# Patient Record
Sex: Female | Born: 1950 | Race: White | Hispanic: No | Marital: Married | Smoking: Never smoker
Health system: Southern US, Community
[De-identification: ages and names within clinical notes are randomized; demographics above are authoritative.]

## PROBLEM LIST (undated history)

## (undated) DIAGNOSIS — C801 Malignant (primary) neoplasm, unspecified: Secondary | ICD-10-CM

## (undated) DIAGNOSIS — K219 Gastro-esophageal reflux disease without esophagitis: Secondary | ICD-10-CM

## (undated) DIAGNOSIS — F32A Depression, unspecified: Secondary | ICD-10-CM

## (undated) DIAGNOSIS — Z87898 Personal history of other specified conditions: Secondary | ICD-10-CM

## (undated) DIAGNOSIS — E236 Other disorders of pituitary gland: Secondary | ICD-10-CM

## (undated) DIAGNOSIS — E119 Type 2 diabetes mellitus without complications: Principal | ICD-10-CM

## (undated) DIAGNOSIS — T7840XA Allergy, unspecified, initial encounter: Secondary | ICD-10-CM

## (undated) DIAGNOSIS — C4491 Basal cell carcinoma of skin, unspecified: Secondary | ICD-10-CM

## (undated) DIAGNOSIS — D649 Anemia, unspecified: Secondary | ICD-10-CM

## (undated) DIAGNOSIS — J449 Chronic obstructive pulmonary disease, unspecified: Secondary | ICD-10-CM

## (undated) DIAGNOSIS — F419 Anxiety disorder, unspecified: Secondary | ICD-10-CM

## (undated) DIAGNOSIS — N3281 Overactive bladder: Secondary | ICD-10-CM

## (undated) DIAGNOSIS — E039 Hypothyroidism, unspecified: Secondary | ICD-10-CM

## (undated) HISTORY — PX: ABDOMINAL HYSTERECTOMY: SHX81

## (undated) HISTORY — DX: Gastro-esophageal reflux disease without esophagitis: K21.9

## (undated) HISTORY — DX: Overactive bladder: N32.81

## (undated) HISTORY — DX: Hypothyroidism, unspecified: E03.9

## (undated) HISTORY — DX: Allergy, unspecified, initial encounter: T78.40XA

## (undated) HISTORY — DX: Chronic obstructive pulmonary disease, unspecified: J44.9

## (undated) HISTORY — DX: Other disorders of pituitary gland: E23.6

## (undated) HISTORY — DX: Personal history of other specified conditions: Z87.898

## (undated) HISTORY — DX: Basal cell carcinoma of skin, unspecified: C44.91

## (undated) HISTORY — DX: Type 2 diabetes mellitus without complications: E11.9

---

## 2003-10-22 ENCOUNTER — Other Ambulatory Visit: Payer: Self-pay

## 2003-10-25 HISTORY — PX: ABDOMINAL HYSTERECTOMY: SHX81

## 2003-10-25 HISTORY — PX: MOUTH SURGERY: SHX715

## 2004-06-07 ENCOUNTER — Encounter: Admission: RE | Admit: 2004-06-07 | Discharge: 2004-06-07 | Payer: Self-pay | Admitting: Neurosurgery

## 2004-09-27 ENCOUNTER — Encounter: Admission: RE | Admit: 2004-09-27 | Discharge: 2004-09-27 | Payer: Self-pay | Admitting: Neurosurgery

## 2004-10-30 ENCOUNTER — Encounter: Payer: Self-pay | Admitting: Family Medicine

## 2005-09-28 ENCOUNTER — Encounter: Admission: RE | Admit: 2005-09-28 | Discharge: 2005-09-28 | Payer: Self-pay | Admitting: Neurosurgery

## 2006-11-06 ENCOUNTER — Encounter: Admission: RE | Admit: 2006-11-06 | Discharge: 2006-11-06 | Payer: Self-pay | Admitting: Neurosurgery

## 2008-01-25 ENCOUNTER — Encounter: Payer: Self-pay | Admitting: Family Medicine

## 2008-01-26 ENCOUNTER — Encounter: Payer: Self-pay | Admitting: Family Medicine

## 2008-01-26 LAB — CONVERTED CEMR LAB
HDL: 59 mg/dL
LDL Cholesterol: 117 mg/dL

## 2008-07-07 ENCOUNTER — Encounter: Payer: Self-pay | Admitting: Family Medicine

## 2008-07-31 ENCOUNTER — Ambulatory Visit: Payer: Self-pay | Admitting: Family Medicine

## 2008-07-31 DIAGNOSIS — D352 Benign neoplasm of pituitary gland: Secondary | ICD-10-CM | POA: Insufficient documentation

## 2008-07-31 DIAGNOSIS — I10 Essential (primary) hypertension: Secondary | ICD-10-CM | POA: Insufficient documentation

## 2008-07-31 DIAGNOSIS — K029 Dental caries, unspecified: Secondary | ICD-10-CM | POA: Insufficient documentation

## 2008-07-31 DIAGNOSIS — J449 Chronic obstructive pulmonary disease, unspecified: Secondary | ICD-10-CM | POA: Insufficient documentation

## 2008-07-31 DIAGNOSIS — K219 Gastro-esophageal reflux disease without esophagitis: Secondary | ICD-10-CM | POA: Insufficient documentation

## 2008-07-31 DIAGNOSIS — J4489 Other specified chronic obstructive pulmonary disease: Secondary | ICD-10-CM | POA: Insufficient documentation

## 2008-07-31 DIAGNOSIS — D353 Benign neoplasm of craniopharyngeal duct: Secondary | ICD-10-CM

## 2008-07-31 DIAGNOSIS — J309 Allergic rhinitis, unspecified: Secondary | ICD-10-CM | POA: Insufficient documentation

## 2008-07-31 DIAGNOSIS — J45909 Unspecified asthma, uncomplicated: Secondary | ICD-10-CM | POA: Insufficient documentation

## 2008-07-31 DIAGNOSIS — E039 Hypothyroidism, unspecified: Secondary | ICD-10-CM | POA: Insufficient documentation

## 2008-07-31 HISTORY — DX: Benign neoplasm of pituitary gland: D35.2

## 2008-08-04 LAB — CONVERTED CEMR LAB
BUN: 11 mg/dL (ref 6–23)
CO2: 29 meq/L (ref 19–32)
Calcium: 9.6 mg/dL (ref 8.4–10.5)
Chloride: 101 meq/L (ref 96–112)
Creatinine, Ser: 0.7 mg/dL (ref 0.4–1.2)
Free T4: 0.8 ng/dL (ref 0.6–1.6)
GFR calc Af Amer: 112 mL/min
GFR calc non Af Amer: 92 mL/min
Glucose, Bld: 105 mg/dL — ABNORMAL HIGH (ref 70–99)
Potassium: 3.6 meq/L (ref 3.5–5.1)
Sodium: 139 meq/L (ref 135–145)
T4, Total: 10.8 ug/dL (ref 5.0–12.5)
TSH: 3.42 microintl units/mL (ref 0.35–5.50)

## 2008-08-11 ENCOUNTER — Encounter: Payer: Self-pay | Admitting: Family Medicine

## 2008-10-21 ENCOUNTER — Ambulatory Visit: Payer: Self-pay | Admitting: Unknown Physician Specialty

## 2008-10-28 ENCOUNTER — Ambulatory Visit: Payer: Self-pay | Admitting: Unknown Physician Specialty

## 2008-12-02 ENCOUNTER — Ambulatory Visit: Payer: Self-pay | Admitting: Unknown Physician Specialty

## 2009-01-14 ENCOUNTER — Ambulatory Visit: Payer: Self-pay | Admitting: Family Medicine

## 2009-07-17 ENCOUNTER — Encounter: Payer: Self-pay | Admitting: Family Medicine

## 2009-08-25 ENCOUNTER — Ambulatory Visit: Payer: Self-pay | Admitting: Family Medicine

## 2009-08-25 DIAGNOSIS — L259 Unspecified contact dermatitis, unspecified cause: Secondary | ICD-10-CM | POA: Insufficient documentation

## 2009-08-31 ENCOUNTER — Encounter: Payer: Self-pay | Admitting: Family Medicine

## 2010-03-29 ENCOUNTER — Ambulatory Visit: Payer: Self-pay | Admitting: Family Medicine

## 2010-03-29 DIAGNOSIS — M79609 Pain in unspecified limb: Secondary | ICD-10-CM | POA: Insufficient documentation

## 2010-03-31 ENCOUNTER — Ambulatory Visit: Payer: Self-pay | Admitting: Family Medicine

## 2010-06-11 ENCOUNTER — Encounter (INDEPENDENT_AMBULATORY_CARE_PROVIDER_SITE_OTHER): Payer: Self-pay | Admitting: *Deleted

## 2010-08-05 ENCOUNTER — Ambulatory Visit: Payer: Self-pay | Admitting: Internal Medicine

## 2010-08-05 LAB — CONVERTED CEMR LAB
Bilirubin Urine: NEGATIVE
Blood in Urine, dipstick: NEGATIVE
Glucose, Urine, Semiquant: NEGATIVE
Ketones, urine, test strip: NEGATIVE
Nitrite: NEGATIVE
Protein, U semiquant: NEGATIVE
Specific Gravity, Urine: 1.01
Urobilinogen, UA: 0.2
WBC Urine, dipstick: NEGATIVE
pH: 6.5

## 2010-08-06 ENCOUNTER — Encounter: Payer: Self-pay | Admitting: Family Medicine

## 2010-10-01 ENCOUNTER — Ambulatory Visit: Payer: Self-pay | Admitting: Internal Medicine

## 2010-10-01 LAB — CONVERTED CEMR LAB: Rapid Strep: NEGATIVE

## 2010-11-08 ENCOUNTER — Ambulatory Visit
Admission: RE | Admit: 2010-11-08 | Discharge: 2010-11-08 | Payer: Self-pay | Source: Home / Self Care | Attending: Family Medicine | Admitting: Family Medicine

## 2010-11-23 NOTE — Assessment & Plan Note (Signed)
Summary: ST,COUGH/CLE   Vital Signs:  Patient profile:   60 year old female Height:      60.25 inches Weight:      185.38 pounds BMI:     36.03 Temp:     100.0 degrees F oral Pulse rate:   77 / minute Pulse rhythm:   regular BP sitting:   150 / 90  Vitals Entered ByMelody Comas (October 01, 2010 3:14 PM) CC: sore throat, cough   History of Present Illness: CC: ST, cough  3d h/o ST, hoarseness and cough that doesn't stop.  Feels awful.  Today took turn for worse.  Today temp in clinic 100.  + nausea prior to feeling bad, diarrhea.  + dull frontal HA.  Takes zyrtec D.  Feels like needs to cough up sputum.  Sleeping in chair, not sleeping well.  No fevers/chills, abd pain, vomiting, rashes, myalgias, arthralgias.  + husband sick as well - Dr. Alphonsus Sias recently gave abx.  No smokers at home.  No h/o asthma.  gets bronchitis 1-2x/year.  Current Medications (verified): 1)  Levothyroxine Sodium 50 Mcg Tabs (Levothyroxine Sodium) .... Take One By Mouth Daily 2)  Bisoprolol-Hydrochlorothiazide 5-6.25 Mg Tabs (Bisoprolol-Hydrochlorothiazide) .... Take One By Mouth Daily 3)  Premarin 0.3 Mg Tabs (Estrogens Conjugated) .... Take One By Mouth Daily 4)  Detrol La 4 Mg Xr24h-Cap (Tolterodine Tartrate) .... Take One By Mouth Daily 5)  Vitamin D (Ergocalciferol) 50000 Unit Caps (Ergocalciferol) .... Take One Tablet Every Month  Allergies: 1)  ! 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 Talmage Nap, Deboraha Sprang, Endo (saw once) [facility]  Social History: Last updated: 07/31/2008 Marital Status: Married, Pharmacist, hospital Children:  Occupation: [employer], [employer]    Review of Systems       per HPI  Physical Exam  General:  overweight appearing female in NAD, congested and  cough Head:  no maxillary sinus ttp  Eyes:  No corneal or conjunctival inflammation noted. EOMI. Perrla. Ears:  clear B TMs, clear ext canals. Nose:  no external deformity and nasal discharge mucosal pallor.   Mouth:  mmm, pharyngeal erythema no exudates.   Neck:  no cervical or supraclavicular lymphadenopathy Lungs:  Normal respiratory effort, chest expands symmetrically. Lungs are clear to auscultation, no crackles or wheezes. Heart:  Normal rate and regular rhythm. S1 and S2 normal without gallop, murmur, click, rub or other extra sounds. Pulses:  2+ rad pulses Extremities:  no edema   Impression & Recommendations:  Problem # 1:  URI (ICD-465.9) supportive care as per instructions.  red flags to return discussed. to call next week if not better with below measures for consideration of a zpack.  The following medications were removed from the medication list:    Promethazine Hcl 25 Mg Tabs (Promethazine hcl) .[name] Kitchen... Take one by mouth as needed nausea Her updated medication list for this problem includes:    Tussionex Pennkinetic Er 10-8 Mg/61ml Lqcr (Hydrocod polst-chlorphen polst) ..... One teaspoon two times a day as needed cough  Complete Medication List: 1)  Levothyroxine Sodium 50 Mcg Tabs (Levothyroxine sodium) .... Take one by mouth daily 2)  Bisoprolol-hydrochlorothiazide 5-6.25 Mg Tabs (Bisoprolol-hydrochlorothiazide) .... Take one by mouth daily 3)  Premarin 0.3 Mg Tabs (Estrogens conjugated) .... Take one by mouth daily 4)  Detrol La 4 Mg Xr24h-cap (Tolterodine tartrate) .[name] KitchenMarland KitchenMarland Kitchen  Take one by mouth daily 5)  Vitamin D (ergocalciferol) 50000 Unit Caps (Ergocalciferol) .... Take one tablet every month 6)  Tussionex Pennkinetic Er 10-8 Mg/39ml Lqcr (Hydrocod polst-chlorphen polst) .... One teaspoon two times a day as needed cough  Patient Instructions: 1)  Sounds like you have a viral upper respiratory infection. 2)  Antibiotics are not needed for this.  Viral infections usually  take 7-10 days to resolve.  The cough can last 4 weeks to go away. 3)  Guaifenesin IR 400mg  one pill in am and one at noon, must take wit hplenty of fluid to mobilize mucous out.[name] Kitchen 4)  Nasal saline for dry nose. 5)  Ibuprofen for throat inflammation. 6)  Use medication as prescribed: tussionex 7)  Please return if you are not improving as expected, or if you have high fevers (>101.5) or difficulty swallowing. 8)  Call clinic with questions.  Pleasure to see you today!  Prescriptions: Sandria Senter ER 10-8 MG/5ML LQCR (HYDROCOD POLST-CHLORPHEN POLST) one teaspoon two times a day as needed cough  #100cc x 0   Entered and Authorized by:   Eustaquio Boyden  MD   Signed by:   Eustaquio Boyden  MD on 10/01/2010   Method used:   Print then Give to Patient   RxID:   [id number]    Orders Added: 1)  Est. Patient Level III [27741]    Current Allergies (reviewed today): ! SULFA  Laboratory Results  Date/Time Received: October 01, 2010 3:16 PM  Date/Time Reported: October 01, 2010 3:16 PM  Other Tests  Rapid Strep: negative

## 2010-11-23 NOTE — Assessment & Plan Note (Signed)
Summary: ? UTI   Vital Signs:  Patient profile:   60 year old female Weight:      185.25 pounds Temp:     98.4 degrees F oral Pulse rate:   80 / minute Pulse rhythm:   regular BP sitting:   144 / 80  (left arm) Cuff size:   large  Vitals Entered By: Selena Batten Dance CMA Duncan Dull) (August 05, 2010 3:47 PM) CC: ? UTI/generally feels "awful" Comments Experiencing urinary frequency. Had 3 left over Cipro pills. Took those and feels generally worse.   History of Present Illness: CC: ?UTI  Frequency started 2 nights ago.  No dysuria.  No urgency.  + stomach upset, nausea with anything she eats.  + pressure.  No vag discharge.  No back pain, fevers/chills.  had 3 cipro at home, took for last 1 1/2 days.  GYN prescribes cipro for "emergency UTIs", gets 2-3/year.  No h/o kidney stones, hasn't seen any blood in urine.  Current Medications (verified): 1)  Levothyroxine Sodium 50 Mcg Tabs (Levothyroxine Sodium) .... Take One By Mouth Daily 2)  Bisoprolol-Hydrochlorothiazide 5-6.25 Mg Tabs (Bisoprolol-Hydrochlorothiazide) .... Take One By Mouth Daily 3)  Premarin 0.3 Mg Tabs (Estrogens Conjugated) .... Take One By Mouth Daily 4)  Detrol La 4 Mg Xr24h-Cap (Tolterodine Tartrate) .... Take One By Mouth Daily 5)  Vitamin D (Ergocalciferol) 50000 Unit Caps (Ergocalciferol) .... Take One Tablet Every Month  Allergies: 1)  ! 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 Lungs are clear to auscultation, no crackles or wheezes. Heart:  Normal rate and regular rhythm. S1 and S2 normal without gallop, murmur, click, rub or other extra sounds. Abdomen:  Bowel sounds positive,abdomen soft and without masses, organomegaly or hernias noted.  no CVA tenderness.  + suprapubic pressure on palpation. Extremities:  no edema   Impression & Recommendations:  Problem # 1:  UTI (ICD-599.0) UA clear but s/p 3 cipro pills.  sent in rpt cipro course.  Encouraged to push clear liquids, get enough rest, and take acetaminophen as needed. To be seen in 10 days if no improvement, sooner if worse.  discussed cons of taking abx prior to UA/UCx.  pt says uses cipro from GYN for emergencies.  asked to come in to clinic if during weekday for evaluation prior to starting abx.  Her updated medication list for this problem includes:    Detrol La 4 Mg Xr24h-cap (Tolterodine tartrate) .[name] Kitchen... Take one by mouth daily    Ciprofloxacin Hcl 500 Mg Tabs (Ciprofloxacin hcl) .[name] Kitchen... Take one pill twice daily for 7 dyas  Orders: UA Dipstick w/o Micro (manual) (44010) T-Culture, Urine (27253-66440)  Complete Medication List: 1)  Levothyroxine Sodium 50 Mcg Tabs (Levothyroxine sodium) .... Take one by mouth daily 2)  Bisoprolol-hydrochlorothiazide 5-6.25 Mg Tabs (Bisoprolol-hydrochlorothiazide) .... Take one by mouth daily 3)  Premarin 0.3 Mg Tabs (Estrogens conjugated) .... Take one by mouth daily 4)  Detrol La 4 Mg Xr24h-cap (Tolterodine tartrate) .... Take one by mouth daily 5)  Vitamin  D (ergocalciferol) 50000 Unit Caps (Ergocalciferol) .... Take one tablet every month 6)  Ciprofloxacin Hcl 500 Mg Tabs (Ciprofloxacin hcl) .... Take one pill twice daily for 7 dyas 7)  Promethazine Hcl 25 Mg Tabs (Promethazine hcl) .... Take one by mouth as needed nausea  Patient Instructions: 1)  Finish course of cipro.  med sent to pharmacy. 2)  Push fluids to help you feel better, consider  cranberry juice for bladder health. 3)  Please let us know if not improving as expected. 4)  phenergan for nausea likely from cipro. 5)  Urine culture sent in today.  we will call you with results at [phone] Prescriptions: PROMETHAZINE HCL 25 MG TABS (PROMETHAZINE HCL) take one by mouth as needed nausea  #20 x 0   Entered and Authorized by:   Eustaquio Boyden  MD   Signed by:   Eustaquio Boyden  MD on 08/05/2010   Method used:   Electronically to        [facility]       [address]       [place], [place]  [address]       Ph: [phone]       Fax: [phone]   RxID:   [id number] CIPROFLOXACIN HCL 500 MG TABS (CIPROFLOXACIN HCL) take one pill twice daily for 7 dyas  #14 x 0   Entered and Authorized by:   Eustaquio Boyden  MD   Signed by:   Eustaquio Boyden  MD on 08/05/2010   Method used:   Electronically to        [facility]       [address]       [place], [place]  [address]       Ph: [phone]       Fax: [phone]   RxID:   [id number]   Current Allergies (reviewed today): ! SULFA  Laboratory Results   Urine Tests  Date/Time Received: August 05, 2010 3:50 PM  Date/Time Reported: August 05, 2010 3:50 PM    Routine Urinalysis   Color: lt. yellow Appearance: Clear Glucose: negative   (Normal Range: Negative) Bilirubin: negative   (Normal Range: Negative) Ketone: negative   (Normal Range: Negative) Spec. Gravity: 1.010   (Normal Range: 1.003-1.035) Blood: negative   (Normal Range: Negative) pH: 6.5   (Normal Range: 5.0-8.0) Protein: negative   (Normal Range: Negative) Urobilinogen: 0.2   (Normal Range: 0-1) Nitrite: negative   (Normal Range: Negative) Leukocyte Esterace: negative   (Normal Range: Negative)

## 2010-11-23 NOTE — Assessment & Plan Note (Signed)
Summary: Foot pain /lsf   Vital Signs:  Patient profile:   60 year old female Height:      60.25 inches Weight:      182.8 pounds BMI:     35.53 Temp:     98.3 degrees F oral Pulse rate:   70 / minute Pulse rhythm:   regular BP sitting:   110 / 74  (left arm) Cuff size:   large  Vitals Entered By: Benny Lennert CMA Duncan Dull) (March 29, 2010 12:36 PM)  History of Present Illness: Chief complaint Foot pain  61 year old female:  R lateral foot pain fell off of step now with pain at 5th mt some swelling no ecchymosis   REVIEW OF SYSTEMS  GEN: No systemic complaints, no fevers, chills, sweats, or other acute illnesses MSK: Detailed in the HPI GI: tolerating PO intake without difficulty Neuro: No numbness, parasthesias, or tingling associated. Otherwise the pertinent positives of the ROS are noted above.    GEN: Well-developed,well-nourished,in no acute distress; alert,appropriate and cooperative throughout examination HEENT: Normocephalic and atraumatic without obvious abnormalities. No apparent alopecia or balding. Ears, externally no deformities PULM: Breathing comfortably in no respiratory distress EXT: No clubbing, cyanosis, or edema PSYCH: Normally interactive. Cooperative during the interview. Pleasant. Friendly and conversant. Not anxious or depressed appearing. Normal, full affect.   R foot: TTP at prox 5th, intact peroneals with good str. no swelling or bruishing. NT throughtout the rest of bony foot anatomy  Allergies: 1)  ! Sulfa  Past History:  Past medical, surgical, family and social histories (including risk factors) reviewed, and no changes noted (except as noted below).  Past Medical History: Reviewed history from 08/11/2008 and no changes required. 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 Hysterectomy   Refused Colonopscopy  Family History: Reviewed history from 07/31/2008 and no changes required. Family History of Alcoholism/Addiction Family History High cholesterol Family History Hypertension Family History Thyroid disease  Family History of Sudden Death, cousin < 50: no clear cause In 20's.  Social History: Reviewed history from 07/31/2008 and no changes required. Marital Status: Married, Chaniya Genter Children:  Occupation: [employer], [employer]     Impression & Recommendations:  Problem # 1:  FOOT PAIN, RIGHT (ICD-729.5) Assessment New clinical concern for 5th MT avulsion fx place in post-op shoe  at the least bad bone bruise  check xr on wed.  Future Orders: Radiology other (Radiology Other) ... 03/31/2010  Complete Medication List: 1)  Levothyroxine Sodium 50 Mcg Tabs (Levothyroxine sodium) .... Take one by mouth daily 2)  Bisoprolol-hydrochlorothiazide 5-6.25 Mg Tabs (Bisoprolol-hydrochlorothiazide) .... Take one by mouth daily 3)  Premarin 0.3 Mg Tabs (Estrogens conjugated) .... Take one by mouth daily 4)  Detrol La 4 Mg Xr24h-cap (Tolterodine tartrate) .... Take one by mouth daily 5)  Vitamin D (ergocalciferol) 50000 Unit Caps (Ergocalciferol) .... Take one tablet every month  Patient Instructions: 1)  set up xray appointment for wednesday  Current Allergies (reviewed today): ! SULFA

## 2010-11-23 NOTE — Miscellaneous (Signed)
Summary: flu vaccine at [facility]  Clinical Lists Changes  Observations: Added new observation of FLU VAX: Historical (07/12/2010 17:04)      Influenza Immunization History:    Influenza # 1:  Fluvax 3+ (07/12/2010) Received flu vaccine at [facility], [address] in [place].           Lowella Petties CMA  June 11, 2010 5:04 PM

## 2010-11-25 NOTE — Assessment & Plan Note (Signed)
Summary: CHEST CONGESTION, COUGH   Vital Signs:  Patient profile:   60 year old female Height:      60.25 inches Weight:      181.25 pounds BMI:     35.23 Temp:     98.5 degrees F oral Pulse rate:   80 / minute Pulse rhythm:   regular BP sitting:   144 / 72  (left arm) Cuff size:   large  Vitals Entered By: Delilah Shan CMA Anahla Bevis Dull) (November 08, 2010 2:02 PM) CC: Cough, congestion   History of Present Illness: Seen prev in clinic.  10/03/10 to UC and started on zpack and tessalon.  Cough continues to this point.  chest hurts from coughing.  No FNAVD.  No diarrhea.  Occ chills- possible menopausal symptoms.  No sputum. Clear rhinorrhea now.  Prev with discharge that was colored.    "The cough eased off and then increased over the weekend."  Cough more when supine.    H/o similar symptoms previously.  Was prev told that she had asthma that flared with prev URI infection/symptoms.    Allergies: 1)  ! Sulfa  Social History: Reviewed history from 07/31/2008 and no changes required. Marital Status: Married, Gianelle Mccaul Children: 1 daughter Occupation: [employer], [employer]termin[employer] nonsmoker  Review of Systems       See HPI.  Otherwise negative.    Physical Exam  General:  no apparent distress normocephalic atraumatic mucous membranes moist tm wnl x2 w/o acute changes nasal exam w/o erythema op wnl neck supple regular rate and rhythm clear to auscultation bilaterally    Impression & Recommendations:  Problem # 1:  COUGH (ICD-786.2)  Possibe post viral +/- asthma.  Will try SABA and call back as needed.  Consider advair if symptoms persist.  No indication for further antibiotics at this point.  d/w patient and she understood, agreed.  follow up as needed.  Nontoxic.   Orders: Prescription Created Electronically [id number])  Complete Medication List: 1)  Levothyroxine Sodium 50 Mcg Tabs (Levothyroxine sodium) .... Take one by mouth daily 2)   Bisoprolol-hydrochlorothiazide 5-6.25 Mg Tabs (Bisoprolol-hydrochlorothiazide) .... Take one by mouth daily 3)  Premarin 0.3 Mg Tabs (Estrogens conjugated) .... Take one by mouth daily 4)  Detrol La 4 Mg Xr24h-cap (Tolterodine tartrate) .... Take one by mouth daily 5)  Vitamin D (ergocalciferol) 50000 Unit Caps (Ergocalciferol) .... Take one tablet every month 6)  Ventolin Hfa 108 (90 Base) Mcg/act Aers (Albuterol sulfate) .... 2 puffs every 4 hours as needed for cough  Patient Instructions: 1)  I would use the inhaler every 4 hours as needed for cough. If you continue to need the inhaler, let us know.  We can consider using a med like advair at that point.  I think this will gradually resolve.  Take care.  Prescriptions: VENTOLIN HFA 108 (90 BASE) MCG/ACT AERS (ALBUTEROL SULFATE) 2 puffs every 4 hours as needed for cough  #1 x 1   Entered and Authorized by:   Crawford Givens MD   Signed by:   Crawford Givens MD on 11/08/2010   Method used:   Electronically to        [facility]       [address]       [place], [place]  [address]       Ph: [phone]       Fax: [phone]   RxID:   [id number]  Orders Added: 1)  Prescription Created Electronically [[id number]] 2)  Est. Patient Level III [16109]    Current Allergies (reviewed today): ! SULFA

## 2011-02-04 ENCOUNTER — Ambulatory Visit (INDEPENDENT_AMBULATORY_CARE_PROVIDER_SITE_OTHER): Payer: BLUE CROSS/BLUE SHIELD | Admitting: Family Medicine

## 2011-02-04 ENCOUNTER — Encounter: Payer: Self-pay | Admitting: Family Medicine

## 2011-02-04 VITALS — BP 120/62 | HR 86 | Temp 97.9°F | Ht 60.25 in | Wt 181.8 lb

## 2011-02-04 DIAGNOSIS — R300 Dysuria: Secondary | ICD-10-CM

## 2011-02-04 DIAGNOSIS — N390 Urinary tract infection, site not specified: Secondary | ICD-10-CM

## 2011-02-04 DIAGNOSIS — N318 Other neuromuscular dysfunction of bladder: Secondary | ICD-10-CM

## 2011-02-04 DIAGNOSIS — N3281 Overactive bladder: Secondary | ICD-10-CM

## 2011-02-04 LAB — POCT URINALYSIS DIPSTICK
Bilirubin, UA: NEGATIVE
Blood, UA: NEGATIVE
Glucose, UA: NEGATIVE
Ketones, UA: NEGATIVE
Nitrite, UA: NEGATIVE
Spec Grav, UA: 1.02
Urobilinogen, UA: NEGATIVE
pH, UA: 7

## 2011-02-04 MED ORDER — NITROFURANTOIN MONOHYD MACRO 100 MG PO CAPS: 100.0000 mg | ORAL_CAPSULE | Freq: Two times a day (BID) | ORAL | Status: AC

## 2011-02-04 MED ORDER — CEFTRIAXONE SODIUM 1 G IJ SOLR
1.0000 g | Freq: Once | INTRAMUSCULAR | Status: AC
  Administered 2011-02-04: 1 g via INTRAMUSCULAR

## 2011-02-04 NOTE — Progress Notes (Signed)
60 year old female:  UTI, seen last week at [facility]. Feeling better until a couple of days ago - on Cipro  Three or thour a year.  Woke up in the wee hours of the morning.  Now with pain, urgency. Dysuria. Burning.  Overactive bladder. Has also been on vesicare -- compared to Detrol.  Improved symptoms at baseline.  The PMH, PSH, Social History, Family History, Medications, and allergies have been reviewed in [facility], and have been updated if relevant.  GEN: WDWN, NAD, Non-toxic, A & O x 3 HEENT: Atraumatic, Normocephalic. Neck supple. No masses, No LAD. Ears and Nose: No external deformity. CV: RRR, No M/G/R. No JVD. No thrill. No extra heart sounds. PULM: CTA B, no wheezes, crackles, rhonchi. No retractions. No resp. distress. No accessory muscle use. ABD: S, NT, ND, +BS. No rebound tenderness. No HSM. +suprapubic tenderness EXTR: No c/c/e NEURO Normal gait.  PSYCH: Normally interactive. Conversant. Not depressed or anxious appearing.  Calm demeanor.

## 2011-02-05 DIAGNOSIS — N3281 Overactive bladder: Secondary | ICD-10-CM | POA: Insufficient documentation

## 2011-03-17 ENCOUNTER — Ambulatory Visit: Payer: Self-pay | Admitting: Urology

## 2011-06-02 ENCOUNTER — Telehealth: Payer: Self-pay | Admitting: *Deleted

## 2011-06-02 MED ORDER — ZOSTER VACCINE LIVE 19400 UNT/0.65ML ~~LOC~~ SOLR: 0.6500 mL | Freq: Once | SUBCUTANEOUS | Status: DC

## 2011-06-02 NOTE — Telephone Encounter (Signed)
Patient would like to get the zostavax. She is asking if she can pick up written rx to take to pharmacy. She has already checked with her insurance.

## 2011-06-02 NOTE — Telephone Encounter (Signed)
Patient advised and rx left up front for pick up

## 2011-06-02 NOTE — Telephone Encounter (Signed)
She can - i have written for it. Usually, this is not done until age 60 - most insurances do not start covering it until that age.   Make sure she understands that.

## 2012-02-27 ENCOUNTER — Ambulatory Visit (INDEPENDENT_AMBULATORY_CARE_PROVIDER_SITE_OTHER): Payer: BLUE CROSS/BLUE SHIELD | Admitting: Family Medicine

## 2012-02-27 ENCOUNTER — Encounter: Payer: Self-pay | Admitting: Family Medicine

## 2012-02-27 VITALS — BP 140/70 | HR 99 | Temp 98.4°F | Ht 59.0 in | Wt 186.0 lb

## 2012-02-27 DIAGNOSIS — F430 Acute stress reaction: Secondary | ICD-10-CM

## 2012-02-27 DIAGNOSIS — I10 Essential (primary) hypertension: Secondary | ICD-10-CM

## 2012-02-27 DIAGNOSIS — F438 Other reactions to severe stress: Secondary | ICD-10-CM

## 2012-02-27 MED ORDER — CLONAZEPAM 0.5 MG PO TABS: 0.5000 mg | ORAL_TABLET | Freq: Two times a day (BID) | ORAL | Status: DC

## 2012-02-27 MED ORDER — CITALOPRAM HYDROBROMIDE 20 MG PO TABS: 20.0000 mg | ORAL_TABLET | Freq: Every day | ORAL | Status: DC

## 2012-02-27 MED ORDER — AMLODIPINE BESYLATE 5 MG PO TABS: 5.0000 mg | ORAL_TABLET | Freq: Every day | ORAL | Status: DC

## 2012-02-27 NOTE — Patient Instructions (Signed)
F/u 3 weeks  Get BP machine Check BP every other day, same time of day

## 2012-02-27 NOTE — Progress Notes (Signed)
Patient Name: Robin Bass Date of Birth: 03/28/1951 Age: 61 y.o. Medical Record Number: [id number] Gender: female Date of Encounter: 02/27/2012  History of Present Illness:  Robin Bass is a 61 y.o. very pleasant female patient who presents with the following:  Pleasant patient, whose family I know fairly well. She presents in acute followup for elevated blood pressures at her gynecologist office. Over the last several months, her blood pressures have been elevated, and her blood medications have been altered, initially with elevating her Ziac dose. Subsequently, she was placed on some Accuretic, and then that dose was increased so that now it is at a maximal dose. Her elevated blood pressures have persisted.  Dr. Arvella Merles is her gynecologist - wants note.  For about twelve weeks, went in for a BP check when had her pap smear, 190/95. Doubled BP meds, EKG was normal. (doubled Ziac)  Was running 180/90, going back in intervals of 2 weeks.  166/84.  Went back Thursday and was up in the 160's/80's.  BP machine at home. Old and batteries are dead.  165/80.  A great deal of stress at home. Niece is on some drugs. Family can't talk anything through.  Niece addicted to heroine -- found out since Christmas. Now a lot other going on.  To cope: tryig to help cope, but poor communication in family. Family, daughter, husband, arguring with sister. Daughter won't have anything to do with her grandparents.  The patient appears acutely distraught and in a very different state compared to all prior interactions with her. She is crying in the examination room.  Past Medical History, Surgical History, Social History, Family History, Problem List, Medications, and Allergies have been reviewed and updated if relevant.  Review of Systems: As above. Great deal of psychosocial stressors. Some occasional headaches with elevated blood pressures. No chest pain or shortness of breath  Physical  Examination: Filed Vitals:   02/27/12 1153  BP: 140/70  Pulse: 99  Temp: 98.4 F (36.9 C)  TempSrc: Oral  Height: 4\' 11"  (1.499 m)  Weight: 186 lb (84.369 kg)  SpO2: 97%    Body mass index is 37.57 kg/(m^2).   GEN: WDWN, NAD, Non-toxic, A & O x 3 HEENT: Atraumatic, Normocephalic. Neck supple. No masses, No LAD. Ears and Nose: No external deformity. CV: RRR, No M/G/R. No JVD. No thrill. No extra heart sounds. PULM: CTA B, no wheezes, crackles, rhonchi. No retractions. No resp. distress. No accessory muscle use. EXTR: No c/c/e NEURO Normal gait.  PSYCH: anxious, crying, labile  Assessment and Plan:  1. HYPERTENSION  amLODipine (NORVASC) 5 MG tablet  2. Acute stress reaction  clonazePAM (KLONOPIN) 0.5 MG tablet, citalopram (CELEXA) 20 MG tablet, Ambulatory referral to Psychology   Unstable blood pressures. 160/80 on my reading today. Some of this may be an acute stress induced elevation, nevertheless her blood pressure is unstable. Add Norvasc 5 mg. Recheck in 3 weeks.  Acute stress and grief reaction in the home. For now, I think that she needs an acute stabilization. We will start with some scheduled Klonopin, one half tablet to one tablet by mouth twice a day scheduled and start some Celexa. We are also going to consult Dr. Evalina Field for psychological counselling.  Recheck in 3 weeks.  Cc: Dr. Arvella Merles  Orders Today: Orders Placed This Encounter  Procedures  . Ambulatory referral to Psychology    Referral Priority:  Routine    Referral Type:  Psychiatric    Referral Reason:  Specialty Services Required    Requested Specialty:  Psychology    Number of Visits Requested:  1    Medications Today: Meds ordered this encounter  Medications  . quinapril-hydrochlorothiazide (ACCURETIC) 20-25 MG per tablet    Sig: Take 1 tablet by mouth Daily.  [name] Kitchen amLODipine (NORVASC) 5 MG tablet    Sig: Take 1 tablet (5 mg total) by mouth daily.    Dispense:  30 tablet    Refill:   3  . clonazePAM (KLONOPIN) 0.5 MG tablet    Sig: Take 1 tablet (0.5 mg total) by mouth 2 (two) times daily.    Dispense:  60 tablet    Refill:  0  . citalopram (CELEXA) 20 MG tablet    Sig: Take 1 tablet (20 mg total) by mouth daily.    Dispense:  30 tablet    Refill:  3

## 2012-03-20 ENCOUNTER — Ambulatory Visit: Payer: BLUE CROSS/BLUE SHIELD | Admitting: Psychology

## 2012-03-28 ENCOUNTER — Ambulatory Visit (INDEPENDENT_AMBULATORY_CARE_PROVIDER_SITE_OTHER): Payer: BLUE CROSS/BLUE SHIELD | Admitting: Family Medicine

## 2012-03-28 ENCOUNTER — Encounter: Payer: Self-pay | Admitting: Family Medicine

## 2012-03-28 VITALS — BP 124/80 | HR 88 | Temp 98.5°F | Wt 183.0 lb

## 2012-03-28 DIAGNOSIS — I10 Essential (primary) hypertension: Secondary | ICD-10-CM

## 2012-03-28 DIAGNOSIS — F329 Major depressive disorder, single episode, unspecified: Secondary | ICD-10-CM

## 2012-03-28 DIAGNOSIS — F32A Depression, unspecified: Secondary | ICD-10-CM

## 2012-03-28 MED ORDER — AMLODIPINE BESYLATE 10 MG PO TABS: 10.0000 mg | ORAL_TABLET | Freq: Every day | ORAL | Status: DC
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
## 2012-03-31 DIAGNOSIS — F32A Depression, unspecified: Secondary | ICD-10-CM | POA: Insufficient documentation

## 2012-03-31 DIAGNOSIS — F329 Major depressive disorder, single episode, unspecified: Secondary | ICD-10-CM | POA: Insufficient documentation

## 2012-05-02 ENCOUNTER — Ambulatory Visit (INDEPENDENT_AMBULATORY_CARE_PROVIDER_SITE_OTHER): Payer: BLUE CROSS/BLUE SHIELD | Admitting: Family Medicine

## 2012-05-02 ENCOUNTER — Encounter: Payer: Self-pay | Admitting: Family Medicine

## 2012-05-02 VITALS — BP 140/72 | HR 105 | Temp 98.8°F | Ht 59.0 in | Wt 184.8 lb

## 2012-05-02 DIAGNOSIS — R5381 Other malaise: Secondary | ICD-10-CM

## 2012-05-02 DIAGNOSIS — R5383 Other fatigue: Secondary | ICD-10-CM

## 2012-05-02 DIAGNOSIS — R059 Cough, unspecified: Secondary | ICD-10-CM

## 2012-05-02 DIAGNOSIS — R0989 Other specified symptoms and signs involving the circulatory and respiratory systems: Secondary | ICD-10-CM

## 2012-05-02 DIAGNOSIS — F329 Major depressive disorder, single episode, unspecified: Secondary | ICD-10-CM

## 2012-05-02 DIAGNOSIS — R05 Cough: Secondary | ICD-10-CM

## 2012-05-02 DIAGNOSIS — R609 Edema, unspecified: Secondary | ICD-10-CM

## 2012-05-02 DIAGNOSIS — R0600 Dyspnea, unspecified: Secondary | ICD-10-CM

## 2012-05-02 DIAGNOSIS — F32A Depression, unspecified: Secondary | ICD-10-CM

## 2012-05-02 MED ORDER — FLUOXETINE HCL 20 MG PO TABS: 20.0000 mg | ORAL_TABLET | Freq: Every day | ORAL | Status: DC

## 2012-05-02 MED ORDER — LOSARTAN POTASSIUM-HCTZ 100-25 MG PO TABS: 1.0000 | ORAL_TABLET | Freq: Every day | ORAL | Status: DC

## 2012-05-02 MED ORDER — LOSARTAN POTASSIUM-HCTZ 100-12.5 MG PO TABS: 1.0000 | ORAL_TABLET | Freq: Every day | ORAL | Status: DC

## 2012-05-02 NOTE — Patient Instructions (Addendum)
Cut the citalopram down to 1/2 a tablet for the next week.  Then OK to start Prozac

## 2012-05-02 NOTE — Progress Notes (Signed)
Nature conservation officer at [facility] [address] [place] [address] Phone: [phone] Fax: [phone]  Date:  05/02/2012   Name:  Robin Bass   DOB:  May 31, 1951   MRN:  [id number]  PCP:  Hannah Beat, MD    Chief Complaint: Follow-up and Depression   History of Present Illness:  Robin Bass is a 61 y.o. very pleasant female patient who presents with the following:  Multiple issues today. We have been treating her with the last few months for some depression, which is improving, and is much better compared to her initial. She now has some excessive fatigue and is very sleepy much of the day. She generally gets a poor sleep nightly, and this is not new. She'll wake up multiple times having to go the bathroom. Historically, she is often take a nap during the day. Now she is sleeping upwards of 12 hours a day. She has not been taking any Klonopin.  Cough: no coughing. Has had some allergies and itchy eyes. Occ rare heartburn. She associates this with the onset of taking her new blood pressure medication.  Swelling, she is having some lower extremity mild edema. Rare shortness of breath.  Sleeping all the time.  From a depression standpoint, she is doing and has been doing quite better than before, but she still has some decreased interest in doing things. No SI or HI  Past Medical History, Surgical History, Social History, Family History, Problem List, Medications, and Allergies have been reviewed and updated if relevant.  Current Outpatient Prescriptions on File Prior to Visit  Medication Sig Dispense Refill  . amLODipine (NORVASC) 10 MG tablet Take 1 tablet (10 mg total) by mouth daily.  90 tablet  3  . citalopram (CELEXA) 20 MG tablet Take 1 tablet (20 mg total) by mouth daily.  30 tablet  3  . estrogens, conjugated, (PREMARIN) 0.3 MG tablet Take 0.3 mg by mouth daily. Take daily for 21 days then do not take for 7 days.       [name] Kitchen levothyroxine (SYNTHROID, LEVOTHROID)  50 MCG tablet Take 50 mcg by mouth daily.        . quinapril-hydrochlorothiazide (ACCURETIC) 20-25 MG per tablet Take 1 tablet by mouth Daily.      . clonazePAM (KLONOPIN) 0.5 MG tablet Take 1 tablet (0.5 mg total) by mouth 2 (two) times daily.  60 tablet  0    Review of Systems: As above, no fever, chills, or other probs  Physical Examination: Filed Vitals:   05/02/12 1516  BP: 140/72  Pulse: 105  Temp: 98.8 F (37.1 C)   Filed Vitals:   05/02/12 1516  Height: 4\' 11"  (1.499 m)  Weight: 184 lb 12 oz (83.802 kg)   Body mass index is 37.31 kg/(m^2). Ideal Body Weight: Weight in (lb) to have BMI = 25: 123.5    GEN: WDWN, NAD, Non-toxic, A & O x 3 HEENT: Atraumatic, Normocephalic. Neck supple. No masses, No LAD. Ears and Nose: No external deformity. CV: RRR, No M/G/R. No JVD. No thrill. No extra heart sounds. PULM: CTA B, no wheezes, crackles, rhonchi. No retractions. No resp. distress. No accessory muscle use. EXTR: No c/c/e NEURO Normal gait.  PSYCH: Normally interactive. Conversant. Not depressed or anxious appearing.  Affect is somewhat flat.  EKG / Xrays / Labs: None available at the time of encounter.  Assessment and Plan:  1. Edema  Basic metabolic panel, CBC with Differential, Hepatic function panel  2.  Dyspnea  Brain natriuretic peptide  3. Fatigue  TSH  4. Depression      One depression has been improving, but concerned that Celexa may be causing her more drowsiness. We'll taper her off of this, and start Prozac.  Patient is having some edema and dyspnea. We'll change and check her metabolic panels, liver function, as well as a BNP.  Overall fatigue, suspect some of this is being influenced by her dramatic overactive bladder symptoms. She has been taking 2 medications intermittently for this, but she does not recall the name. It is possible that one of these could be influencing it as well. I asked her to call back with the names.  Suspected the cough as an ACE  inhibitor cough. We are going to discontinue this and change to hyzaar  Hannah Beat, MD

## 2012-05-03 ENCOUNTER — Telehealth: Payer: Self-pay

## 2012-05-03 LAB — CBC WITH DIFFERENTIAL/PLATELET
Basophils Relative: 0.4 % (ref 0.0–3.0)
HCT: 38.9 % (ref 36.0–46.0)
Hemoglobin: 13 g/dL (ref 12.0–15.0)
Lymphocytes Relative: 25.4 % (ref 12.0–46.0)
Lymphs Abs: 2.9 10*3/uL (ref 0.7–4.0)
MCHC: 33.5 g/dL (ref 30.0–36.0)
MCV: 77.4 fl — ABNORMAL LOW (ref 78.0–100.0)
Monocytes Relative: 3.5 % (ref 3.0–12.0)
Neutro Abs: 8 10*3/uL — ABNORMAL HIGH (ref 1.4–7.7)
Neutrophils Relative %: 69.6 % (ref 43.0–77.0)
RBC: 5.02 Mil/uL (ref 3.87–5.11)
RDW: 15.7 % — ABNORMAL HIGH (ref 11.5–14.6)
WBC: 11.6 10*3/uL — ABNORMAL HIGH (ref 4.5–10.5)

## 2012-05-03 LAB — HEPATIC FUNCTION PANEL
ALT: 14 U/L (ref 0–35)
AST: 22 U/L (ref 0–37)
Albumin: 3.9 g/dL (ref 3.5–5.2)
Alkaline Phosphatase: 96 U/L (ref 39–117)
Bilirubin, Direct: 0.1 mg/dL (ref 0.0–0.3)
Total Bilirubin: 0.1 mg/dL — ABNORMAL LOW (ref 0.3–1.2)
Total Protein: 8.2 g/dL (ref 6.0–8.3)

## 2012-05-03 LAB — BASIC METABOLIC PANEL
Calcium: 9.6 mg/dL (ref 8.4–10.5)
Chloride: 95 mEq/L — ABNORMAL LOW (ref 96–112)
GFR: 79.08 mL/min (ref >60.00)
Glucose, Bld: 86 mg/dL (ref 70–99)
Sodium: 136 mEq/L (ref 135–145)

## 2012-05-03 LAB — BRAIN NATRIURETIC PEPTIDE: Pro B Natriuretic peptide (BNP): 18 pg/mL (ref 0.0–100.0)

## 2012-05-03 NOTE — Telephone Encounter (Signed)
Robin Bass with [facility] received Losartan HCTZ 100-12.5 mg and then received Losartan HCTZ 100-25 mg. [facility] request clarification of which med to give pt. Pt was taking Quinapril HCTZ 20-25 prior to 05/02/12 visit.Please advise.

## 2012-05-03 NOTE — Telephone Encounter (Signed)
D/c quinapril  Start losartan 100-25   Sorry for confusion

## 2012-05-03 NOTE — Telephone Encounter (Signed)
Pharmacy advised  

## 2012-05-04 ENCOUNTER — Encounter: Payer: Self-pay | Admitting: *Deleted

## 2012-06-04 ENCOUNTER — Encounter: Payer: Self-pay | Admitting: Family Medicine

## 2012-06-04 ENCOUNTER — Ambulatory Visit (INDEPENDENT_AMBULATORY_CARE_PROVIDER_SITE_OTHER): Payer: BLUE CROSS/BLUE SHIELD | Admitting: Family Medicine

## 2012-06-04 VITALS — BP 130/72 | HR 102 | Temp 98.5°F | Ht 59.0 in | Wt 186.0 lb

## 2012-06-04 DIAGNOSIS — F329 Major depressive disorder, single episode, unspecified: Secondary | ICD-10-CM

## 2012-06-04 DIAGNOSIS — F32A Depression, unspecified: Secondary | ICD-10-CM

## 2012-06-04 DIAGNOSIS — I10 Essential (primary) hypertension: Secondary | ICD-10-CM

## 2012-06-04 NOTE — Progress Notes (Signed)
   Nature conservation officer at [facility] [address] [place] [address] Phone: [phone] Fax: [phone]  Date:  06/04/2012   Name:  Robin Bass   DOB:  22-Mar-1951   MRN:  [id number] Gender: female  Age: 61 y.o.  PCP:  Hannah Beat, MD    Chief Complaint: Follow-up   History of Present Illness:  Robin Bass is a 61 y.o. very pleasant female patient who presents with the following:  BP stable Cough improving, 95% better after d/c her ACE inhibitor. She did have to go to urgent care and get some cough syrup, but almost completely better  Mood is much better, stable No longer fatigued with change to prozac  Past Medical History, Surgical History, Social History, Family History, Problem List, Medications, and Allergies have been reviewed and updated if relevant.  Current Outpatient Prescriptions on File Prior to Visit  Medication Sig Dispense Refill  . amLODipine (NORVASC) 10 MG tablet Take 1 tablet (10 mg total) by mouth daily.  90 tablet  3  . estrogens, conjugated, (PREMARIN) 0.3 MG tablet Take 0.3 mg by mouth daily. Take daily for 21 days then do not take for 7 days.       [name] Kitchen FLUoxetine (PROZAC) 20 MG tablet Take 1 tablet (20 mg total) by mouth daily.  30 tablet  3  . levothyroxine (SYNTHROID, LEVOTHROID) 50 MCG tablet Take 50 mcg by mouth daily.        [name] Kitchen losartan-hydrochlorothiazide (HYZAAR) 100-25 MG per tablet Take 1 tablet by mouth daily.  30 tablet  5  . PREMARIN vaginal cream       . clonazePAM (KLONOPIN) 0.5 MG tablet Take 1 tablet (0.5 mg total) by mouth 2 (two) times daily.  60 tablet  0    Review of Systems:  GEN: No acute illnesses, no fevers, chills. GI: No n/v/d, eating normally Pulm: No SOB Interactive and getting along well at home.  Otherwise, ROS is as per the HPI.   Physical Examination: Filed Vitals:   06/04/12 1539  BP: 130/72  Pulse: 102  Temp: 98.5 F (36.9 C)   Filed Vitals:   06/04/12 1539  Height: 4\' 11"  (1.499 m)    Weight: 186 lb (84.369 kg)   Body mass index is 37.57 kg/(m^2). Ideal Body Weight: Weight in (lb) to have BMI = 25: 123.5    GEN: WDWN, NAD, Non-toxic, A & O x 3 HEENT: Atraumatic, Normocephalic. Neck supple. No masses, No LAD. Ears and Nose: No external deformity. EXTR: No c/c/e NEURO Normal gait.  PSYCH: Normally interactive. Conversant. Not depressed or anxious appearing.  Calm demeanor.    Assessment and Plan: 1. HYPERTENSION   2. Depression     Stable and improved, f/u 6 months  Hannah Beat, MD

## 2012-08-28 ENCOUNTER — Other Ambulatory Visit: Payer: Self-pay | Admitting: Family Medicine

## 2012-09-27 ENCOUNTER — Ambulatory Visit (INDEPENDENT_AMBULATORY_CARE_PROVIDER_SITE_OTHER): Payer: BLUE CROSS/BLUE SHIELD | Admitting: Family Medicine

## 2012-09-27 ENCOUNTER — Encounter: Payer: Self-pay | Admitting: Family Medicine

## 2012-09-27 VITALS — BP 120/74 | HR 80 | Temp 98.7°F | Ht 59.0 in | Wt 182.0 lb

## 2012-09-27 DIAGNOSIS — M549 Dorsalgia, unspecified: Secondary | ICD-10-CM

## 2012-09-27 DIAGNOSIS — Y32XXXA Crashing of motor vehicle, undetermined intent, initial encounter: Secondary | ICD-10-CM

## 2012-09-27 DIAGNOSIS — M25519 Pain in unspecified shoulder: Secondary | ICD-10-CM
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
## 2012-10-23 ENCOUNTER — Other Ambulatory Visit: Payer: Self-pay | Admitting: Family Medicine

## 2012-11-12 ENCOUNTER — Ambulatory Visit (INDEPENDENT_AMBULATORY_CARE_PROVIDER_SITE_OTHER): Payer: BC Managed Care – PPO | Admitting: Family Medicine

## 2012-11-12 ENCOUNTER — Encounter: Payer: Self-pay | Admitting: Family Medicine

## 2012-11-12 VITALS — BP 130/70 | HR 85 | Temp 98.4°F | Ht 59.0 in | Wt 180.0 lb

## 2012-11-12 DIAGNOSIS — F329 Major depressive disorder, single episode, unspecified: Secondary | ICD-10-CM

## 2012-11-12 DIAGNOSIS — E039 Hypothyroidism, unspecified: Secondary | ICD-10-CM

## 2012-11-12 DIAGNOSIS — I10 Essential (primary) hypertension: Secondary | ICD-10-CM

## 2012-11-12 DIAGNOSIS — F32A Depression, unspecified: Secondary | ICD-10-CM

## 2012-11-12 MED ORDER — LOSARTAN POTASSIUM-HCTZ 100-25 MG PO TABS: 1.0000 | ORAL_TABLET | Freq: Every day | ORAL | Status: DC

## 2012-11-12 MED ORDER — AMLODIPINE BESYLATE 10 MG PO TABS: 10.0000 mg | ORAL_TABLET | Freq: Every day | ORAL | Status: DC

## 2012-11-12 MED ORDER — LEVOTHYROXINE SODIUM 50 MCG PO TABS: 50.0000 ug | ORAL_TABLET | Freq: Every day | ORAL | Status: DC

## 2012-11-12 MED ORDER — FLUOXETINE HCL 20 MG PO TABS: 20.0000 mg | ORAL_TABLET | Freq: Every day | ORAL | Status: DC

## 2012-11-12 MED ORDER — ESTROGENS CONJUGATED 0.3 MG PO TABS: 0.3000 mg | ORAL_TABLET | Freq: Every day | ORAL | Status: DC

## 2012-11-12 NOTE — Patient Instructions (Addendum)
F/u 6 months
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
## 2013-04-16 ENCOUNTER — Ambulatory Visit (INDEPENDENT_AMBULATORY_CARE_PROVIDER_SITE_OTHER): Payer: BC Managed Care – PPO | Admitting: Family Medicine

## 2013-04-16 ENCOUNTER — Encounter: Payer: Self-pay | Admitting: Family Medicine

## 2013-04-16 VITALS — BP 130/72 | HR 88 | Temp 98.3°F | Ht 59.0 in | Wt 178.5 lb

## 2013-04-16 DIAGNOSIS — J209 Acute bronchitis, unspecified: Secondary | ICD-10-CM | POA: Insufficient documentation

## 2013-04-16 MED ORDER — DOXYCYCLINE HYCLATE 100 MG PO TABS: 100.0000 mg | ORAL_TABLET | Freq: Two times a day (BID) | ORAL | Status: DC

## 2013-04-16 MED ORDER — HYDROCODONE-HOMATROPINE 5-1.5 MG/5ML PO SYRP: 5.0000 mL | ORAL_SOLUTION | Freq: Every evening | ORAL | Status: DC | PRN

## 2013-04-16 MED ORDER — ALBUTEROL SULFATE HFA 108 (90 BASE) MCG/ACT IN AERS: 2.0000 | INHALATION_SPRAY | Freq: Four times a day (QID) | RESPIRATORY_TRACT | Status: DC | PRN

## 2013-04-16 NOTE — Assessment & Plan Note (Addendum)
Cannot sleep at night despite the cheratussin.   Will change to hydrocodone cough syrup, albuterol for coughing  Fits given asthma history.  No clear indication for steroids. If not improving can antibiotics.

## 2013-04-16 NOTE — Patient Instructions (Addendum)
Try tussionex at bedtime.  Use mucinex DM during the day. Or can use albuterol for coughing fits. If not improving in next several days.Lenox Ahr prescription for doxycycline.

## 2013-04-16 NOTE — Progress Notes (Signed)
  Subjective:    Patient ID: Robin Bass, female    DOB: 1951-01-21, 62 y.o.   MRN: [id number]  Cough This is a new problem. The current episode started in the past 7 days (seen on urgent care last week about 5 days ago). The problem has been gradually worsening. The cough is productive of sputum (blood streaks in mucus). Associated symptoms include nasal congestion and a sore throat. Pertinent negatives include no chest pain, chills, ear pain, fever, myalgias, postnasal drip, rash, rhinorrhea, shortness of breath, sweats, weight loss or wheezing. Risk factors: nonsmoker. Treatments tried: seen at urgent care and given Z-pack, cheratussiin and benzonatate. Her past medical history is significant for asthma and COPD.      Review of Systems  Constitutional: Negative for fever, chills and weight loss.  HENT: Positive for sore throat. Negative for ear pain, rhinorrhea and postnasal drip.   Respiratory: Positive for cough. Negative for shortness of breath and wheezing.   Cardiovascular: Negative for chest pain.  Musculoskeletal: Negative for myalgias.  Skin: Negative for rash.       Objective:   Physical Exam  Constitutional: Vital signs are normal. She appears well-developed and well-nourished. She is cooperative.  Non-toxic appearance. She does not appear ill. No distress.  HENT:  Head: Normocephalic.  Right Ear: Hearing, tympanic membrane, external ear and ear canal normal. Tympanic membrane is not erythematous, not retracted and not bulging.  Left Ear: Hearing, tympanic membrane, external ear and ear canal normal. Tympanic membrane is not erythematous, not retracted and not bulging.  Nose: Mucosal edema and rhinorrhea present. Right sinus exhibits no maxillary sinus tenderness and no frontal sinus tenderness. Left sinus exhibits no maxillary sinus tenderness and no frontal sinus tenderness.  Mouth/Throat: Uvula is midline, oropharynx is clear and moist and mucous membranes are normal.   Eyes: Conjunctivae, EOM and lids are normal. Pupils are equal, round, and reactive to light. No foreign bodies found.  Neck: Trachea normal and normal range of motion. Neck supple. Carotid bruit is not present. No mass and no thyromegaly present.  Cardiovascular: Normal rate, regular rhythm, S1 normal, S2 normal, normal heart sounds, intact distal pulses and normal pulses.  Exam reveals no gallop and no friction rub.   No murmur heard. Pulmonary/Chest: Effort normal. Not tachypneic. No respiratory distress. She has no decreased breath sounds. She has wheezes. She has no rhonchi. She has no rales.  Scattered rare wheeze  Neurological: She is alert.  Skin: Skin is warm, dry and intact. No rash noted.  Psychiatric: Her speech is normal and behavior is normal. Judgment normal. Her mood appears not anxious. Cognition and memory are normal. She does not exhibit a depressed mood.          Assessment & Plan:

## 2013-05-13 ENCOUNTER — Ambulatory Visit (INDEPENDENT_AMBULATORY_CARE_PROVIDER_SITE_OTHER): Payer: BC Managed Care – PPO | Admitting: Family Medicine

## 2013-05-13 ENCOUNTER — Encounter: Payer: Self-pay | Admitting: Family Medicine

## 2013-05-13 VITALS — BP 120/74 | HR 88 | Temp 98.6°F | Ht 59.0 in | Wt 179.8 lb

## 2013-05-13 DIAGNOSIS — E039 Hypothyroidism, unspecified: Secondary | ICD-10-CM

## 2013-05-13 DIAGNOSIS — F329 Major depressive disorder, single episode, unspecified: Secondary | ICD-10-CM

## 2013-05-13 DIAGNOSIS — F32A Depression, unspecified: Secondary | ICD-10-CM

## 2013-05-13 DIAGNOSIS — Z79899 Other long term (current) drug therapy: Secondary | ICD-10-CM

## 2013-05-13 DIAGNOSIS — Z13220 Encounter for screening for lipoid disorders: Secondary | ICD-10-CM

## 2013-05-13 DIAGNOSIS — J45909 Unspecified asthma, uncomplicated: Secondary | ICD-10-CM

## 2013-05-13 DIAGNOSIS — I10 Essential (primary) hypertension: Secondary | ICD-10-CM

## 2013-05-13 NOTE — Progress Notes (Signed)
Nature conservation officer at [facility] [address] [place] [address] Phone: [phone] Fax: [phone]  Date:  05/13/2013   Name:  Robin Bass   DOB:  August 10, 1951   MRN:  [id number] Gender: female Age: 62 y.o.  Primary Physician:  Hannah Beat, MD  Evaluating MD: Hannah Beat, MD   Chief Complaint: Follow-up   History of Present Illness:  Robin Bass is a 62 y.o. pleasant patient who presents with the following:  HTN: HTN: Tolerating all medications without side effects Stable and at goal No CP, no sob. No HA.  BP Readings from Last 3 Encounters:  05/13/13 120/74  04/16/13 130/72  11/12/12 130/70   Thyroid: No symptoms. Labs reviewed. Denies cold / heat intolerance, dry skin, hair loss. No goiter.  Lab Results  Component Value Date   TSH 3.88 05/02/2012    Dep, stable and doing well on prozac.   Asthma controlled with rare inhaler use  Overall, feeling well.   Patient Active Problem List   Diagnosis Date Noted  . Depression 03/31/2012  . Overactive bladder 02/05/2011  . 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 HYPOTHYROIDISM 07/31/2008  . HYPERTENSION 07/31/2008  . ALLERGIC RHINITIS 07/31/2008  . ASTHMA 07/31/2008  . COPD 07/31/2008  . GERD 07/31/2008    Past Medical History  Diagnosis Date  . Allergy   . Asthma   . COPD (chronic obstructive pulmonary disease)   . GERD (gastroesophageal reflux disease)   . Hypothyroidism   . Overactive bladder   . History of pituitary tumor   . Prolactin deficiency     Past Surgical History  Procedure Laterality Date  . Abdominal hysterectomy      History   Social History  . Marital Status: Married    Spouse Name: N/A    Number of Children: 1  . Years of Education: N/A   Occupational History  . Diplomatic Services operational officer    Social History Main Topics  . Smoking status: Never Smoker   . Smokeless tobacco: Never Used  . Alcohol Use: No  . Drug Use: No  . Sexually Active: Not on file   Other Topics  Concern  . Not on file   Social History Narrative  . No narrative on file    No family history on file.  Allergies  Allergen Reactions  . Sulfonamide Derivatives   . Ace Inhibitors Cough    Medication list has been reviewed and updated.  Outpatient Prescriptions Prior to Visit  Medication Sig Dispense Refill  . albuterol (PROAIR HFA) 108 (90 BASE) MCG/ACT inhaler Inhale 2 puffs into the lungs every 6 (six) hours as needed for wheezing.  3.7 g  0  . amLODipine (NORVASC) 10 MG tablet Take 1 tablet (10 mg total) by mouth daily.  90 tablet  3  . benzonatate (TESSALON) 200 MG capsule as needed.       [name] Kitchen estrogens, conjugated, (PREMARIN) 0.3 MG tablet Take 1 tablet (0.3 mg total) by mouth daily. Take daily for 21 days then do not take for 7 days.  63 tablet  3  . FLUoxetine (PROZAC) 20 MG tablet Take 1 tablet (20 mg total) by mouth daily.  90 tablet  3  . HYDROcodone-homatropine (HYCODAN) 5-1.5 MG/5ML syrup Take 5 mLs by mouth at bedtime as needed for cough.  120 mL  0  . levothyroxine (SYNTHROID, LEVOTHROID) 50 MCG tablet Take 1 tablet (50 mcg total) by mouth daily.  90 tablet  3  . losartan-hydrochlorothiazide (HYZAAR) 100-25  MG per tablet Take 1 tablet by mouth daily.  90 tablet  3  . PREMARIN vaginal cream every other day as needed.       . doxycycline (VIBRA-TABS) 100 MG tablet Take 1 tablet (100 mg total) by mouth 2 (two) times daily.  20 tablet  0   No facility-administered medications prior to visit.    Review of Systems:   GEN: No acute illnesses, no fevers, chills. GI: No n/v/d, eating normally Pulm: No SOB Interactive and getting along well at home.  Otherwise, ROS is as per the HPI.   Physical Examination: BP 120/74  Pulse 88  Temp(Src) 98.6 F (37 C) (Oral)  Ht 4\' 11"  (1.499 m)  Wt 179 lb 12 oz (81.534 kg)  BMI 36.29 kg/m2  Ideal Body Weight: Weight in (lb) to have BMI = 25: 123.5   GEN: WDWN, NAD, Non-toxic, A & O x 3 HEENT: Atraumatic, Normocephalic.  Neck supple. No masses, No LAD. Ears and Nose: No external deformity. CV: RRR, No M/G/R. No JVD. No thrill. No extra heart sounds. PULM: CTA B, no wheezes, crackles, rhonchi. No retractions. No resp. distress. No accessory muscle use. EXTR: No c/c/e NEURO Normal gait.  PSYCH: Normally interactive. Conversant. Not depressed or anxious appearing.  Calm demeanor.    Assessment and Plan:  Unspecified hypothyroidism - Plan: TSH  Encounter for long-term (current) use of other medications - Plan: Basic metabolic panel, CBC with Differential, Hepatic function panel  Screening for lipoid disorders - Plan: Lipid panel  HYPERTENSION  ASTHMA  Depression  >25 minutes spent in face to face time with patient, >50% spent in counselling or coordination of care: f/u stable probs, but we spent a long time in discussion regarding risks/benefits of estrogen. Ultimately, she decided to go off premarin and see if she can tolerate it, which I agree with.  Orders Today:  Orders Placed This Encounter  Procedures  . TSH  . Basic metabolic panel  . CBC with Differential  . Hepatic function panel  . Lipid panel    Updated Medication List: (Includes new medications, updates to list, dose adjustments) No orders of the defined types were placed in this encounter.    Medications Discontinued: Medications Discontinued During This Encounter  Medication Reason  . doxycycline (VIBRA-TABS) 100 MG tablet Completed Course  . estrogens, conjugated, (PREMARIN) 0.3 MG tablet       Signed, Synethia Endicott T. Sonakshi Rolland, MD 05/13/2013 3:26 PM

## 2013-05-14 ENCOUNTER — Encounter: Payer: Self-pay | Admitting: *Deleted

## 2013-05-14 LAB — CBC WITH DIFFERENTIAL/PLATELET
Eosinophils Relative: 1.5 % (ref 0.0–5.0)
Monocytes Relative: 5 % (ref 3.0–12.0)
Neutrophils Relative %: 64 % (ref 43.0–77.0)
Platelets: 351 10*3/uL (ref 150.0–400.0)
WBC: 9.4 10*3/uL (ref 4.5–10.5)

## 2013-05-14 LAB — BASIC METABOLIC PANEL
BUN: 13 mg/dL (ref 6–23)
Creatinine, Ser: 0.7 mg/dL (ref 0.4–1.2)
GFR: 90.61 mL/min (ref >60.00)
Potassium: 3.4 mEq/L — ABNORMAL LOW (ref 3.5–5.1)

## 2013-05-14 LAB — HEPATIC FUNCTION PANEL
ALT: 10 U/L (ref 0–35)
AST: 16 U/L (ref 0–37)
Alkaline Phosphatase: 96 U/L (ref 39–117)
Bilirubin, Direct: 0 mg/dL (ref 0.0–0.3)
Total Bilirubin: 0.1 mg/dL — ABNORMAL LOW (ref 0.3–1.2)

## 2013-05-14 LAB — LIPID PANEL
Cholesterol: 189 mg/dL (ref 0–200)
HDL: 55.2 mg/dL (ref >39.00)
Triglycerides: 228 mg/dL — ABNORMAL HIGH (ref 0.0–149.0)

## 2013-05-14 LAB — TSH: TSH: 1.58 u[IU]/mL (ref 0.35–5.50)

## 2013-05-14 LAB — LDL CHOLESTEROL, DIRECT: Direct LDL: 109.6 mg/dL

## 2013-09-03 ENCOUNTER — Other Ambulatory Visit: Payer: Self-pay | Admitting: Family Medicine

## 2013-10-18 ENCOUNTER — Encounter: Payer: Self-pay | Admitting: Family Medicine

## 2013-11-28 ENCOUNTER — Other Ambulatory Visit: Payer: Self-pay | Admitting: Family Medicine

## 2013-12-02 ENCOUNTER — Other Ambulatory Visit: Payer: Self-pay | Admitting: Family Medicine

## 2014-01-06 ENCOUNTER — Other Ambulatory Visit: Payer: Self-pay | Admitting: Family Medicine

## 2014-02-10 ENCOUNTER — Ambulatory Visit (INDEPENDENT_AMBULATORY_CARE_PROVIDER_SITE_OTHER): Payer: BC Managed Care – PPO | Admitting: Family Medicine

## 2014-02-10 ENCOUNTER — Encounter: Payer: Self-pay | Admitting: Family Medicine

## 2014-02-10 VITALS — BP 134/72 | HR 86 | Temp 98.2°F | Ht 59.75 in | Wt 180.8 lb

## 2014-02-10 DIAGNOSIS — J069 Acute upper respiratory infection, unspecified: Secondary | ICD-10-CM

## 2014-02-10 MED ORDER — BENZONATATE 200 MG PO CAPS: 200.0000 mg | ORAL_CAPSULE | Freq: Three times a day (TID) | ORAL | Status: DC | PRN

## 2014-02-10 NOTE — Progress Notes (Signed)
Patient Name: Robin Bass Date of Birth: 06/15/1951 Medical Record Number: [id number]  History of Present Illness:  This 63 y.o. female patient presents with runny nose, sneezing, cough, sore throat, malaise and minimal / low-grade fever .  Yearly cough and it is getting feeling bad since last Thursday.   Nose stopped up.  ? recent exposure to others with similar symptoms.   The patent denies sore throat as the primary complaint. Denies sthortness of breath/wheezing, high fever, chest pain, rhinits for more than 14 days, significant myalgia, otalgia, facial pain, abdominal pain, changes in bowel or bladder.  PMH, PHS, Allergies, Problem List, Medications, Family History, and Social History have all been reviewed.  Patient Active Problem List   Diagnosis Date Noted  . Depression 03/31/2012  . Overactive bladder 02/05/2011  . 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 HYPOTHYROIDISM 07/31/2008  . HYPERTENSION 07/31/2008  . ALLERGIC RHINITIS 07/31/2008  . ASTHMA 07/31/2008  . COPD 07/31/2008  . GERD 07/31/2008    Past Medical History  Diagnosis Date  . Allergy   . Asthma   . COPD (chronic obstructive pulmonary disease)   . GERD (gastroesophageal reflux disease)   . Hypothyroidism   . Overactive bladder   . History of pituitary tumor   . Prolactin deficiency     Past Surgical History  Procedure Laterality Date  . Abdominal hysterectomy      History   Social History  . Marital Status: Married    Spouse Name: N/A    Number of Children: 1  . Years of Education: N/A   Occupational History  . Network engineer    Social History Main Topics  . Smoking status: Never Smoker   . Smokeless tobacco: Never Used  . Alcohol Use: No  . Drug Use: No  . Sexual Activity: Not on file   Other Topics Concern  . Not on file   Social History Narrative  . No narrative on file    No family history on file.  Allergies  Allergen Reactions  . Sulfonamide Derivatives   . Ace  Inhibitors Cough    Medication list reviewed and updated in full in [place].  Review of Systems: as above, eating and drinking - tolerating PO. Urinating normally. No excessive vomitting or diarrhea. O/w as above.  Physical Exam:  Filed Vitals:   02/10/14 1544  BP: 134/72  Pulse: 86  Temp: 98.2 F (36.8 C)  TempSrc: Oral  Height: 4' 11.75" (1.518 m)  Weight: 180 lb 12 oz (81.988 kg)  SpO2: 94%    GEN: WDWN, Non-toxic, Atraumatic, normocephalic. A and O x 3. HEENT: Oropharynx clear without exudate, MMM, no significant LAD, mild rhinnorhea Ears: TM clear, COL visualized with good landmarks CV: RRR, no m/g/r. Pulm: CTA B, no wheezes, rhonchi, or crackles, normal respiratory effort. EXT: no c/c/e Psych: well oriented, neither depressed nor anxious in appearance  Objective Data: Results for orders placed in visit on 05/13/13  TSH      Result Value Ref Range   TSH 1.58  0.35 - 5.50 uIU/mL  BASIC METABOLIC PANEL      Result Value Ref Range   Sodium 138  135 - 145 mEq/L   Potassium 3.4 (*) 3.5 - 5.1 mEq/L   Chloride 98  96 - 112 mEq/L   CO2 29  19 - 32 mEq/L   Glucose, Bld 88  70 - 99 mg/dL   BUN 13  6 - 23 mg/dL   Creatinine, Ser 0.7  0.4 - 1.2 mg/dL   Calcium 9.5  8.4 - 10.5 mg/dL   GFR 90.61  >60.00 mL/min  CBC WITH DIFFERENTIAL      Result Value Ref Range   WBC 9.4  4.5 - 10.5 K/uL   RBC 5.12 (*) 3.87 - 5.11 Mil/uL   Hemoglobin 13.1  12.0 - 15.0 g/dL   HCT 39.2  36.0 - 46.0 %   MCV 76.5 (*) 78.0 - 100.0 fl   MCHC 33.4  30.0 - 36.0 g/dL   RDW 16.6 (*) 11.5 - 14.6 %   Platelets 351.0  150.0 - 400.0 K/uL   Neutrophils Relative % 64.0  43.0 - 77.0 %   Lymphocytes Relative 28.2  12.0 - 46.0 %   Monocytes Relative 5.0  3.0 - 12.0 %   Eosinophils Relative 1.5  0.0 - 5.0 %   Basophils Relative 1.3  0.0 - 3.0 %   Neutro Abs 6.0  1.4 - 7.7 K/uL   Lymphs Abs 2.7  0.7 - 4.0 K/uL   Monocytes Absolute 0.5  0.1 - 1.0 K/uL   Eosinophils Absolute 0.1  0.0 - 0.7  K/uL   Basophils Absolute 0.1  0.0 - 0.1 K/uL  HEPATIC FUNCTION PANEL      Result Value Ref Range   Total Bilirubin 0.1 (*) 0.3 - 1.2 mg/dL   Bilirubin, Direct 0.0  0.0 - 0.3 mg/dL   Alkaline Phosphatase 96  39 - 117 U/L   AST 16  0 - 37 U/L   ALT 10  0 - 35 U/L   Total Protein 8.0  6.0 - 8.3 g/dL   Albumin 3.9  3.5 - 5.2 g/dL  LIPID PANEL      Result Value Ref Range   Cholesterol 189  0 - 200 mg/dL   Triglycerides 228.0 (*) 0.0 - 149.0 mg/dL   HDL 55.20  >39.00 mg/dL   VLDL 45.6 (*) 0.0 - 40.0 mg/dL   Total CHOL/HDL Ratio 3    LDL CHOLESTEROL, DIRECT      Result Value Ref Range   Direct LDL 109.6      A/P: 1. URI. Supportive care reviewed with patient. See patient instruction section.  New Prescriptions   BENZONATATE (TESSALON) 200 MG CAPSULE    Take 1 capsule (200 mg total) by mouth 3 (three) times daily as needed for cough.    Patient Instructions: Upper Respiratory Infection and Viral Syndromes -Viral Infections  TREATMENT THAT HELPS WITH SYMPTOMS: 1. Drink plenty of fluids, but limit caffeine  2. Decongestant: for congested noses, sinuses, and ear tubes. Pressure release and help drainage: Sudafed or D component of combination medication. (pseudephedrine or Phenylephrine) (NOT IF YOU HAVE HIGH BLOOD PRESSURE) Avoid at night: sudafed makes it harder to sleep  3. Nasal Sprays: Relieve pressure, promote drainage, open nasal and ear passages. Afrin can be used for only 3-4 days in row.  4. Cough Suppressants: Delsym is an example of a cough suppressant. It lasts for 12 hours. Strongest over the counter.   6. Expectorants: Liquify secretions and improve drainage Mucinex is a 12 hour expectorant. (Extended release guaifenesin)  For cheaper expectorant or Mucinex alternative, get generic guaifenesin (Not extended release) from any drug store. Take 1 1/2 tablets in the morning and 1 1/2 tablets at noon.   THESE WILL MAKE YOU FEEL BETTER FOR A WHILE, SO YOU CAN DEAL WITH  YOUR SYMPTOMS. YOUR BODY HAS TO HEAL ITSELF.  ANTIBIOTICS DO NOT HELP IF YOU HAVE A VIRUS  OR BAD COLD.  Antibiotics kill bacteria not viruses.  Bad viruses can make you feel just as bad or worse than bacterial infections (Like the flu - it is a virus)  Ask a pharmacist for help if you cannot find these medications, and they will help you.   Signed,  [name]. Jawuan Robb, MD, [place] at [facility] [place] [address] Phone: [phone] Fax: [phone]   Patient's Medications  New Prescriptions   BENZONATATE (TESSALON) 200 MG CAPSULE    Take 1 capsule (200 mg total) by mouth 3 (three) times daily as needed for cough.  Previous Medications   ALBUTEROL (PROAIR HFA) 108 (90 BASE) MCG/ACT INHALER    Inhale 2 puffs into the lungs every 6 (six) hours as needed for wheezing.   AMLODIPINE (NORVASC) 10 MG TABLET    Take one tablet by mouth one time daily   FLUOXETINE (PROZAC) 20 MG TABLET    Take one tablet by mouth one time daily   LEVOTHYROXINE (SYNTHROID, LEVOTHROID) 50 MCG TABLET    Take one tablet by mouth one time daily   LOSARTAN-HYDROCHLOROTHIAZIDE (HYZAAR) 100-25 MG PER TABLET    Take one tablet by mouth one time daily   PREMARIN VAGINAL CREAM    every other day as needed.   Modified Medications   No medications on file  Discontinued Medications   BENZONATATE (TESSALON) 200 MG CAPSULE    as needed.    HYDROCODONE-HOMATROPINE (HYCODAN) 5-1.5 MG/5ML SYRUP    Take 5 mLs by mouth at bedtime as needed for cough.

## 2014-02-10 NOTE — Patient Instructions (Signed)
  Zyrtec-D once a day  Mucinex - DM, 2 tablets twice a day.

## 2014-02-10 NOTE — Progress Notes (Signed)
Pre visit review using our clinic review tool, if applicable. No additional management support is needed unless otherwise documented below in the visit note. 

## 2014-03-03 ENCOUNTER — Other Ambulatory Visit: Payer: Self-pay | Admitting: Family Medicine

## 2014-03-25 ENCOUNTER — Other Ambulatory Visit: Payer: Self-pay | Admitting: Family Medicine

## 2014-03-25 DIAGNOSIS — Z13220 Encounter for screening for lipoid disorders: Secondary | ICD-10-CM

## 2014-03-25 DIAGNOSIS — E039 Hypothyroidism, unspecified: Secondary | ICD-10-CM

## 2014-03-27 ENCOUNTER — Other Ambulatory Visit (INDEPENDENT_AMBULATORY_CARE_PROVIDER_SITE_OTHER): Payer: BC Managed Care – PPO

## 2014-03-27 DIAGNOSIS — E039 Hypothyroidism, unspecified: Secondary | ICD-10-CM

## 2014-03-27 DIAGNOSIS — Z13220 Encounter for screening for lipoid disorders: Secondary | ICD-10-CM

## 2014-03-27 LAB — LIPID PANEL
Cholesterol: 200 mg/dL (ref 0–200)
HDL: 45.1 mg/dL (ref >39.00)
LDL Cholesterol: 123 mg/dL — ABNORMAL HIGH (ref 0–99)
NonHDL: 154.9
Total CHOL/HDL Ratio: 4
Triglycerides: 159 mg/dL — ABNORMAL HIGH (ref 0.0–149.0)
VLDL: 31.8 mg/dL (ref 0.0–40.0)

## 2014-03-27 LAB — CBC WITH DIFFERENTIAL/PLATELET
BASOS PCT: 0.5 % (ref 0.0–3.0)
Basophils Absolute: 0 10*3/uL (ref 0.0–0.1)
EOS PCT: 2.4 % (ref 0.0–5.0)
Eosinophils Absolute: 0.2 10*3/uL (ref 0.0–0.7)
HEMATOCRIT: 39.6 % (ref 36.0–46.0)
HEMOGLOBIN: 13.1 g/dL (ref 12.0–15.0)
LYMPHS ABS: 2.5 10*3/uL (ref 0.7–4.0)
Lymphocytes Relative: 31.6 % (ref 12.0–46.0)
MCHC: 33.2 g/dL (ref 30.0–36.0)
MCV: 75.9 fl — AB (ref 78.0–100.0)
Monocytes Absolute: 0.4 10*3/uL (ref 0.1–1.0)
Monocytes Relative: 4.8 % (ref 3.0–12.0)
NEUTROS PCT: 60.7 % (ref 43.0–77.0)
Neutro Abs: 4.8 10*3/uL (ref 1.4–7.7)
Platelets: 349 10*3/uL (ref 150.0–400.0)
RBC: 5.22 Mil/uL — AB (ref 3.87–5.11)
RDW: 16.1 % — ABNORMAL HIGH (ref 11.5–15.5)
WBC: 7.9 10*3/uL (ref 4.0–10.5)

## 2014-03-27 LAB — BASIC METABOLIC PANEL
BUN: 14 mg/dL (ref 6–23)
CO2: 30 mEq/L (ref 19–32)
Calcium: 9.6 mg/dL (ref 8.4–10.5)
Chloride: 101 mEq/L (ref 96–112)
Creatinine, Ser: 0.7 mg/dL (ref 0.4–1.2)
GFR: 88.88 mL/min (ref >60.00)
Glucose, Bld: 128 mg/dL — ABNORMAL HIGH (ref 70–99)
POTASSIUM: 3.5 meq/L (ref 3.5–5.1)
Sodium: 139 mEq/L (ref 135–145)

## 2014-03-27 LAB — HEPATIC FUNCTION PANEL
ALT: 21 U/L (ref 0–35)
AST: 19 U/L (ref 0–37)
Albumin: 4 g/dL (ref 3.5–5.2)
Alkaline Phosphatase: 101 U/L (ref 39–117)
BILIRUBIN TOTAL: 0.5 mg/dL (ref 0.2–1.2)
Bilirubin, Direct: 0.1 mg/dL (ref 0.0–0.3)
Total Protein: 7.7 g/dL (ref 6.0–8.3)

## 2014-03-27 LAB — TSH: TSH: 3.29 u[IU]/mL (ref 0.35–4.50)

## 2014-04-01 ENCOUNTER — Encounter: Payer: Self-pay | Admitting: Family Medicine

## 2014-04-01 ENCOUNTER — Ambulatory Visit (INDEPENDENT_AMBULATORY_CARE_PROVIDER_SITE_OTHER): Payer: BC Managed Care – PPO | Admitting: Family Medicine

## 2014-04-01 VITALS — BP 120/70 | HR 81 | Temp 98.1°F | Ht 60.0 in | Wt 182.5 lb

## 2014-04-01 DIAGNOSIS — Z1211 Encounter for screening for malignant neoplasm of colon: Secondary | ICD-10-CM

## 2014-04-01 DIAGNOSIS — Z23 Encounter for immunization: Secondary | ICD-10-CM

## 2014-04-01 DIAGNOSIS — R739 Hyperglycemia, unspecified: Secondary | ICD-10-CM

## 2014-04-01 DIAGNOSIS — Z0000 Encounter for general adult medical examination without abnormal findings: Secondary | ICD-10-CM

## 2014-04-01 DIAGNOSIS — R7309 Other abnormal glucose: Secondary | ICD-10-CM

## 2014-04-01 MED ORDER — AMLODIPINE BESYLATE 10 MG PO TABS: ORAL_TABLET | ORAL | Status: DC

## 2014-04-01 MED ORDER — FLUOXETINE HCL 20 MG PO CAPS: ORAL_CAPSULE | ORAL | Status: DC

## 2014-04-01 MED ORDER — LEVOTHYROXINE SODIUM 50 MCG PO TABS: ORAL_TABLET | ORAL | Status: DC

## 2014-04-01 MED ORDER — LOSARTAN POTASSIUM-HCTZ 100-25 MG PO TABS
ORAL_TABLET | ORAL | Status: DC
Start: 2014-04-01 — End: 2015-05-16

## 2014-04-01 NOTE — Progress Notes (Signed)
Pre visit review using our clinic review tool, if applicable. No additional management support is needed unless otherwise documented below in the visit note. 

## 2014-04-01 NOTE — Progress Notes (Signed)
[place] [address] Phone: [phone] Fax: [phone]  Patient ID: Robin Bass MRN: [id number], DOB: 14-Apr-1951, 63 y.o. Date of Encounter: 04/01/2014  Primary Physician:  Owens Loffler, MD   Chief Complaint: Annual Exam  Subjective:   History of Present Illness:  Robin Bass is a 63 y.o. pleasant patient who presents with the following:  Health Maintenance Summary Reviewed and updated, unless pt declines services.  Tobacco History Reviewed. Non-smoker Alcohol: No concerns, no excessive use Exercise Habits: Some activity, rec at least 30 mins 5 times a week STD concerns: none Drug Use: None Birth control method: hyst Menses regular: yes Lumps or breast concerns: no Breast Cancer Family History: no  Blood sugar was high. This is at 128. Previously she has not had any kind of diabetes and she knows of.  Health Maintenance  Topic Date Due  . Tetanus/tdap  12/30/1971  . Colonoscopy  04/02/2015 (Originally 12/30/2002)  . Influenza Vaccine  05/24/2014  . Mammogram  10/11/2015  . Zostavax  Addressed     Immunization History  Administered Date(s) Administered  . Influenza Whole 07/31/2008, 07/12/2010  . Tdap 04/01/2014    Patient Active Problem List   Diagnosis Date Noted  . Depression 03/31/2012  . Overactive bladder 02/05/2011  . 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 HYPOTHYROIDISM 07/31/2008  . HYPERTENSION 07/31/2008  . ALLERGIC RHINITIS 07/31/2008  . ASTHMA 07/31/2008  . COPD 07/31/2008  . GERD 07/31/2008   Past Medical History  Diagnosis Date  . Allergy   . Asthma   . COPD (chronic obstructive pulmonary disease)   . GERD (gastroesophageal reflux disease)   . Hypothyroidism   . Overactive bladder   . History of pituitary tumor   . Prolactin deficiency    Past Surgical History  Procedure Laterality Date  . Abdominal hysterectomy     History   Social History  . Marital Status: Married    Spouse Name: N/A    Number of  Children: 1  . Years of Education: N/A   Occupational History  . Network engineer    Social History Main Topics  . Smoking status: Never Smoker   . Smokeless tobacco: Never Used  . Alcohol Use: No  . Drug Use: No  . Sexual Activity: Not on file   Other Topics Concern  . Not on file   Social History Narrative  . No narrative on file   No family history on file. Allergies  Allergen Reactions  . Sulfonamide Derivatives   . Ace Inhibitors Cough   Medication list has been reviewed and updated.  Review of Systems:   General: Denies fever, chills, sweats. No significant weight loss. Eyes: Denies blurring,significant itching ENT: Denies earache, sore throat, and hoarseness.  Cardiovascular: Denies chest pains, palpitations, dyspnea on exertion,  Respiratory: Denies cough, dyspnea at rest,wheeezing Breast: no concerns about lumps GI: Denies nausea, vomiting, diarrhea, constipation, change in bowel habits, abdominal pain, melena, hematochezia GU: Denies dysuria, hematuria, urinary hesitancy, nocturia, denies STD risk, no concerns about discharge Musculoskeletal: Denies back pain, joint pain Derm: Denies rash, itching Neuro: Denies  paresthesias, frequent falls, frequent headaches Psych: Denies depression, anxiety Endocrine: Denies cold intolerance, heat intolerance, polydipsia Heme: Denies enlarged lymph nodes Allergy: No hayfever  Objective:   Physical Examination: BP 120/70  Pulse 81  Temp(Src) 98.1 F (36.7 C) (Oral)  Ht 5' (1.524 m)  Wt 182 lb 8 oz (82.781 kg)  BMI 35.64 kg/m2  No exam data present GEN: well developed,  well nourished, no acute distress Eyes: conjunctiva and lids normal, PERRLA, EOMI ENT: TM clear, nares clear, oral exam WNL Neck: supple, no lymphadenopathy, no thyromegaly, no JVD Pulm: clear to auscultation and percussion, respiratory effort normal CV: regular rate and rhythm, S1-S2, no murmur, rub or gallop, no bruits Chest: no scars, masses, no  lumps BREAST: breast exam declined GI: soft, non-tender; no hepatosplenomegaly, masses; active bowel sounds all quadrants GU: GU exam declined Lymph: no cervical, axillary or inguinal adenopathy MSK: gait normal, muscle tone and strength WNL, no joint swelling, effusions, discoloration, crepitus  SKIN: clear, good turgor, color WNL, no rashes, lesions, or ulcerations Neuro: normal mental status, normal strength, sensation, and motion Psych: alert; oriented to person, place and time, normally interactive and not anxious or depressed in appearance.   All labs reviewed with patient. Lipids:    Component Value Date/Time   CHOL 200 03/27/2014 0905   TRIG 159.0* 03/27/2014 0905   HDL 45.10 03/27/2014 0905   LDLDIRECT 109.6 05/13/2013 1636   VLDL 31.8 03/27/2014 0905   CHOLHDL 4 03/27/2014 0905   CBC: CBC Latest Ref Rng 03/27/2014 05/13/2013 05/02/2012  WBC 4.0 - 10.5 K/uL 7.9 9.4 11.6(H)  Hemoglobin 12.0 - 15.0 g/dL 13.1 13.1 13.0  Hematocrit 36.0 - 46.0 % 39.6 39.2 38.9  Platelets 150.0 - 400.0 K/uL 349.0 351.0 161.0    Basic Metabolic Panel:    Component Value Date/Time   NA 139 03/27/2014 0905   K 3.5 03/27/2014 0905   CL 101 03/27/2014 0905   CO2 30 03/27/2014 0905   BUN 14 03/27/2014 0905   CREATININE 0.7 03/27/2014 0905   GLUCOSE 128* 03/27/2014 0905   CALCIUM 9.6 03/27/2014 0905   Hepatic Function Latest Ref Rng 03/27/2014 05/13/2013 05/02/2012  Total Protein 6.0 - 8.3 g/dL 7.7 8.0 8.2  Albumin 3.5 - 5.2 g/dL 4.0 3.9 3.9  AST 0 - 37 U/L 19 16 22   ALT 0 - 35 U/L 21 10 14   Alk Phosphatase 39 - 117 U/L 101 96 96  Total Bilirubin 0.2 - 1.2 mg/dL 0.5 0.1(L) 0.1(L)  Bilirubin, Direct 0.0 - 0.3 mg/dL 0.1 0.0 0.1    Lab Results  Component Value Date   TSH 3.29 03/27/2014    No results found.  Assessment & Plan:   Routine general medical examination at a health care facility  Hyperglycemia - Plan: Basic metabolic panel, Hemoglobin A1c  Special screening for malignant neoplasms, colon - Plan:  Fecal occult blood, imunochemical, CANCELED: Fecal occult blood, imunochemical  Need for prophylactic vaccination with combined diphtheria-tetanus-pertussis (DTP) vaccine - Plan: Tdap vaccine greater than or equal to 7yo IM  Health Maintenance Exam: The patient's preventative maintenance and recommended screening tests for an annual wellness exam were reviewed in full today. Brought up to date unless services declined.  Counselled on the importance of diet, exercise, and its role in overall health and mortality. The patient's FH and SH was reviewed, including their home life, tobacco status, and drug and alcohol status.  Recheck labs 6 weeks, bs and a1c  Follow-up: Return in about 6 weeks (around 05/13/2014) for LABWORK. Or follow-up in 1 year for complete physical examination  New Prescriptions   No medications on file   Modified Medications   Modified Medication Previous Medication   AMLODIPINE (NORVASC) 10 MG TABLET amLODipine (NORVASC) 10 MG tablet      Take one tablet by mouth one time daily    Take one tablet by mouth one time daily  FLUOXETINE (PROZAC) 20 MG CAPSULE FLUoxetine (PROZAC) 20 MG capsule      TAKE ONE CAPSULE BY MOUTH ONE TIME DAILY    TAKE ONE CAPSULE BY MOUTH ONE TIME DAILY    LEVOTHYROXINE (SYNTHROID, LEVOTHROID) 50 MCG TABLET levothyroxine (SYNTHROID, LEVOTHROID) 50 MCG tablet      Take one tablet by mouth one time daily    Take one tablet by mouth one time daily   LOSARTAN-HYDROCHLOROTHIAZIDE (HYZAAR) 100-25 MG PER TABLET losartan-hydrochlorothiazide (HYZAAR) 100-25 MG per tablet      Take one tablet by mouth one time daily    Take one tablet by mouth one time daily   Orders Placed This Encounter  Procedures  . Fecal occult blood, imunochemical  . Tdap vaccine greater than or equal to 7yo IM  . Basic metabolic panel  . Hemoglobin A1c    Signed,  Loukas Antonson T. Doretha Goding, MD, [facility]   Discontinued Medications   No medications on file    Current Medications at Discharge:   Medication List       This list is accurate as of: 04/01/14  5:39 PM.  Always use your most recent med list.               albuterol 108 (90 BASE) MCG/ACT inhaler  Commonly known as:  PROAIR HFA  Inhale 2 puffs into the lungs every 6 (six) hours as needed for wheezing.     amLODipine 10 MG tablet  Commonly known as:  NORVASC  Take one tablet by mouth one time daily     benzonatate 200 MG capsule  Commonly known as:  TESSALON  Take 1 capsule (200 mg total) by mouth 3 (three) times daily as needed for cough.     FLUoxetine 20 MG capsule  Commonly known as:  PROZAC  TAKE ONE CAPSULE BY MOUTH ONE TIME DAILY     levothyroxine 50 MCG tablet  Commonly known as:  SYNTHROID, LEVOTHROID  Take one tablet by mouth one time daily     losartan-hydrochlorothiazide 100-25 MG per tablet  Commonly known as:  HYZAAR  Take one tablet by mouth one time daily     PREMARIN vaginal cream  Generic drug:  conjugated estrogens  every other day as needed.

## 2014-05-13 ENCOUNTER — Other Ambulatory Visit (INDEPENDENT_AMBULATORY_CARE_PROVIDER_SITE_OTHER): Payer: BC Managed Care – PPO

## 2014-05-13 DIAGNOSIS — R7309 Other abnormal glucose: Secondary | ICD-10-CM

## 2014-05-13 DIAGNOSIS — R739 Hyperglycemia, unspecified: Secondary | ICD-10-CM

## 2014-05-13 DIAGNOSIS — I10 Essential (primary) hypertension: Secondary | ICD-10-CM

## 2014-05-13 LAB — HEMOGLOBIN A1C: Hgb A1c MFr Bld: 7.4 % — ABNORMAL HIGH (ref 4.6–6.5)

## 2014-05-13 LAB — BASIC METABOLIC PANEL
BUN: 14 mg/dL (ref 6–23)
CO2: 30 mEq/L (ref 19–32)
CREATININE: 0.6 mg/dL (ref 0.4–1.2)
Calcium: 9.5 mg/dL (ref 8.4–10.5)
Chloride: 101 mEq/L (ref 96–112)
GFR: 100.15 mL/min (ref >60.00)
GLUCOSE: 126 mg/dL — AB (ref 70–99)
Potassium: 3.6 mEq/L (ref 3.5–5.1)
Sodium: 139 mEq/L (ref 135–145)

## 2014-05-19 ENCOUNTER — Encounter: Payer: Self-pay | Admitting: *Deleted

## 2014-05-19 MED ORDER — METFORMIN HCL 500 MG PO TABS: 500.0000 mg | ORAL_TABLET | Freq: Every day | ORAL | Status: DC

## 2014-05-19 NOTE — Addendum Note (Signed)
Addended by: Carter Kitten on: 05/19/2014 04:44 PM   Modules accepted: Orders

## 2014-05-19 NOTE — Telephone Encounter (Signed)
This encounter was created in error - please disregard.

## 2014-05-21 ENCOUNTER — Other Ambulatory Visit: Payer: Self-pay | Admitting: Family Medicine

## 2014-05-21 DIAGNOSIS — E1169 Type 2 diabetes mellitus with other specified complication: Secondary | ICD-10-CM

## 2014-05-21 DIAGNOSIS — E669 Obesity, unspecified: Principal | ICD-10-CM

## 2014-06-03 ENCOUNTER — Ambulatory Visit: Payer: Self-pay | Admitting: Family Medicine

## 2014-06-03 ENCOUNTER — Telehealth: Payer: Self-pay

## 2014-06-03 NOTE — Telephone Encounter (Signed)
Pt went to [facility] and was given a glucose meter; pt will ck with her ins co to see if glucose test strips and lancets are covered by pts ins. Pt will cb and advise what type test strips and lancets pt will need to be ordered. Pt also said the lady at the [facility] lifestyle advised her to check her sugar twice a day but on the paper work pt was given from lifestyle instructs pt to ck BS once daily. When pt calls back with type of testing supplies ins will approve pt request Dr Coplands input on how often pt should ck BS.[employer].

## 2014-06-03 NOTE — Telephone Encounter (Signed)
At the beginning twice a day helpful. 1 fasting and 1 two hours after a meal.  Write down, bring to f/u ov  Disp #100, 11 ref of both lancets and strips

## 2014-06-03 NOTE — Telephone Encounter (Addendum)
Pt called back and request one touch ultra blue test strips and one touch delica  [employer]. Pt also wants to know how often [name] wants pt to ck BS. Pt request cb when sent to pharmacy. As addended note will not let me put request under meds and orders.

## 2014-06-03 NOTE — Addendum Note (Signed)
Addended by: Helene Shoe on: 06/03/2014 04:51 PM   Modules accepted: Orders

## 2014-06-04 ENCOUNTER — Telehealth: Payer: Self-pay | Admitting: Family Medicine

## 2014-06-04 MED ORDER — GLUCOSE BLOOD VI STRP: ORAL_STRIP | Status: DC

## 2014-06-04 MED ORDER — ONETOUCH DELICA LANCETS 33G MISC: Status: AC

## 2014-06-04 NOTE — Telephone Encounter (Signed)
Returned Mrs. Creelman call.  See previous phone note from 06/03/2014.

## 2014-06-04 NOTE — Telephone Encounter (Signed)
Left message for Elaine to return my call. 

## 2014-06-04 NOTE — Addendum Note (Signed)
Addended by: Carter Kitten on: 06/04/2014 10:43 AM   Modules accepted: Orders

## 2014-06-04 NOTE — Telephone Encounter (Signed)
Ms. Vinluan notified as instructed by telephone.  She will bring is glucose reading when she follows up in October with Dr. Lorelei Pont.

## 2014-06-04 NOTE — Telephone Encounter (Signed)
Patient returned your call.

## 2014-06-10 ENCOUNTER — Telehealth: Payer: Self-pay

## 2014-06-10 NOTE — Telephone Encounter (Signed)
yes

## 2014-06-10 NOTE — Telephone Encounter (Signed)
Pt said recently diagnosed with diabetes; pt usually gets flu shot at [facility];last flu shot was at [facility] in 2014. pt wants to verify she should get flu shot; advised pt she should get flu shot with her diagnosis of COPD and diabetes. Pt said since newly diagnosed with diabetes would like to ask [name] as well. Please advise.

## 2014-06-11 NOTE — Telephone Encounter (Signed)
Spoke to pt and advised per [name]; pt verbally expressed understanding

## 2014-06-24 ENCOUNTER — Ambulatory Visit: Payer: Self-pay | Admitting: Family Medicine

## 2014-07-24 ENCOUNTER — Ambulatory Visit: Payer: Self-pay | Admitting: Family Medicine

## 2014-08-07 LAB — HM DIABETES EYE EXAM

## 2014-08-20 ENCOUNTER — Encounter: Payer: Self-pay | Admitting: Family Medicine

## 2014-08-21 ENCOUNTER — Encounter: Payer: Self-pay | Admitting: *Deleted

## 2014-08-21 ENCOUNTER — Ambulatory Visit (INDEPENDENT_AMBULATORY_CARE_PROVIDER_SITE_OTHER): Payer: BC Managed Care – PPO | Admitting: Family Medicine

## 2014-08-21 ENCOUNTER — Encounter: Payer: Self-pay | Admitting: Family Medicine

## 2014-08-21 VITALS — BP 149/69 | HR 78 | Temp 98.2°F | Ht 60.0 in | Wt 179.5 lb

## 2014-08-21 DIAGNOSIS — E119 Type 2 diabetes mellitus without complications: Secondary | ICD-10-CM | POA: Insufficient documentation

## 2014-08-21 DIAGNOSIS — Z23 Encounter for immunization: Secondary | ICD-10-CM

## 2014-08-21 HISTORY — DX: Type 2 diabetes mellitus without complications: E11.9

## 2014-08-21 LAB — HEMOGLOBIN A1C: Hgb A1c MFr Bld: 6.7 % — ABNORMAL HIGH (ref 4.6–6.5)

## 2014-08-21 NOTE — Progress Notes (Signed)
Dr. Frederico Hamman T. Jerric Oyen, MD, [facility] [place], [address] Phone: [phone] Fax: [phone]  08/21/2014  Patient: Robin Bass, MRN: [id number], DOB: 12-04-1950, 63 y.o.  Primary Physician:  Owens Loffler, MD  Chief Complaint: Diabetes  Subjective:   Robin Bass is a 63 y.o. very pleasant female patient who presents with the following:  Wt Readings from Last 3 Encounters:  08/21/14 179 lb 8 oz (81.421 kg)  04/01/14 182 lb 8 oz (82.781 kg)  02/10/14 180 lb 12 oz (81.988 kg)    Immunization History  Administered Date(s) Administered  . Influenza Whole 07/31/2008, 07/12/2010  . Pneumococcal Conjugate-13 08/21/2014  . Tdap 04/01/2014    The patient is here for follow-up on new diagnosis of diabetes mellitus.  She has been doing well.  She has gone to diabetic teaching classes.  She has understood everything that they have talked quite well.  She has altered her diet, and has lost some weight.  Her blood sugar has gotten more and more under control.  Diabetes Mellitus: Tolerating Medications: yes Compliance with diet: good Exercise: minimal / intermittent Avg blood sugars at home: BID right now Foot problems: none Hypoglycemia: none No nausea, vomitting, blurred vision, polyuria.  Lab Results  Component Value Date   HGBA1C 6.7* 08/21/2014   HGBA1C 7.4* 05/13/2014   Lab Results  Component Value Date   LDLCALC 123* 03/27/2014   CREATININE 0.6 05/13/2014    Wt Readings from Last 3 Encounters:  08/21/14 179 lb 8 oz (81.421 kg)  04/01/14 182 lb 8 oz (82.781 kg)  02/10/14 180 lb 12 oz (81.988 kg)    Body mass index is 35.06 kg/(m^2).   Past Medical History, Surgical History, Social History, Family History, Problem List, Medications, and Allergies have been reviewed and updated if relevant.   GEN: No acute illnesses, no fevers, chills. GI: No n/v/d, eating normally Pulm: No SOB Interactive and  getting along well at home.  Otherwise, ROS is as per the HPI.  Objective:   BP 149/69  Pulse 78  Temp(Src) 98.2 F (36.8 C) (Oral)  Ht 5' (1.524 m)  Wt 179 lb 8 oz (81.421 kg)  BMI 35.06 kg/m2  GEN: WDWN, NAD, Non-toxic, A & O x 3 HEENT: Atraumatic, Normocephalic. Neck supple. No masses, No LAD. Ears and Nose: No external deformity. CV: RRR, No M/G/R. No JVD. No thrill. No extra heart sounds. PULM: CTA B, no wheezes, crackles, rhonchi. No retractions. No resp. distress. No accessory muscle use. EXTR: No c/c/e NEURO Normal gait.  PSYCH: Normally interactive. Conversant. Not depressed or anxious appearing.  Calm demeanor.   Laboratory and Imaging Data: Results for orders placed in visit on 08/21/14  HEMOGLOBIN A1C      Result Value Ref Range   Hemoglobin A1C 6.7 (*) 4.6 - 6.5 %     Assessment and Plan:   Diabetes mellitus type 2, controlled, without complications - Plan: Hemoglobin A1c  Need for prophylactic vaccination against Streptococcus pneumoniae (pneumococcus) - Plan: Pneumococcal conjugate vaccine 13-valent  >25 minutes spent in face to face time with patient, >50% spent in counselling or coordination of care: new-onset diagnosis of diabetes mellitus, type II.  Now stable with A1c of 6.7.  Adjusting well to her diagnosis.  Compliant with medications, and improving with her diet.  Answered all questions.  Follow-up: Return in about 6 months (around 02/20/2015).  New Prescriptions   No medications on file  Orders Placed This Encounter  Procedures  . Pneumococcal conjugate vaccine 13-valent  . Hemoglobin A1c    Signed,  Yifan Auker T. Merrily Tegeler, MD   Patient's Medications  New Prescriptions   No medications on file  Previous Medications   ALBUTEROL (PROAIR HFA) 108 (90 BASE) MCG/ACT INHALER    Inhale 2 puffs into the lungs every 6 (six) hours as needed for wheezing.   AMLODIPINE (NORVASC) 10 MG TABLET    Take one tablet by mouth one time daily   BENZONATATE  (TESSALON) 200 MG CAPSULE    Take 1 capsule (200 mg total) by mouth 3 (three) times daily as needed for cough.   FLUOXETINE (PROZAC) 20 MG CAPSULE    TAKE ONE CAPSULE BY MOUTH ONE TIME DAILY   GLUCOSE BLOOD (ONE TOUCH ULTRA TEST) TEST STRIP    To check blood sugar twice daily and as directed.   LEVOTHYROXINE (SYNTHROID, LEVOTHROID) 50 MCG TABLET    Take one tablet by mouth one time daily   LOSARTAN-HYDROCHLOROTHIAZIDE (HYZAAR) 100-25 MG PER TABLET    Take one tablet by mouth one time daily   METFORMIN (GLUCOPHAGE) 500 MG TABLET    Take 1 tablet (500 mg total) by mouth daily with breakfast.   ONETOUCH DELICA LANCETS 34J MISC    Use to check blood sugar two times a day   PREMARIN VAGINAL CREAM    every other day as needed.   Modified Medications   No medications on file  Discontinued Medications   No medications on file

## 2014-08-21 NOTE — Progress Notes (Signed)
Pre visit review using our clinic review tool, if applicable. No additional management support is needed unless otherwise documented below in the visit note. 

## 2014-10-14 ENCOUNTER — Encounter: Payer: Self-pay | Admitting: Family Medicine

## 2014-11-03 ENCOUNTER — Other Ambulatory Visit: Payer: Self-pay | Admitting: Family Medicine

## 2015-02-23 ENCOUNTER — Encounter: Payer: Self-pay | Admitting: Family Medicine

## 2015-02-23 ENCOUNTER — Ambulatory Visit (INDEPENDENT_AMBULATORY_CARE_PROVIDER_SITE_OTHER): Payer: BLUE CROSS/BLUE SHIELD | Admitting: Family Medicine

## 2015-02-23 VITALS — BP 128/60 | HR 90 | Temp 98.1°F | Ht 60.0 in | Wt 172.0 lb

## 2015-02-23 DIAGNOSIS — E119 Type 2 diabetes mellitus without complications: Secondary | ICD-10-CM

## 2015-02-23 DIAGNOSIS — E038 Other specified hypothyroidism: Secondary | ICD-10-CM

## 2015-02-23 MED ORDER — CLONAZEPAM 0.5 MG PO TABS: 0.5000 mg | ORAL_TABLET | Freq: Two times a day (BID) | ORAL | Status: DC | PRN

## 2015-02-23 NOTE — Progress Notes (Signed)
Dr. Frederico Hamman T. Robbie Rideaux, MD, [facility] [place], [address] Phone: [phone] Fax: [phone]  02/23/2015  Patient: Robin Bass, MRN: [id number], DOB: [birth year], 64 y.o.  Primary Physician:  Owens Loffler, MD  Chief Complaint: Diabetes  Subjective:   Robin Bass is a 64 y.o. very pleasant female patient who presents with the following:  DM: sugar and stress have been up Daughter is living next door  Diabetes Mellitus: Tolerating Medications: yes Compliance with diet: fair Exercise: minimal / intermittent Avg blood sugars at home: 80-110 fasting Foot problems: none Hypoglycemia: none No nausea, vomitting, blurred vision, polyuria.  Lab Results  Component Value Date   HGBA1C 6.5 02/23/2015   HGBA1C 6.7* 08/21/2014   HGBA1C 7.4* 05/13/2014   Lab Results  Component Value Date   LDLCALC 123* 03/27/2014   CREATININE 0.6 05/13/2014    Wt Readings from Last 3 Encounters:  02/23/15 172 lb (78.019 kg)  08/21/14 179 lb 8 oz (81.421 kg)  04/01/14 182 lb 8 oz (82.781 kg)    Body mass index is 33.59 kg/(m^2).   Thyroid: No symptoms. Labs reviewed. Denies cold / heat intolerance, dry skin, hair loss. No goiter.  Lab Results  Component Value Date   TSH 2.82 02/23/2015     Past Medical History, Surgical History, Social History, Family History, Problem List, Medications, and Allergies have been reviewed and updated if relevant.   GEN: No acute illnesses, no fevers, chills. GI: No n/v/d, eating normally Pulm: No SOB Interactive and getting along well at home.  Otherwise, ROS is as per the HPI.  Objective:   BP 128/60 mmHg  Pulse 90  Temp(Src) 98.1 F (36.7 C) (Oral)  Ht 5' (1.524 m)  Wt 172 lb (78.019 kg)  BMI 33.59 kg/m2  GEN: WDWN, NAD, Non-toxic, A & O x 3 HEENT: Atraumatic, Normocephalic. Neck supple. No masses, No LAD. Ears and Nose: No external deformity. CV: RRR, No M/G/R. No JVD. No  thrill. No extra heart sounds. PULM: CTA B, no wheezes, crackles, rhonchi. No retractions. No resp. distress. No accessory muscle use. EXTR: No c/c/e NEURO Normal gait.  PSYCH: Normally interactive. Conversant. Not depressed or anxious appearing.  Calm demeanor.   Laboratory and Imaging Data: Results for orders placed or performed in visit on 02/23/15  TSH  Result Value Ref Range   TSH 2.82 0.35 - 4.50 uIU/mL  Hemoglobin A1c  Result Value Ref Range   Hgb A1c MFr Bld 6.5 4.6 - 6.5 %     Assessment and Plan:   Diabetes mellitus type 2, controlled, without complications - Plan: Hemoglobin A1c  Other specified hypothyroidism - Plan: TSH  No changes for DM and thyroid  For occ anxiety, she has taken some klonopin. She has had the same bottle for 3 years.  Follow-up: Return in about 6 months (around 08/26/2015) for Complete Physical, 30 min.  New Prescriptions   CLONAZEPAM (KLONOPIN) 0.5 MG TABLET    Take 1 tablet (0.5 mg total) by mouth 2 (two) times daily as needed for anxiety.   Orders Placed This Encounter  Procedures  . TSH  . Hemoglobin A1c    Signed,  Jakeia Carreras T. Corneilus Heggie, MD   Patient's Medications  New Prescriptions   CLONAZEPAM (KLONOPIN) 0.5 MG TABLET    Take 1 tablet (0.5 mg total) by mouth 2 (two) times daily as needed for anxiety.  Previous Medications   ALBUTEROL (PROAIR HFA) 108 (90 BASE)  MCG/ACT INHALER    Inhale 2 puffs into the lungs every 6 (six) hours as needed for wheezing.   AMLODIPINE (NORVASC) 10 MG TABLET    Take one tablet by mouth one time daily   BENZONATATE (TESSALON) 200 MG CAPSULE    Take 1 capsule (200 mg total) by mouth 3 (three) times daily as needed for cough.   FLUOXETINE (PROZAC) 20 MG CAPSULE    TAKE ONE CAPSULE BY MOUTH ONE TIME DAILY   GLUCOSE BLOOD (ONE TOUCH ULTRA TEST) TEST STRIP    To check blood sugar twice daily and as directed.   LEVOTHYROXINE (SYNTHROID, LEVOTHROID) 50 MCG TABLET    Take one tablet by mouth one time daily    LOSARTAN-HYDROCHLOROTHIAZIDE (HYZAAR) 100-25 MG PER TABLET    Take one tablet by mouth one time daily   METFORMIN (GLUCOPHAGE) 500 MG TABLET    TAKE ONE TABLET BY MOUTH ONE TIME DAILY with breakfast    ONETOUCH DELICA LANCETS 45Y MISC    Use to check blood sugar two times a day   PREMARIN VAGINAL CREAM    every other day as needed.   Modified Medications   No medications on file  Discontinued Medications   No medications on file

## 2015-02-23 NOTE — Progress Notes (Signed)
Pre visit review using our clinic review tool, if applicable. No additional management support is needed unless otherwise documented below in the visit note. 

## 2015-02-24 ENCOUNTER — Telehealth: Payer: Self-pay | Admitting: Family Medicine

## 2015-02-24 LAB — HEMOGLOBIN A1C: Hgb A1c MFr Bld: 6.5 % (ref 4.6–6.5)

## 2015-02-24 LAB — TSH: TSH: 2.82 u[IU]/mL (ref 0.35–4.50)

## 2015-02-25 ENCOUNTER — Encounter: Payer: Self-pay | Admitting: *Deleted

## 2015-03-29 ENCOUNTER — Other Ambulatory Visit: Payer: Self-pay | Admitting: Family Medicine

## 2015-04-03 ENCOUNTER — Other Ambulatory Visit: Payer: Self-pay | Admitting: Family Medicine

## 2015-05-05 ENCOUNTER — Other Ambulatory Visit: Payer: Self-pay | Admitting: Family Medicine

## 2015-05-16 ENCOUNTER — Other Ambulatory Visit: Payer: Self-pay | Admitting: Family Medicine

## 2015-06-20 ENCOUNTER — Other Ambulatory Visit: Payer: Self-pay | Admitting: Family Medicine

## 2015-06-25 ENCOUNTER — Other Ambulatory Visit: Payer: Self-pay | Admitting: Family Medicine

## 2015-08-31 ENCOUNTER — Other Ambulatory Visit: Payer: Self-pay | Admitting: Family Medicine

## 2015-08-31 ENCOUNTER — Other Ambulatory Visit (INDEPENDENT_AMBULATORY_CARE_PROVIDER_SITE_OTHER): Payer: BLUE CROSS/BLUE SHIELD

## 2015-08-31 DIAGNOSIS — E039 Hypothyroidism, unspecified: Secondary | ICD-10-CM | POA: Diagnosis not present

## 2015-08-31 DIAGNOSIS — E785 Hyperlipidemia, unspecified: Secondary | ICD-10-CM

## 2015-08-31 DIAGNOSIS — Z79899 Other long term (current) drug therapy: Secondary | ICD-10-CM | POA: Diagnosis not present

## 2015-08-31 DIAGNOSIS — E119 Type 2 diabetes mellitus without complications: Secondary | ICD-10-CM | POA: Diagnosis not present

## 2015-08-31 LAB — T4, FREE: Free T4: 1.04 ng/dL (ref 0.60–1.60)

## 2015-08-31 LAB — BASIC METABOLIC PANEL
BUN: 15 mg/dL (ref 6–23)
CHLORIDE: 100 meq/L (ref 96–112)
CO2: 30 meq/L (ref 19–32)
CREATININE: 0.69 mg/dL (ref 0.40–1.20)
Calcium: 10 mg/dL (ref 8.4–10.5)
GFR: 91.43 mL/min (ref >60.00)
Glucose, Bld: 114 mg/dL — ABNORMAL HIGH (ref 70–99)
POTASSIUM: 3.6 meq/L (ref 3.5–5.1)
Sodium: 140 mEq/L (ref 135–145)

## 2015-08-31 LAB — CBC WITH DIFFERENTIAL/PLATELET
BASOS PCT: 0.7 % (ref 0.0–3.0)
Basophils Absolute: 0.1 10*3/uL (ref 0.0–0.1)
EOS PCT: 1.8 % (ref 0.0–5.0)
Eosinophils Absolute: 0.2 10*3/uL (ref 0.0–0.7)
HEMATOCRIT: 39.5 % (ref 36.0–46.0)
HEMOGLOBIN: 13.1 g/dL (ref 12.0–15.0)
Lymphocytes Relative: 27.4 % (ref 12.0–46.0)
Lymphs Abs: 2.3 10*3/uL (ref 0.7–4.0)
MCHC: 33.1 g/dL (ref 30.0–36.0)
MCV: 75.5 fl — ABNORMAL LOW (ref 78.0–100.0)
MONO ABS: 0.4 10*3/uL (ref 0.1–1.0)
Monocytes Relative: 5.1 % (ref 3.0–12.0)
NEUTROS ABS: 5.5 10*3/uL (ref 1.4–7.7)
NEUTROS PCT: 65 % (ref 43.0–77.0)
Platelets: 344 10*3/uL (ref 150.0–400.0)
RBC: 5.23 Mil/uL — ABNORMAL HIGH (ref 3.87–5.11)
RDW: 15.9 % — AB (ref 11.5–15.5)
WBC: 8.4 10*3/uL (ref 4.0–10.5)

## 2015-08-31 LAB — HEPATIC FUNCTION PANEL
ALK PHOS: 104 U/L (ref 39–117)
ALT: 13 U/L (ref 0–35)
AST: 14 U/L (ref 0–37)
Albumin: 4 g/dL (ref 3.5–5.2)
BILIRUBIN DIRECT: 0.1 mg/dL (ref 0.0–0.3)
BILIRUBIN TOTAL: 0.5 mg/dL (ref 0.2–1.2)
TOTAL PROTEIN: 7.4 g/dL (ref 6.0–8.3)

## 2015-08-31 LAB — T3, FREE: T3, Free: 3 pg/mL (ref 2.3–4.2)

## 2015-08-31 LAB — MICROALBUMIN / CREATININE URINE RATIO
CREATININE, U: 206.5 mg/dL
MICROALB UR: 1.1 mg/dL (ref 0.0–1.9)
MICROALB/CREAT RATIO: 0.5 mg/g (ref 0.0–30.0)

## 2015-08-31 LAB — LIPID PANEL
CHOLESTEROL: 210 mg/dL — AB (ref 0–200)
HDL: 50.5 mg/dL (ref >39.00)
LDL Cholesterol: 137 mg/dL — ABNORMAL HIGH (ref 0–99)
NonHDL: 159.59
TRIGLYCERIDES: 111 mg/dL (ref 0.0–149.0)
Total CHOL/HDL Ratio: 4
VLDL: 22.2 mg/dL (ref 0.0–40.0)

## 2015-08-31 LAB — TSH: TSH: 3.09 u[IU]/mL (ref 0.35–4.50)

## 2015-08-31 LAB — HEMOGLOBIN A1C: Hgb A1c MFr Bld: 6.5 % (ref 4.6–6.5)

## 2015-09-07 ENCOUNTER — Encounter: Payer: Self-pay | Admitting: Family Medicine

## 2015-09-07 ENCOUNTER — Ambulatory Visit (INDEPENDENT_AMBULATORY_CARE_PROVIDER_SITE_OTHER): Payer: BLUE CROSS/BLUE SHIELD | Admitting: Family Medicine

## 2015-09-07 VITALS — BP 140/62 | HR 92 | Temp 98.3°F | Ht 60.0 in | Wt 180.2 lb

## 2015-09-07 DIAGNOSIS — Z0000 Encounter for general adult medical examination without abnormal findings: Secondary | ICD-10-CM

## 2015-09-07 DIAGNOSIS — Z23 Encounter for immunization: Secondary | ICD-10-CM | POA: Diagnosis not present

## 2015-09-07 DIAGNOSIS — E785 Hyperlipidemia, unspecified: Secondary | ICD-10-CM

## 2015-09-07 MED ORDER — ATORVASTATIN CALCIUM 20 MG PO TABS: 20.0000 mg | ORAL_TABLET | Freq: Every day | ORAL | Status: DC

## 2015-09-07 NOTE — Progress Notes (Signed)
Dr. Frederico Hamman T. Eri Mcevers, MD, [facility] [place], [address] Phone: [phone] Fax: [phone]  09/07/2015  Patient: Robin Bass, MRN: [id number], DOB: [birth year], 64 y.o.  Primary Physician:  Owens Loffler, MD   Chief Complaint  Patient presents with  . Annual Exam   Subjective:   Robin Bass is a 64 y.o. pleasant patient who presents with the following:  Health Maintenance Summary Reviewed and updated, unless pt declines services.  Tobacco History Reviewed. Non-smoker Alcohol: No concerns, no excessive use Exercise Habits: Some activity, rec at least 30 mins 5 times a week STD concerns: none Drug Use: None Birth control method: hyst Menses regular: n/a Lumps or breast concerns: no Breast Cancer Family History: no  Pneumovax-23 Colonoscopy - declines  Father passed away Mom in   Mood ok  Lipids: unstable, DM, no meds Panel reviewed with patient.  Lipids:    Component Value Date/Time   CHOL 210* 08/31/2015 0905   TRIG 111.0 08/31/2015 0905   HDL 50.50 08/31/2015 0905   LDLDIRECT 109.6 05/13/2013 1636   VLDL 22.2 08/31/2015 0905   CHOLHDL 4 08/31/2015 0905    Lab Results  Component Value Date   ALT 13 08/31/2015   AST 14 08/31/2015   ALKPHOS 104 08/31/2015   BILITOT 0.5 08/31/2015     Health Maintenance  Topic Date Due  . Hepatitis C Screening  Feb 01, 1951  . PNEUMOCOCCAL POLYSACCHARIDE VACCINE (1) 12/30/1954  . HIV Screening  12/30/1967  . COLONOSCOPY  12/30/2002  . OPHTHALMOLOGY EXAM  08/08/2015  . FOOT EXAM  08/23/2015  . HEMOGLOBIN A1C  02/28/2016  . INFLUENZA VACCINE  05/24/2016  . MAMMOGRAM  10/13/2016  . TETANUS/TDAP  04/01/2024  . ZOSTAVAX  Addressed    Immunization History  Administered Date(s) Administered  . Influenza Whole 07/31/2008, 07/12/2010  . Influenza, Seasonal, Injecte, Preservative Fre 07/08/2015  . Pneumococcal Conjugate-13 08/21/2014  . Tdap  04/01/2014   Patient Active Problem List   Diagnosis Date Noted  . Diabetes mellitus type 2, controlled, without complications ([place]) 16/60/6301    Priority: High  . Hypothyroidism 07/31/2008    Priority: Medium  . HYPERTENSION 07/31/2008    Priority: Medium  . Hyperlipidemia LDL goal <70 09/07/2015  . Depression 03/31/2012  . Overactive bladder 02/05/2011  . 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 ALLERGIC RHINITIS 07/31/2008  . ASTHMA 07/31/2008  . COPD 07/31/2008  . GERD 07/31/2008   Past Medical History  Diagnosis Date  . Allergy   . Asthma   . COPD (chronic obstructive pulmonary disease) ([place])   . GERD (gastroesophageal reflux disease)   . Hypothyroidism   . Overactive bladder   . History of pituitary tumor   . Prolactin deficiency ([place])   . Diabetes mellitus type 2, controlled, without complications ([place]) 60/07/9322   Past Surgical History  Procedure Laterality Date  . Abdominal hysterectomy     Social History   Social History  . Marital Status: Married    Spouse Name: N/A  . Number of Children: 1  . Years of Education: N/A   Occupational History  . Network engineer    Social History Main Topics  . Smoking status: Never Smoker   . Smokeless tobacco: Never Used  . Alcohol Use: No  . Drug Use: No  . Sexual Activity: Not on file   Other Topics Concern  . Not on file   Social History Narrative   No family history on file.  Allergies  Allergen Reactions  . Sulfonamide Derivatives   . Ace Inhibitors Cough    Medication list has been reviewed and updated.   General: Denies fever, chills, sweats. No significant weight loss. Eyes: Denies blurring,significant itching ENT: Denies earache, sore throat, and hoarseness.  Cardiovascular: Denies chest pains, palpitations, dyspnea on exertion,  Respiratory: Denies cough, dyspnea at rest,wheeezing Breast: no concerns about lumps GI: Denies nausea, vomiting, diarrhea, constipation, change in bowel habits, abdominal  pain, melena, hematochezia GU: Denies dysuria, hematuria, urinary hesitancy, nocturia, denies STD risk, no concerns about discharge Musculoskeletal: Denies back pain, joint pain Derm: recurrent boils, seeing dr. Evorn Gong Neuro: Denies  paresthesias, frequent falls, frequent headaches Psych: Denies depression, anxiety Endocrine: Denies cold intolerance, heat intolerance, polydipsia Heme: Denies enlarged lymph nodes Allergy: No hayfever  Objective:   BP 140/62 mmHg  Pulse 92  Temp(Src) 98.3 F (36.8 C) (Oral)  Ht 5' (1.524 m)  Wt 180 lb 4 oz (81.761 kg)  BMI 35.20 kg/m2 No exam data present  GEN: well developed, well nourished, no acute distress Eyes: conjunctiva and lids normal, PERRLA, EOMI ENT: TM clear, nares clear, oral exam WNL Neck: supple, no lymphadenopathy, no thyromegaly, no JVD Pulm: clear to auscultation and percussion, respiratory effort normal CV: regular rate and rhythm, S1-S2, no murmur, rub or gallop, no bruits Chest: no scars, masses, no lumps BREAST: breast exam normal. Chaperoned examination. Entirety of breast examined including axilla, and no enlarged lymph nodes. No significant masses are felt. No nipple discharge. No skin changes. Overall, reassuring breast exam.  This portion of the physical examination was chaperoned by Hedy Camara, CMA.  GI: soft, non-tender; no hepatosplenomegaly, masses; active bowel sounds all quadrants GU: GU exam declined Lymph: no cervical, axillary or inguinal adenopathy MSK: gait normal, muscle tone and strength WNL, no joint swelling, effusions, discoloration, crepitus  SKIN: clear, good turgor, color WNL, no rashes, lesions, or ulcerations Neuro: normal mental status, normal strength, sensation, and motion Psych: alert; oriented to person, place and time, normally interactive and not anxious or depressed in appearance.   All labs reviewed with patient. Lipids:    Component Value Date/Time   CHOL 210* 08/31/2015 0905    TRIG 111.0 08/31/2015 0905   HDL 50.50 08/31/2015 0905   LDLDIRECT 109.6 05/13/2013 1636   VLDL 22.2 08/31/2015 0905   CHOLHDL 4 08/31/2015 0905   CBC: CBC Latest Ref Rng 08/31/2015 03/27/2014 05/13/2013  WBC 4.0 - 10.5 K/uL 8.4 7.9 9.4  Hemoglobin 12.0 - 15.0 g/dL 13.1 13.1 13.1  Hematocrit 36.0 - 46.0 % 39.5 39.6 39.2  Platelets 150.0 - 400.0 K/uL 344.0 349.0 948.0    Basic Metabolic Panel:    Component Value Date/Time   NA 140 08/31/2015 0905   K 3.6 08/31/2015 0905   CL 100 08/31/2015 0905   CO2 30 08/31/2015 0905   BUN 15 08/31/2015 0905   CREATININE 0.69 08/31/2015 0905   GLUCOSE 114* 08/31/2015 0905   CALCIUM 10.0 08/31/2015 0905   Hepatic Function Latest Ref Rng 08/31/2015 03/27/2014 05/13/2013  Total Protein 6.0 - 8.3 g/dL 7.4 7.7 8.0  Albumin 3.5 - 5.2 g/dL 4.0 4.0 3.9  AST 0 - 37 U/L 14 19 16   ALT 0 - 35 U/L 13 21 10   Alk Phosphatase 39 - 117 U/L 104 101 96  Total Bilirubin 0.2 - 1.2 mg/dL 0.5 0.5 0.1(L)  Bilirubin, Direct 0.0 - 0.3 mg/dL 0.1 0.1 0.0    Lab Results  Component Value Date   TSH 3.09  08/31/2015   No results found.  Assessment and Plan:   Routine general medical examination at a health care facility  Hyperlipidemia LDL goal <70 Start lipitor 20 mg, f/u 6 months to check liver and lipids  Health Maintenance Exam: The patient's preventative maintenance and recommended screening tests for an annual wellness exam were reviewed in full today. Brought up to date unless services declined.  Counselled on the importance of diet, exercise, and its role in overall health and mortality. The patient's FH and SH was reviewed, including their home life, tobacco status, and drug and alcohol status.  Follow-up: Return in about 6 months (around 03/06/2016). Or follow-up in 1 year for complete physical examination  New Prescriptions   ATORVASTATIN (LIPITOR) 20 MG TABLET    Take 1 tablet (20 mg total) by mouth daily.   Modified Medications   No medications on  file   No orders of the defined types were placed in this encounter.    Signed,  [name]. Leshea Jaggers, MD   Patient's Medications  New Prescriptions   ATORVASTATIN (LIPITOR) 20 MG TABLET    Take 1 tablet (20 mg total) by mouth daily.  Previous Medications   ALBUTEROL (PROAIR HFA) 108 (90 BASE) MCG/ACT INHALER    Inhale 2 puffs into the lungs every 6 (six) hours as needed for wheezing.   AMLODIPINE (NORVASC) 10 MG TABLET    TAKE ONE TABLET BY MOUTH ONE TIME DAILY   BENZONATATE (TESSALON) 200 MG CAPSULE    Take 1 capsule (200 mg total) by mouth 3 (three) times daily as needed for cough.   CLONAZEPAM (KLONOPIN) 0.5 MG TABLET    Take 1 tablet (0.5 mg total) by mouth 2 (two) times daily as needed for anxiety.   FLUOXETINE (PROZAC) 20 MG CAPSULE    TAKE ONE CAPSULE BY MOUTH ONE TIME DAILY   LEVOTHYROXINE (SYNTHROID, LEVOTHROID) 50 MCG TABLET    TAKE ONE TABLET BY MOUTH ONE TIME DAILY   LOSARTAN-HYDROCHLOROTHIAZIDE (HYZAAR) 100-25 MG PER TABLET    TAKE ONE TABLET BY MOUTH ONE TIME DAILY   METFORMIN (GLUCOPHAGE) 500 MG TABLET    TAKE ONE TABLET BY MOUTH ONE TIME DAILY   ONE TOUCH ULTRA TEST TEST STRIP    USE TO CHECK BLOOD SUGAR TWICE A DAY AND AS DIRECTED   ONETOUCH DELICA LANCETS 35W MISC    Use to check blood sugar two times a day   PREMARIN VAGINAL CREAM    every other day as needed.   Modified Medications   No medications on file  Discontinued Medications   No medications on file

## 2015-09-07 NOTE — Addendum Note (Signed)
Addended by: Carter Kitten on: 09/07/2015 09:47 AM   Modules accepted: Orders

## 2015-09-07 NOTE — Progress Notes (Signed)
Pre visit review using our clinic review tool, if applicable. No additional management support is needed unless otherwise documented below in the visit note. 

## 2015-09-11 ENCOUNTER — Telehealth: Payer: Self-pay

## 2015-09-11 NOTE — Telephone Encounter (Signed)
PLEASE NOTE: All timestamps contained within this report are represented as [place] Standard Time. CONFIDENTIALTY NOTICE: This fax transmission is intended only for the addressee. It contains information that is legally privileged, confidential or otherwise protected from use or disclosure. If you are not the intended recipient, you are strictly prohibited from reviewing, disclosing, copying using or disseminating any of this information or taking any action in reliance on or regarding this information. If you have received this fax in error, please notify us immediately by telephone so that we can arrange for its return to [place]. Phone: [phone], Toll-Free: [phone], Fax: [phone] Page: 1 of 2 Call Id: [id number] [place] Patient Name: Robin Bass Gender: Female DOB: Aug 26, 1951 Age: 64 Y 8 M 10 D Return Phone Number: [phone] (Primary), [phone] (Secondary) Address: City/State/Zip: [place] Day - Client Client Site [place] - Day Physician [place], [place] Contact Type Call Call Type Triage / Clinical Relationship To Patient Self Appointment Disposition EMR Appointment Not Necessary Info pasted into Epic No Return Phone Number [phone] (Primary) Chief Complaint Medication reaction Initial Comment Caller states they had a physical on Monday. She also received a pneumonia immunization. Now she is having nausea along with diarrhea. She had been prescribed a new medication as well. PreDisposition Go to Urgent Care/Walk-In Clinic Nurse Assessment Nurse: Denyse Amass, RN, Benjamine Mola Date/Time Eilene Ghazi Time): 09/11/2015 4:17:25 PM Confirm and document reason for call. If symptomatic, describe symptoms. ---Patient states she has diarrhea for 2 days. States her stomach has been very crumbly. States she has had watery  diarrhea 6 times today. States she got a pneumonia shot 4 days ago and was given Lipitor, a new medication. Has the patient traveled out of the country within the last 30 days? ---Not Applicable Does the patient have any new or worsening symptoms? ---Yes Will a triage be completed? ---Yes Related visit to physician within the last 2 weeks? ---No Does the PT have any chronic conditions? (i.e. diabetes, asthma, etc.) ---Yes List chronic conditions. ---Diagetic High Cholesterol Hypothyroidism HTN Is this a behavioral health call? ---No Guidelines Guideline Title Affirmed Question Affirmed Notes Nurse Date/Time (Eastern Time) Diarrhea [1] MODERATE diarrhea (e.g., 4-6 times / day more than normal) AND [2] present > 48 hours (2 days) [place], RN, Benjamine Mola 09/11/2015 4:23:09 PM Disp. Time Eilene Ghazi Time) Disposition Final User PLEASE NOTE: All timestamps contained within this report are represented as [place] Standard Time. CONFIDENTIALTY NOTICE: This fax transmission is intended only for the addressee. It contains information that is legally privileged, confidential or otherwise protected from use or disclosure. If you are not the intended recipient, you are strictly prohibited from reviewing, disclosing, copying using or disseminating any of this information or taking any action in reliance on or regarding this information. If you have received this fax in error, please notify us immediately by telephone so that we can arrange for its return to [place]. Phone: [phone], Toll-Free: [phone], Fax: [phone] Page: 2 of 2 Call Id: [id number] 09/11/2015 4:27:19 PM See Physician within 24 Hours Yes [place], RN, Lorin Glass Understands: Yes Disagree/Comply: Comply Care Advice Given Per Guideline SEE PHYSICIAN WITHIN 24 HOURS: * IF OFFICE WILL BE OPEN: You need to be seen within the next 24 hours. Call your doctor when the office opens, and make an appointment. * IF OFFICE  WILL BE CLOSED AND NO PCP TRIAGE: You need to be seen within the  next 24 hours. An urgent care center is often a good source of care if your doctor's office is closed. CLEAR FLUIDS: * Drink more fluids. * Sip water or a half-strength sports drink (Gatorade or Powerade; mix half and half with water) * Other options: oral rehydration solution (Pedialyte or Rehydralyte) . CALL BACK IF: * Signs of dehydration occur (e.g., no urine over 12 hours, very dry mouth, lightheaded, etc.) * Bloody stools * Constant or severe abdominal pain * You become worse. After Care Instructions Given Call Event Type User Date / Time Description Referrals GO TO FACILITY UNDECIDED

## 2015-09-13 NOTE — Telephone Encounter (Signed)
Please call and check on her  Extremely doubtful diarrhea caused by either a pneumonia vaccine or lipitor  There is quite a lot of gi illness in the community right now, so more likely gastroenteritis. Hope she is feeling better.

## 2015-09-14 NOTE — Telephone Encounter (Signed)
Ms. Onsurez notified as instructed by telephone.  She states she did go to [facility] Saturday morning and all they could come up with was that it was stress related.  She has had no fever or vomiting, just watery diarrhea.  [facility] advised her to use Imodium AD which she states has helped.  She did stop her cholesterol medication just to be on the safe side.  She is travel[place] tomorrow for [place] and states she will restart cholesterol medication once she is back in town.

## 2015-09-14 NOTE — Telephone Encounter (Signed)
Left message for Mrs. Robin Bass to return my call.

## 2015-10-08 ENCOUNTER — Other Ambulatory Visit: Payer: Self-pay | Admitting: Family Medicine

## 2015-10-27 ENCOUNTER — Encounter: Payer: Self-pay | Admitting: Family Medicine

## 2015-11-08 ENCOUNTER — Other Ambulatory Visit: Payer: Self-pay | Admitting: Family Medicine

## 2016-03-07 ENCOUNTER — Encounter: Payer: Self-pay | Admitting: Family Medicine

## 2016-03-07 ENCOUNTER — Ambulatory Visit (INDEPENDENT_AMBULATORY_CARE_PROVIDER_SITE_OTHER): Payer: BLUE CROSS/BLUE SHIELD | Admitting: Family Medicine

## 2016-03-07 VITALS — BP 120/62 | HR 81 | Temp 98.4°F | Ht 60.0 in | Wt 178.5 lb

## 2016-03-07 DIAGNOSIS — I10 Essential (primary) hypertension: Secondary | ICD-10-CM | POA: Diagnosis not present

## 2016-03-07 DIAGNOSIS — E785 Hyperlipidemia, unspecified: Secondary | ICD-10-CM

## 2016-03-07 DIAGNOSIS — E119 Type 2 diabetes mellitus without complications: Secondary | ICD-10-CM | POA: Diagnosis not present

## 2016-03-07 LAB — HEPATIC FUNCTION PANEL
ALK PHOS: 109 U/L (ref 39–117)
ALT: 16 U/L (ref 0–35)
AST: 17 U/L (ref 0–37)
Albumin: 4.3 g/dL (ref 3.5–5.2)
BILIRUBIN DIRECT: 0.1 mg/dL (ref 0.0–0.3)
BILIRUBIN TOTAL: 0.3 mg/dL (ref 0.2–1.2)
Total Protein: 7.7 g/dL (ref 6.0–8.3)

## 2016-03-07 LAB — HEMOGLOBIN A1C: Hgb A1c MFr Bld: 6.8 % — ABNORMAL HIGH (ref 4.6–6.5)

## 2016-03-07 NOTE — Progress Notes (Signed)
Dr. Frederico Hamman T. Joshawn Crissman, MD, [facility] [place], [address] Phone: [phone] Fax: [phone]  03/07/2016  Patient: Robin Bass, MRN: [id number], DOB: May 06, 1951, 65 y.o.  Primary Physician:  Owens Loffler, MD   Chief Complaint  Patient presents with  . Follow-up    6 month   Subjective:   [name] is a 65 y.o. very pleasant female patient who presents with the following:  F/u   Diabetes Mellitus: Tolerating Medications: yes Compliance with diet: fair Exercise: minimal / intermittent Avg blood sugars at home: 90-160 Foot problems: none Hypoglycemia: none No nausea, vomitting, blurred vision, polyuria.  Lab Results  Component Value Date   HGBA1C 6.5 08/31/2015   HGBA1C 6.5 02/23/2015   HGBA1C 6.7* 08/21/2014   Lab Results  Component Value Date   MICROALBUR 1.1 08/31/2015   LDLCALC 137* 08/31/2015   CREATININE 0.69 08/31/2015    Wt Readings from Last 3 Encounters:  03/07/16 178 lb 8 oz (80.967 kg)  09/07/15 180 lb 4 oz (81.761 kg)  02/23/15 172 lb (78.019 kg)    Body mass index is 34.86 kg/(m^2).   HTN: Tolerating all medications without side effects Stable and at goal No CP, no sob. No HA.  BP Readings from Last 3 Encounters:  03/07/16 120/62  09/07/15 140/62  02/23/15 99991111    Basic Metabolic Panel:    Component Value Date/Time   NA 140 08/31/2015 0905   K 3.6 08/31/2015 0905   CL 100 08/31/2015 0905   CO2 30 08/31/2015 0905   BUN 15 08/31/2015 0905   CREATININE 0.69 08/31/2015 0905   GLUCOSE 114* 08/31/2015 0905   CALCIUM 10.0 08/31/2015 0905   Lipids: Doing well, stable. Tolerating meds fine with no SE. Panel reviewed with patient.  Lipids:    Component Value Date/Time   CHOL 210* 08/31/2015 0905   TRIG 111.0 08/31/2015 0905   HDL 50.50 08/31/2015 0905   LDLDIRECT 109.6 05/13/2013 1636   VLDL 22.2 08/31/2015 0905   CHOLHDL 4 08/31/2015 0905    Lab Results    Component Value Date   ALT 13 08/31/2015   AST 14 08/31/2015   ALKPHOS 104 08/31/2015   BILITOT 0.5 08/31/2015       Past Medical History, Surgical History, Social History, Family History, Problem List, Medications, and Allergies have been reviewed and updated if relevant.  Patient Active Problem List   Diagnosis Date Noted  . Diabetes mellitus type 2, controlled, without complications ([place]) 99991111    Priority: High  . Hypothyroidism 07/31/2008    Priority: Medium  . HYPERTENSION 07/31/2008    Priority: Medium  . Hyperlipidemia LDL goal <70 09/07/2015  . Depression 03/31/2012  . Overactive bladder 02/05/2011  . 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 ALLERGIC RHINITIS 07/31/2008  . ASTHMA 07/31/2008  . COPD 07/31/2008  . GERD 07/31/2008    Past Medical History  Diagnosis Date  . Allergy   . Asthma   . COPD (chronic obstructive pulmonary disease) ([place])   . GERD (gastroesophageal reflux disease)   . Hypothyroidism   . Overactive bladder   . History of pituitary tumor   . Prolactin deficiency ([place])   . Diabetes mellitus type 2, controlled, without complications ([place]) 99991111    Past Surgical History  Procedure Laterality Date  . Abdominal hysterectomy      Social History   Social History  . Marital Status: Married    Spouse Name: N/A  .  Number of Children: 1  . Years of Education: N/A   Occupational History  . Network engineer    Social History Main Topics  . Smoking status: Never Smoker   . Smokeless tobacco: Never Used  . Alcohol Use: No  . Drug Use: No  . Sexual Activity: Not on file   Other Topics Concern  . Not on file   Social History Narrative    No family history on file.  Allergies  Allergen Reactions  . Sulfonamide Derivatives   . Ace Inhibitors Cough    Medication list reviewed and updated in full in [place].   GEN: No acute illnesses, no fevers, chills. GI: No n/v/d, eating normally Pulm: No SOB Interactive and  getting along well at home.  Otherwise, ROS is as per the HPI.  Objective:   BP 120/62 mmHg  Pulse 81  Temp(Src) 98.4 F (36.9 C) (Oral)  Ht 5' (1.524 m)  Wt 178 lb 8 oz (80.967 kg)  BMI 34.86 kg/m2  GEN: WDWN, NAD, Non-toxic, A & O x 3 HEENT: Atraumatic, Normocephalic. Neck supple. No masses, No LAD. Ears and Nose: No external deformity. CV: RRR, No M/G/R. No JVD. No thrill. No extra heart sounds. PULM: CTA B, no wheezes, crackles, rhonchi. No retractions. No resp. distress. No accessory muscle use. EXTR: No c/c/e NEURO Normal gait.  PSYCH: Normally interactive. Conversant. Not depressed or anxious appearing.  Calm demeanor.   Laboratory and Imaging Data:  Assessment and Plan:   Controlled type 2 diabetes mellitus without complication, without long-term current use of insulin (HCC) - Plan: Hemoglobin A1c  Hyperlipidemia LDL goal <70 - Plan: Hepatic function panel  Essential hypertension  >25 minutes spent in face to face time with patient, >50% spent in counselling or coordination of care  All of above are doing well and stable. Check labs.  Additional time spent in discussion regarding her husband's health / her concern.  Follow-up: Return in about 6 months (around 09/07/2016) for CPX (30 min).  Orders Placed This Encounter  Procedures  . Hemoglobin A1c  . Hepatic function panel    Signed,  Frederico Hamman T. Broadus Costilla, MD   Patient's Medications  New Prescriptions   No medications on file  Previous Medications   ALBUTEROL (PROAIR HFA) 108 (90 BASE) MCG/ACT INHALER    Inhale 2 puffs into the lungs every 6 (six) hours as needed for wheezing.   AMLODIPINE (NORVASC) 10 MG TABLET    TAKE ONE TABLET BY MOUTH ONE TIME DAILY   ATORVASTATIN (LIPITOR) 20 MG TABLET    Take 1 tablet (20 mg total) by mouth daily.   BENZONATATE (TESSALON) 200 MG CAPSULE    Take 1 capsule (200 mg total) by mouth 3 (three) times daily as needed for cough.   CLONAZEPAM (KLONOPIN) 0.5 MG TABLET     Take 1 tablet (0.5 mg total) by mouth 2 (two) times daily as needed for anxiety.   FLUOXETINE (PROZAC) 20 MG CAPSULE    TAKE ONE CAPSULE BY MOUTH ONE TIME DAILY   LEVOTHYROXINE (SYNTHROID, LEVOTHROID) 50 MCG TABLET    TAKE ONE TABLET BY MOUTH ONE TIME DAILY   LOSARTAN-HYDROCHLOROTHIAZIDE (HYZAAR) 100-25 MG TABLET    TAKE ONE TABLET BY MOUTH ONE TIME DAILY   METFORMIN (GLUCOPHAGE) 500 MG TABLET    TAKE ONE TABLET BY MOUTH ONE TIME DAILY   ONE TOUCH ULTRA TEST TEST STRIP    USE TO CHECK BLOOD SUGAR TWICE A DAY AND AS DIRECTED   ONETOUCH  DELICA LANCETS 99991111 MISC    Use to check blood sugar two times a day  Modified Medications   No medications on file  Discontinued Medications   PREMARIN VAGINAL CREAM    every other day as needed.

## 2016-03-07 NOTE — Progress Notes (Signed)
Pre visit review using our clinic review tool, if applicable. No additional management support is needed unless otherwise documented below in the visit note. 

## 2016-03-08 ENCOUNTER — Encounter: Payer: Self-pay | Admitting: *Deleted

## 2016-03-26 ENCOUNTER — Other Ambulatory Visit: Payer: Self-pay | Admitting: Family Medicine

## 2016-03-31 ENCOUNTER — Other Ambulatory Visit: Payer: Self-pay | Admitting: Family Medicine

## 2016-05-07 ENCOUNTER — Other Ambulatory Visit: Payer: Self-pay | Admitting: Family Medicine

## 2016-06-24 ENCOUNTER — Other Ambulatory Visit: Payer: Self-pay | Admitting: Family Medicine

## 2016-08-30 ENCOUNTER — Other Ambulatory Visit: Payer: Self-pay | Admitting: Family Medicine

## 2016-08-30 DIAGNOSIS — Z1159 Encounter for screening for other viral diseases: Secondary | ICD-10-CM

## 2016-08-30 DIAGNOSIS — E119 Type 2 diabetes mellitus without complications: Secondary | ICD-10-CM

## 2016-08-30 DIAGNOSIS — Z79899 Other long term (current) drug therapy: Secondary | ICD-10-CM

## 2016-08-30 DIAGNOSIS — E7849 Other hyperlipidemia: Secondary | ICD-10-CM

## 2016-08-30 DIAGNOSIS — E039 Hypothyroidism, unspecified: Secondary | ICD-10-CM

## 2016-08-30 DIAGNOSIS — Z114 Encounter for screening for human immunodeficiency virus [HIV]: Secondary | ICD-10-CM

## 2016-09-05 ENCOUNTER — Other Ambulatory Visit (INDEPENDENT_AMBULATORY_CARE_PROVIDER_SITE_OTHER): Payer: BLUE CROSS/BLUE SHIELD

## 2016-09-05 DIAGNOSIS — E7849 Other hyperlipidemia: Secondary | ICD-10-CM

## 2016-09-05 DIAGNOSIS — Z1159 Encounter for screening for other viral diseases: Secondary | ICD-10-CM

## 2016-09-05 DIAGNOSIS — Z114 Encounter for screening for human immunodeficiency virus [HIV]: Secondary | ICD-10-CM

## 2016-09-05 DIAGNOSIS — E119 Type 2 diabetes mellitus without complications: Secondary | ICD-10-CM | POA: Diagnosis not present

## 2016-09-05 DIAGNOSIS — Z79899 Other long term (current) drug therapy: Secondary | ICD-10-CM

## 2016-09-05 DIAGNOSIS — E039 Hypothyroidism, unspecified: Secondary | ICD-10-CM | POA: Diagnosis not present

## 2016-09-05 DIAGNOSIS — E784 Other hyperlipidemia: Secondary | ICD-10-CM

## 2016-09-05 LAB — CBC WITH DIFFERENTIAL/PLATELET
Basophils Absolute: 0 10*3/uL (ref 0.0–0.1)
Basophils Relative: 0.4 % (ref 0.0–3.0)
EOS PCT: 2.8 % (ref 0.0–5.0)
Eosinophils Absolute: 0.2 10*3/uL (ref 0.0–0.7)
HCT: 38.4 % (ref 36.0–46.0)
HEMOGLOBIN: 12.8 g/dL (ref 12.0–15.0)
Lymphocytes Relative: 30.6 % (ref 12.0–46.0)
Lymphs Abs: 2.5 10*3/uL (ref 0.7–4.0)
MCHC: 33.2 g/dL (ref 30.0–36.0)
MCV: 75 fl — ABNORMAL LOW (ref 78.0–100.0)
MONO ABS: 0.3 10*3/uL (ref 0.1–1.0)
MONOS PCT: 4.2 % (ref 3.0–12.0)
Neutro Abs: 5 10*3/uL (ref 1.4–7.7)
Neutrophils Relative %: 62 % (ref 43.0–77.0)
Platelets: 327 10*3/uL (ref 150.0–400.0)
RBC: 5.12 Mil/uL — AB (ref 3.87–5.11)
RDW: 15.9 % — ABNORMAL HIGH (ref 11.5–15.5)
WBC: 8.1 10*3/uL (ref 4.0–10.5)

## 2016-09-05 LAB — HEPATIC FUNCTION PANEL
ALBUMIN: 4.1 g/dL (ref 3.5–5.2)
ALT: 13 U/L (ref 0–35)
AST: 13 U/L (ref 0–37)
Alkaline Phosphatase: 114 U/L (ref 39–117)
Bilirubin, Direct: 0.1 mg/dL (ref 0.0–0.3)
TOTAL PROTEIN: 7.3 g/dL (ref 6.0–8.3)
Total Bilirubin: 0.4 mg/dL (ref 0.2–1.2)

## 2016-09-05 LAB — LIPID PANEL
CHOL/HDL RATIO: 3
Cholesterol: 135 mg/dL (ref 0–200)
HDL: 49.4 mg/dL (ref >39.00)
LDL CALC: 67 mg/dL (ref 0–99)
NONHDL: 85.24
Triglycerides: 89 mg/dL (ref 0.0–149.0)
VLDL: 17.8 mg/dL (ref 0.0–40.0)

## 2016-09-05 LAB — BASIC METABOLIC PANEL
BUN: 17 mg/dL (ref 6–23)
CO2: 30 mEq/L (ref 19–32)
Calcium: 9.6 mg/dL (ref 8.4–10.5)
Chloride: 103 mEq/L (ref 96–112)
Creatinine, Ser: 0.65 mg/dL (ref 0.40–1.20)
GFR: 97.63 mL/min (ref >60.00)
Glucose, Bld: 130 mg/dL — ABNORMAL HIGH (ref 70–99)
POTASSIUM: 3.4 meq/L — AB (ref 3.5–5.1)
SODIUM: 142 meq/L (ref 135–145)

## 2016-09-05 LAB — HEMOGLOBIN A1C: HEMOGLOBIN A1C: 6.7 % — AB (ref 4.6–6.5)

## 2016-09-05 LAB — T3, FREE: T3 FREE: 3.4 pg/mL (ref 2.3–4.2)

## 2016-09-05 LAB — TSH: TSH: 3.85 u[IU]/mL (ref 0.35–4.50)

## 2016-09-05 LAB — MICROALBUMIN / CREATININE URINE RATIO
Creatinine,U: 127.3 mg/dL
MICROALB UR: 0.8 mg/dL (ref 0.0–1.9)
Microalb Creat Ratio: 0.6 mg/g (ref 0.0–30.0)

## 2016-09-05 LAB — T4, FREE: FREE T4: 0.98 ng/dL (ref 0.60–1.60)

## 2016-09-06 LAB — HIV ANTIBODY (ROUTINE TESTING W REFLEX): HIV 1&2 Ab, 4th Generation: NONREACTIVE

## 2016-09-06 LAB — HEPATITIS C ANTIBODY: HCV AB: NEGATIVE

## 2016-09-07 ENCOUNTER — Other Ambulatory Visit: Payer: Self-pay | Admitting: Family Medicine

## 2016-09-12 ENCOUNTER — Ambulatory Visit (INDEPENDENT_AMBULATORY_CARE_PROVIDER_SITE_OTHER): Payer: BLUE CROSS/BLUE SHIELD | Admitting: Family Medicine

## 2016-09-12 ENCOUNTER — Encounter: Payer: Self-pay | Admitting: Family Medicine

## 2016-09-12 VITALS — BP 120/60 | HR 99 | Temp 98.5°F | Ht 59.75 in | Wt 176.5 lb

## 2016-09-12 DIAGNOSIS — Z0000 Encounter for general adult medical examination without abnormal findings: Secondary | ICD-10-CM | POA: Diagnosis not present

## 2016-09-12 MED ORDER — METFORMIN HCL ER 500 MG PO TB24: 500.0000 mg | ORAL_TABLET | Freq: Every day | ORAL | 3 refills | Status: DC

## 2016-09-12 NOTE — Progress Notes (Signed)
Pre visit review using our clinic review tool, if applicable. No additional management support is needed unless otherwise documented below in the visit note. 

## 2016-09-12 NOTE — Progress Notes (Signed)
Dr. Frederico Hamman T. Lyndol Vanderheiden, MD, [facility] and Sports Medicine [place], [address] Phone: [phone] Fax: [phone]  09/12/2016  Patient: Robin Bass, MRN: [id number], DOB: 15-Oct-1951, 65 y.o.  Primary Physician:  Owens Loffler, MD   Chief Complaint  Patient presents with  . Annual Exam   Subjective:   [name] is a 65 y.o. pleasant patient who presents with the following:  Health Maintenance Summary Reviewed and updated, unless pt declines services.  Tobacco History Reviewed. Non-smoker Alcohol: No concerns, no excessive use Exercise Habits: Some activity, rec at least 30 mins 5 times a week STD concerns: none Drug Use: None Birth control method: N/A Menses regular: N/A Lumps or breast concerns: no Breast Cancer Family History: no  Health Maintenance  Topic Date Due  . COLONOSCOPY  12/30/2002  . OPHTHALMOLOGY EXAM  08/08/2015  . FOOT EXAM  08/23/2015  . HEMOGLOBIN A1C  03/05/2017  . MAMMOGRAM  10/19/2017  . PNEUMOCOCCAL POLYSACCHARIDE VACCINE (2) 09/06/2020  . TETANUS/TDAP  04/01/2024  . INFLUENZA VACCINE  Addressed  . ZOSTAVAX  Addressed  . Hepatitis C Screening  Completed  . HIV Screening  Completed    Immunization History  Administered Date(s) Administered  . Influenza Whole 07/31/2008, 07/12/2010  . Influenza, Seasonal, Injecte, Preservative Fre 07/08/2015, 07/26/2016  . Pneumococcal Conjugate-13 08/21/2014  . Pneumococcal Polysaccharide-23 09/07/2015  . Tdap 04/01/2014   Patient Active Problem List   Diagnosis Date Noted  . Diabetes mellitus type 2, controlled, without complications ([place]) 02/63/7858    Priority: High  . Hypothyroidism 07/31/2008    Priority: Medium  . Essential hypertension 07/31/2008    Priority: Medium  . Hyperlipidemia LDL goal <70 09/07/2015  . Depression 03/31/2012  . Overactive bladder 02/05/2011  . 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 ALLERGIC RHINITIS 07/31/2008  .  ASTHMA 07/31/2008  . COPD 07/31/2008  . GERD 07/31/2008   Past Medical History:  Diagnosis Date  . Allergy   . Asthma   . COPD (chronic obstructive pulmonary disease) ([place])   . Diabetes mellitus type 2, controlled, without complications ([place]) 85/11/7739  . GERD (gastroesophageal reflux disease)   . History of pituitary tumor   . Hypothyroidism   . Overactive bladder   . Prolactin deficiency [facility])    Past Surgical History:  Procedure Laterality Date  . ABDOMINAL HYSTERECTOMY     Social History   Social History  . Marital status: Married    Spouse name: N/A  . Number of children: 1  . Years of education: N/A   Occupational History  . Network engineer Unemployed   Social History Main Topics  . Smoking status: Never Smoker  . Smokeless tobacco: Never Used  . Alcohol use No  . Drug use: No  . Sexual activity: Not on file   Other Topics Concern  . Not on file   Social History Narrative  . No narrative on file   No family history on file. Allergies  Allergen Reactions  . Sulfonamide Derivatives   . Ace Inhibitors Cough    Medication list has been reviewed and updated.   General: Denies fever, chills, sweats. No significant weight loss. Eyes: Denies blurring,significant itching ENT: Denies earache, sore throat, and hoarseness.  Cardiovascular: Denies chest pains, palpitations, dyspnea on exertion,  Respiratory: Denies cough, dyspnea at rest,wheeezing Breast: no concerns about lumps GI: Denies nausea, vomiting, diarrhea, constipation, change in bowel habits, abdominal pain, melena, hematochezia GU: Denies dysuria, hematuria, urinary hesitancy, nocturia, denies  STD risk, no concerns about discharge Musculoskeletal: Denies back pain, joint pain Derm: Denies rash, itching Neuro: Denies  paresthesias, frequent falls, frequent headaches Psych:  A GREAT DEAL OF STRESS AT HOME Endocrine: Denies cold intolerance, heat intolerance, polydipsia Heme: Denies enlarged lymph  nodes Allergy: No hayfever  Objective:   BP 120/60   Pulse 99   Temp 98.5 F (36.9 C) (Oral)   Ht 4' 11.75" (1.518 m)   Wt 176 lb 8 oz (80.1 kg)   BMI 34.76 kg/m  No exam data present  GEN: well developed, well nourished, no acute distress Eyes: conjunctiva and lids normal, PERRLA, EOMI ENT: TM clear, nares clear, oral exam WNL Neck: supple, no lymphadenopathy, no thyromegaly, no JVD Pulm: clear to auscultation and percussion, respiratory effort normal CV: regular rate and rhythm, S1-S2, no murmur, rub or gallop, no bruits Chest: no scars, masses, no lumps BREAST: breast exam normal. Chaperoned examination. Entirety of breast examined including axilla, and no enlarged lymph nodes. No significant masses are felt. No nipple discharge. No skin changes. Overall, reassuring breast exam.  This portion of the physical examination was chaperoned by Hedy Camara, CMA.  GI: soft, non-tender; no hepatosplenomegaly, masses; active bowel sounds all quadrants GU: GU exam declined Lymph: no cervical, axillary or inguinal adenopathy MSK: gait normal, muscle tone and strength WNL, no joint swelling, effusions, discoloration, crepitus  SKIN: clear, good turgor, color WNL, no rashes, lesions, or ulcerations Neuro: normal mental status, normal strength, sensation, and motion Psych: alert; oriented to person, place and time, normally interactive and not anxious or depressed in appearance.   All labs reviewed with patient. Lipids:    Component Value Date/Time   CHOL 135 09/05/2016 0812   TRIG 89.0 09/05/2016 0812   HDL 49.40 09/05/2016 0812   LDLDIRECT 109.6 05/13/2013 1636   VLDL 17.8 09/05/2016 0812   CHOLHDL 3 09/05/2016 0812   CBC: CBC Latest Ref Rng & Units 09/05/2016 08/31/2015 03/27/2014  WBC 4.0 - 10.5 K/uL 8.1 8.4 7.9  Hemoglobin 12.0 - 15.0 g/dL 12.8 13.1 13.1  Hematocrit 36.0 - 46.0 % 38.4 39.5 39.6  Platelets 150.0 - 400.0 K/uL 327.0 344.0 912.2    Basic Metabolic Panel:      Component Value Date/Time   NA 142 09/05/2016 0812   K 3.4 (L) 09/05/2016 0812   CL 103 09/05/2016 0812   CO2 30 09/05/2016 0812   BUN 17 09/05/2016 0812   CREATININE 0.65 09/05/2016 0812   GLUCOSE 130 (H) 09/05/2016 0812   CALCIUM 9.6 09/05/2016 0812   Hepatic Function Latest Ref Rng & Units 09/05/2016 03/07/2016 08/31/2015  Total Protein 6.0 - 8.3 g/dL 7.3 7.7 7.4  Albumin 3.5 - 5.2 g/dL 4.1 4.3 4.0  AST 0 - 37 U/L _0 ALT 0 - 35 U/L _1 Alk Phosphatase 39 - 117 U/L 114 109 104  Total Bilirubin 0.2 - 1.2 mg/dL 0.4 0.3 0.5  Bilirubin, Direct 0.0 - 0.3 mg/dL 0.1 0.1 0.1    Lab Results  Component Value Date   TSH 3.85 09/05/2016   No results found.  Assessment and Plan:   Health Maintenance Exam: The patient's preventative maintenance and recommended screening tests for an annual wellness exam were reviewed in full today. Brought up to date unless services declined.  Counselled on the importance of diet, exercise, and its role in overall health and mortality. The patient's FH and SH was reviewed, including their home life, tobacco status, and drug and alcohol  status.  Follow-up in 1 year for physical exam or additional follow-up below.  Routine general medical examination at a health care facility   A great deal of stress about that including stress at home and stress with her husband.  We did our best to counsel her about that.  Follow-up: No Follow-up on file.  Meds ordered this encounter  Medications  . metFORMIN (GLUCOPHAGE-XR) 500 MG 24 hr tablet    Sig: Take 1 tablet (500 mg total) by mouth daily with breakfast.    Dispense:  90 tablet    Refill:  3   Medications Discontinued During This Encounter  Medication Reason  . metFORMIN (GLUCOPHAGE) 500 MG tablet    Signed,  Yoandri Congrove T. Giliana Vantil, MD     Medication List       Accurate as of 09/12/16 11:59 PM. Always use your most recent med list.          albuterol 108 (90 Base) MCG/ACT  inhaler Commonly known as:  PROAIR HFA Inhale 2 puffs into the lungs every 6 (six) hours as needed for wheezing.   amLODipine 10 MG tablet Commonly known as:  NORVASC TAKE ONE TABLET BY MOUTH ONE TIME DAILY   atorvastatin 20 MG tablet Commonly known as:  LIPITOR TAKE 1 TABLET BY MOUTH ONCE DAILY   benzonatate 200 MG capsule Commonly known as:  TESSALON Take 1 capsule (200 mg total) by mouth 3 (three) times daily as needed for cough.   clonazePAM 0.5 MG tablet Commonly known as:  KLONOPIN Take 1 tablet (0.5 mg total) by mouth 2 (two) times daily as needed for anxiety.   FLUoxetine 20 MG capsule Commonly known as:  PROZAC TAKE ONE CAPSULE BY MOUTH ONE TIME DAILY   levothyroxine 50 MCG tablet Commonly known as:  SYNTHROID, LEVOTHROID TAKE ONE TABLET BY MOUTH ONE TIME DAILY   losartan-hydrochlorothiazide 100-25 MG tablet Commonly known as:  HYZAAR TAKE ONE TABLET BY MOUTH ONE TIME DAILY   metFORMIN 500 MG 24 hr tablet Commonly known as:  GLUCOPHAGE-XR Take 1 tablet (500 mg total) by mouth daily with breakfast.   ONE TOUCH ULTRA TEST test strip Generic drug:  glucose blood USE TO CHECK BLOOD SUGAR TWICE A DAY AND AS DIRECTED   ONETOUCH DELICA LANCETS 46X Misc Use to check blood sugar two times a day

## 2016-10-31 ENCOUNTER — Telehealth: Payer: Self-pay | Admitting: Family Medicine

## 2016-10-31 NOTE — Telephone Encounter (Signed)
I think always billed under screening colonic neoplasm code

## 2016-10-31 NOTE — Telephone Encounter (Signed)
Pt is calling about cologuard test.  She is concerned that the test will be billed under something other than a preventative code.  She states her insurance will not cover it unless it is preventive.  Pt request cb regarding this.   [phone] Thanks

## 2016-10-31 NOTE — Telephone Encounter (Signed)
Ms. Robin Bass notified that the Cologuard would be filed under Dx codes: Z12.11 and Z12.12 which is encounter for screening for malignant neoplasm of colon.  She is going to call her insurance company back to make sure they will cover the test with those diagnosis code.  She will call us back to let us know what they say.

## 2016-11-16 ENCOUNTER — Other Ambulatory Visit: Payer: Self-pay | Admitting: *Deleted

## 2016-11-16 MED ORDER — FLUOXETINE HCL 20 MG PO CAPS: 20.0000 mg | ORAL_CAPSULE | Freq: Every day | ORAL | 1 refills | Status: DC

## 2016-12-01 ENCOUNTER — Telehealth: Payer: Self-pay | Admitting: Family Medicine

## 2016-12-01 DIAGNOSIS — Z1231 Encounter for screening mammogram for malignant neoplasm of breast: Secondary | ICD-10-CM

## 2016-12-01 NOTE — Telephone Encounter (Signed)
She does not need a referral. Can you give contact info, so she can make her appointment directly.  MAMMOGRAPHY IN [place]:  [place] [phone] [place], [place] [address]  [facility] ([facility]ly [facility]) [address] [address] [place], [place] [address] Phone: [phone] Toll Free: [phone]  MAMMOGRAPHY IN [place]:  [facility] or [place]) [phone] Located on the campus of [facility])  [facility] Location) [address]  [place], [place] [address]

## 2016-12-01 NOTE — Telephone Encounter (Signed)
Pt would like to change the location of where she goes for her mammogram (screening) and she was told she needs a referral.  She used to go to the breast center and would like to go to [place]   cb number is [phone]

## 2016-12-01 NOTE — Telephone Encounter (Signed)
Order placed for Routine Screening Mammogram.  Advised patient  to call [facility] tomorrow to schedule her appointment.

## 2016-12-26 ENCOUNTER — Other Ambulatory Visit: Payer: Self-pay | Admitting: Family Medicine

## 2016-12-26 ENCOUNTER — Other Ambulatory Visit: Payer: Self-pay | Admitting: *Deleted

## 2016-12-26 MED ORDER — AMLODIPINE BESYLATE 10 MG PO TABS: 10.0000 mg | ORAL_TABLET | Freq: Every day | ORAL | 1 refills | Status: DC

## 2016-12-28 ENCOUNTER — Ambulatory Visit
Admission: RE | Admit: 2016-12-28 | Discharge: 2016-12-28 | Disposition: A | Discharge status: Home / Self Care | Payer: BLUE CROSS/BLUE SHIELD | Source: Ambulatory Visit | Attending: Family Medicine | Admitting: Family Medicine

## 2016-12-28 DIAGNOSIS — Z1231 Encounter for screening mammogram for malignant neoplasm of breast: Secondary | ICD-10-CM | POA: Diagnosis present

## 2016-12-28 HISTORY — DX: Malignant (primary) neoplasm, unspecified: C80.1

## 2017-02-06 ENCOUNTER — Other Ambulatory Visit: Payer: Self-pay | Admitting: Family Medicine

## 2017-03-07 ENCOUNTER — Other Ambulatory Visit: Payer: Self-pay | Admitting: Family Medicine

## 2017-05-16 ENCOUNTER — Other Ambulatory Visit: Payer: Self-pay | Admitting: Family Medicine

## 2017-06-27 ENCOUNTER — Other Ambulatory Visit: Payer: Self-pay | Admitting: Family Medicine

## 2017-08-07 ENCOUNTER — Other Ambulatory Visit: Payer: Self-pay | Admitting: Family Medicine

## 2017-08-14 ENCOUNTER — Other Ambulatory Visit: Payer: Self-pay | Admitting: *Deleted

## 2017-08-14 MED ORDER — FLUOXETINE HCL 20 MG PO CAPS: 20.0000 mg | ORAL_CAPSULE | Freq: Every day | ORAL | 0 refills | Status: DC

## 2017-09-04 ENCOUNTER — Other Ambulatory Visit: Payer: Self-pay | Admitting: Family Medicine

## 2017-09-04 MED ORDER — ATORVASTATIN CALCIUM 20 MG PO TABS: 20.0000 mg | ORAL_TABLET | Freq: Every day | ORAL | 0 refills | Status: DC

## 2017-09-04 NOTE — Addendum Note (Signed)
Addended by: Carter Kitten on: 09/04/2017 12:24 PM   Modules accepted: Orders

## 2017-09-05 ENCOUNTER — Ambulatory Visit: Payer: BLUE CROSS/BLUE SHIELD | Admitting: Family Medicine

## 2017-09-05 ENCOUNTER — Encounter: Payer: Self-pay | Admitting: Family Medicine

## 2017-09-05 DIAGNOSIS — M25569 Pain in unspecified knee: Secondary | ICD-10-CM

## 2017-09-05 DIAGNOSIS — B359 Dermatophytosis, unspecified: Secondary | ICD-10-CM

## 2017-09-05 NOTE — Progress Notes (Signed)
L knee pain.    She got some kittens and about 2.5 weeks ago one was nearly under her foot.  She stepped awkwardly to try to avoid the kitten and strained her knee.  She felt pain at the time, but not a pop or tear.  Since then some days are better than others.  She can have intermittent pain, sometimes w/o any pain.  No pain if sitting down for awhile.  More pain walking, etc.  No locking or sticking.  Focally tender, medially.  No bruising.    2cm ringworm on the R wrist noted recently.  D/w pt about otc tx.    No apparent distress.  Able to bear weight.  2cm ringworm lesion noted on the right wrist.  No ulceration. Left knee with normal range of motion.  Minimal crepitus.  Patella not tender.  Medial and lateral joint line not tender.  Popliteal area not tender.  ACL and LCL both feel solid.  Her MCL feels solid and intact but she has slight pain on testing MCL.  No meniscal click.  No locking.

## 2017-09-05 NOTE — Patient Instructions (Addendum)
Get OTC athlete's foot cream and that should help the spot on your skin.  Likely soft tissue strain.  2 aleve in the AM, 1 in the PM, with food.  Ice 5 minutes on and 5 minutes off.  Don't put ice directly on the skin.  Update [place] if not better.  Take care.  Glad to see you.

## 2017-09-06 DIAGNOSIS — B359 Dermatophytosis, unspecified: Secondary | ICD-10-CM | POA: Insufficient documentation

## 2017-09-06 DIAGNOSIS — M25569 Pain in unspecified knee: Secondary | ICD-10-CM | POA: Insufficient documentation

## 2017-09-06 NOTE — Assessment & Plan Note (Signed)
Get OTC athlete's foot cream and that should resolve the lesion.

## 2017-09-06 NOTE — Assessment & Plan Note (Signed)
She could have aggravated underlying arthritis but she also could have a mild MCL strain.  Discussed with patient.  Imaging is likely not useful at this point.  She understood.   2 aleve in the AM, 1 in the PM, with food.  Ice 5 minutes on and 5 minutes off.  Don't put ice directly on the skin.  Update [place] if not better.  She agrees.

## 2017-09-11 ENCOUNTER — Ambulatory Visit: Payer: BLUE CROSS/BLUE SHIELD | Admitting: Family Medicine

## 2017-09-11 ENCOUNTER — Other Ambulatory Visit: Payer: Self-pay

## 2017-09-11 ENCOUNTER — Encounter: Payer: Self-pay | Admitting: Family Medicine

## 2017-09-11 VITALS — BP 122/64 | HR 76 | Temp 98.3°F | Ht 59.75 in | Wt 173.5 lb

## 2017-09-11 DIAGNOSIS — M25562 Pain in left knee: Secondary | ICD-10-CM | POA: Diagnosis not present

## 2017-09-11 DIAGNOSIS — Z0001 Encounter for general adult medical examination with abnormal findings: Secondary | ICD-10-CM | POA: Diagnosis not present

## 2017-09-11 DIAGNOSIS — Z0000 Encounter for general adult medical examination without abnormal findings: Secondary | ICD-10-CM

## 2017-09-11 DIAGNOSIS — E119 Type 2 diabetes mellitus without complications: Secondary | ICD-10-CM | POA: Diagnosis not present

## 2017-09-11 DIAGNOSIS — E038 Other specified hypothyroidism: Secondary | ICD-10-CM

## 2017-09-11 LAB — CBC WITH DIFFERENTIAL/PLATELET
BASOS PCT: 1.5 % (ref 0.0–3.0)
Basophils Absolute: 0.1 10*3/uL (ref 0.0–0.1)
EOS PCT: 2 % (ref 0.0–5.0)
Eosinophils Absolute: 0.1 10*3/uL (ref 0.0–0.7)
HCT: 39.5 % (ref 36.0–46.0)
Hemoglobin: 12.9 g/dL (ref 12.0–15.0)
LYMPHS ABS: 2 10*3/uL (ref 0.7–4.0)
Lymphocytes Relative: 29.2 % (ref 12.0–46.0)
MCHC: 32.6 g/dL (ref 30.0–36.0)
MCV: 77.1 fl — ABNORMAL LOW (ref 78.0–100.0)
MONO ABS: 0.3 10*3/uL (ref 0.1–1.0)
Monocytes Relative: 4.8 % (ref 3.0–12.0)
NEUTROS PCT: 62.5 % (ref 43.0–77.0)
Neutro Abs: 4.2 10*3/uL (ref 1.4–7.7)
PLATELETS: 357 10*3/uL (ref 150.0–400.0)
RBC: 5.13 Mil/uL — ABNORMAL HIGH (ref 3.87–5.11)
RDW: 16 % — AB (ref 11.5–15.5)
WBC: 6.7 10*3/uL (ref 4.0–10.5)

## 2017-09-11 LAB — HEPATIC FUNCTION PANEL
ALBUMIN: 4.3 g/dL (ref 3.5–5.2)
ALT: 11 U/L (ref 0–35)
AST: 14 U/L (ref 0–37)
Alkaline Phosphatase: 102 U/L (ref 39–117)
BILIRUBIN TOTAL: 0.5 mg/dL (ref 0.2–1.2)
Bilirubin, Direct: 0 mg/dL (ref 0.0–0.3)
Total Protein: 7.5 g/dL (ref 6.0–8.3)

## 2017-09-11 LAB — LIPID PANEL
CHOLESTEROL: 150 mg/dL (ref 0–200)
HDL: 53.4 mg/dL (ref >39.00)
LDL CALC: 79 mg/dL (ref 0–99)
NonHDL: 97.03
TRIGLYCERIDES: 88 mg/dL (ref 0.0–149.0)
Total CHOL/HDL Ratio: 3
VLDL: 17.6 mg/dL (ref 0.0–40.0)

## 2017-09-11 LAB — BASIC METABOLIC PANEL
BUN: 22 mg/dL (ref 6–23)
CHLORIDE: 100 meq/L (ref 96–112)
CO2: 31 mEq/L (ref 19–32)
Calcium: 10 mg/dL (ref 8.4–10.5)
Creatinine, Ser: 0.73 mg/dL (ref 0.40–1.20)
GFR: 85.12 mL/min (ref >60.00)
GLUCOSE: 121 mg/dL — AB (ref 70–99)
POTASSIUM: 3.8 meq/L (ref 3.5–5.1)
Sodium: 138 mEq/L (ref 135–145)

## 2017-09-11 LAB — TSH: TSH: 3.53 u[IU]/mL (ref 0.35–4.50)

## 2017-09-11 LAB — T4, FREE: Free T4: 1.15 ng/dL (ref 0.60–1.60)

## 2017-09-11 LAB — T3, FREE: T3 FREE: 3.6 pg/mL (ref 2.3–4.2)

## 2017-09-11 LAB — HEMOGLOBIN A1C: HEMOGLOBIN A1C: 6.7 % — AB (ref 4.6–6.5)

## 2017-09-11 MED ORDER — METHYLPREDNISOLONE ACETATE 40 MG/ML IJ SUSP
80.0000 mg | Freq: Once | INTRAMUSCULAR | Status: AC
  Administered 2017-09-11: 80 mg via INTRA_ARTICULAR

## 2017-09-11 NOTE — Progress Notes (Signed)
Dr. Frederico Hamman T. Cayden Granholm, MD, [facility] Primary [facility] and Sports Medicine [place], [address] Phone: [phone] Fax: [phone]  09/11/2017  Patient: Robin Bass, MRN: [id number], DOB: 12-Jan-1951, 66 y.o.  Primary Physician:  Owens Loffler, MD   Chief Complaint  Patient presents with  . Annual Exam   Subjective:   Robin Bass is a 66 y.o. pleasant patient who presents with the following:  Health Maintenance Summary Reviewed and updated, unless pt declines services.  Tobacco History Reviewed. 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  Recent death of her mother. Grief process depression.   Colon / GI: declines, maybe cologuard  Needs all labs.   L knee. Medial knee pain. Saw Dr. Keturah Barre.  BS is looking better.   Health Maintenance  Topic Date Due  . COLONOSCOPY  12/30/2002  . OPHTHALMOLOGY EXAM  08/08/2015  . FOOT EXAM  08/23/2015  . HEMOGLOBIN A1C  03/05/2017  . MAMMOGRAM  12/29/2018  . TETANUS/TDAP  04/01/2024  . INFLUENZA VACCINE  Completed  . Hepatitis C Screening  Completed  . HIV Screening  Completed    Immunization History  Administered Date(s) Administered  . Influenza Whole 07/31/2008, 07/12/2010  . Influenza, Seasonal, Injecte, Preservative Fre 07/08/2015, 07/26/2016, 08/02/2017  . Pneumococcal Conjugate-13 08/21/2014  . Pneumococcal Polysaccharide-23 09/07/2015  . Tdap 04/01/2014   Patient Active Problem List   Diagnosis Date Noted  . Diabetes mellitus type 2, controlled, without complications ([place]) 62/70/3500    Priority: High  . Hypothyroidism 07/31/2008    Priority: Medium  . Essential hypertension 07/31/2008    Priority: Medium  . Knee pain 09/06/2017  .  Ringworm 09/06/2017  . Hyperlipidemia LDL goal <70 09/07/2015  . Depression 03/31/2012  . Overactive bladder 02/05/2011  . 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 ALLERGIC RHINITIS 07/31/2008  . ASTHMA 07/31/2008  . COPD 07/31/2008  . GERD 07/31/2008   Past Medical History:  Diagnosis Date  . Allergy   . Asthma   . Cancer ([place])    skin  . COPD (chronic obstructive pulmonary disease) ([place])   . Diabetes mellitus type 2, controlled, without complications ([place]) 93/81/8299  . GERD (gastroesophageal reflux disease)   . History of pituitary tumor   . Hypothyroidism   . Overactive bladder   . Prolactin deficiency [facility])    Past Surgical History:  Procedure Laterality Date  . ABDOMINAL HYSTERECTOMY     Social History   Socioeconomic History  . Marital status: Married    Spouse name: Not on file  . Number of children: 1  . Years of education: Not on file  . Highest education level: Not on file  Social Needs  . Financial resource strain: Not on file  . Food insecurity - worry: Not on file  . Food insecurity - inability: Not on file  . Transportation needs - medical: Not on file  . Transportation needs - non-medical: Not on file  Occupational History  . Occupation: Producer, television/film/video: UNEMPLOYED  Tobacco Use  . Smoking status: Never Smoker  . Smokeless tobacco: Never Used  Substance and Sexual Activity  . Alcohol use: No  . Drug use: No  . Sexual activity: Not on file  Other Topics Concern  . Not on file  Social History Narrative  . Not on file   Family History  Problem Relation Age of Onset  . Breast cancer Neg Hx    Allergies  Allergen Reactions  . Sulfonamide Derivatives   . Ace Inhibitors Cough    Medication list has been reviewed and updated.   General: Denies fever, chills, sweats. No significant weight loss. Eyes: Denies blurring,significant itching ENT: Denies earache, sore throat, and hoarseness.  Cardiovascular: Denies chest pains, palpitations,  dyspnea on exertion,  Respiratory: Denies cough, dyspnea at rest,wheeezing Breast: no concerns about lumps GI: Denies nausea, vomiting, diarrhea, constipation, change in bowel habits, abdominal pain, melena, hematochezia GU: Denies dysuria, hematuria, urinary hesitancy, nocturia, denies STD risk, no concerns about discharge Musculoskeletal: Denies back pain, joint pain Derm: Denies rash, itching Neuro: Denies  paresthesias, frequent falls, frequent headaches Psych: Denies depression, anxiety Endocrine: Denies cold intolerance, heat intolerance, polydipsia Heme: Denies enlarged lymph nodes Allergy: No hayfever  Objective:   BP 122/64   Pulse 76   Temp 98.3 F (36.8 C) (Oral)   Ht 4' 11.75" (1.518 m)   Wt 173 lb 8 oz (78.7 kg)   BMI 34.17 kg/m  No exam data present  GEN: well developed, well nourished, no acute distress Eyes: conjunctiva and lids normal, PERRLA, EOMI ENT: TM clear, nares clear, oral exam WNL Neck: supple, no lymphadenopathy, no thyromegaly, no JVD Pulm: clear to auscultation and percussion, respiratory effort normal CV: regular rate and rhythm, S1-S2, no murmur, rub or gallop, no bruits Chest: no scars, masses, no lumps BREAST: breast exam declined GI: soft, non-tender; no hepatosplenomegaly, masses; active bowel sounds all quadrants GU: GU exam declined Lymph: no cervical, axillary or inguinal adenopathy MSK: gait normal, muscle tone and strength WNL, no joint swelling, effusions, discoloration, crepitus   Knee:  L Gait: Normal heel toe pattern ROM: 0-120 Effusion: neg Echymosis or edema: none Patellar tendon NT Painful PLICA: neg Patellar grind: negative Medial and lateral patellar facet loading: negative medial and lateral joint lines: medial joint line pain Mcmurray's + for pain Flexion-pinch neg Varus and valgus stress: stable Lachman: neg Ant and Post drawer: neg Hip abduction, IR, ER: WNL Hip flexion str: 5/5 Hip abd: 5/5 Quad: 5/5 VMO  atrophy:No Hamstring concentric and eccentric: 5/5   SKIN: clear, good turgor, color WNL, no rashes, lesions, or ulcerations Neuro: normal mental status, normal strength, sensation, and motion Psych: alert; oriented to person, place and time, normally interactive and not anxious or depressed in appearance.   All labs reviewed with patient. Lipids:    Component Value Date/Time   CHOL 135 09/05/2016 0812   TRIG 89.0 09/05/2016 0812   HDL 49.40 09/05/2016 0812   LDLDIRECT 109.6 05/13/2013 1636   VLDL 17.8 09/05/2016 0812   CHOLHDL 3 09/05/2016 0812   CBC: CBC Latest Ref Rng & Units 09/05/2016 08/31/2015 03/27/2014  WBC 4.0 - 10.5 K/uL 8.1 8.4 7.9  Hemoglobin 12.0 - 15.0 g/dL 12.8 13.1 13.1  Hematocrit 36.0 - 46.0 % 38.4 39.5 39.6  Platelets 150.0 - 400.0 K/uL 327.0 344.0 712.4    Basic Metabolic Panel:    Component Value Date/Time   NA 142 09/05/2016 0812   K 3.4 (L) 09/05/2016 0812   CL 103 09/05/2016 0812   CO2 30 09/05/2016 0812   BUN 17 09/05/2016 0812   CREATININE 0.65 09/05/2016 0812   GLUCOSE 130 (H) 09/05/2016 0812   CALCIUM 9.6 09/05/2016 0812   Hepatic Function Latest  Ref Rng & Units 09/05/2016 03/07/2016 08/31/2015  Total Protein 6.0 - 8.3 g/dL 7.3 7.7 7.4  Albumin 3.5 - 5.2 g/dL 4.1 4.3 4.0  AST 0 - 37 U/L _0 ALT 0 - 35 U/L _1 Alk Phosphatase 39 - 117 U/L 114 109 104  Total Bilirubin 0.2 - 1.2 mg/dL 0.4 0.3 0.5  Bilirubin, Direct 0.0 - 0.3 mg/dL 0.1 0.1 0.1    Lab Results  Component Value Date   TSH 3.85 09/05/2016   No results found.  Assessment and Plan:   Healthcare maintenance - Plan: CBC with Differential/Platelet, Hepatic function panel, Lipid panel  Controlled type 2 diabetes mellitus without complication, without long-term current use of insulin (HCC) - Plan: Basic metabolic panel, Hemoglobin A1c, Lipid panel  Other specified hypothyroidism - Plan: T4, free, T3, free, TSH  Left knee pain, unspecified chronicity - Plan:  methylPREDNISolone acetate (DEPO-MEDROL) injection 80 mg  Knee Injection, L Patient verbally consented to procedure. Risks (including potential rare risk of infection), benefits, and alternatives explained. Sterilely prepped with Chloraprep. Ethyl cholride used for anesthesia. 8 cc Lidocaine 1% mixed with 2 mL Depo-Medrol 40 mg injected using the anteromedial approach without difficulty. No complications with procedure and tolerated well. Patient had decreased pain post-injection.   Health Maintenance Exam: The patient's preventative maintenance and recommended screening tests for an annual wellness exam were reviewed in full today. Brought up to date unless services declined.  Counselled on the importance of diet, exercise, and its role in overall health and mortality. The patient's FH and SH was reviewed, including their home life, tobacco status, and drug and alcohol status.  Follow-up in 1 year for physical exam or additional follow-up below.  Follow-up: No Follow-up on file. Or follow-up in 1 year if not noted.  Meds ordered this encounter  Medications  . methylPREDNISolone acetate (DEPO-MEDROL) injection 80 mg   Orders Placed This Encounter  Procedures  . Basic metabolic panel  . CBC with Differential/Platelet  . Hemoglobin A1c  . Hepatic function panel  . Lipid panel  . T4, free  . T3, free  . TSH    Signed,  Frederico Hamman T. Copland, MD   Allergies as of 09/11/2017      Reactions   Sulfonamide Derivatives    Ace Inhibitors Cough      Medication List        Accurate as of 09/11/17  3:23 PM. Always use your most recent med list.          amLODipine 10 MG tablet Commonly known as:  NORVASC TAKE 1 TABLET BY MOUTH ONCE A DAY   atorvastatin 20 MG tablet Commonly known as:  LIPITOR Take 1 tablet (20 mg total) daily by mouth.   FLUoxetine 20 MG capsule Commonly known as:  PROZAC Take 1 capsule (20 mg total) by mouth daily.   levothyroxine 50 MCG  tablet Commonly known as:  SYNTHROID, LEVOTHROID TAKE 1 TABLET BY MOUTH ONCE DAILY   losartan-hydrochlorothiazide 100-25 MG tablet Commonly known as:  HYZAAR TAKE 1 TABLET BY MOUTH ONCE DAILY   metFORMIN 500 MG 24 hr tablet Commonly known as:  GLUCOPHAGE-XR TAKE 1 TABLET BY MOUTH DAILY WITH BREAKFAST   ONE TOUCH ULTRA TEST test strip Generic drug:  glucose blood USE TO CHECK BLOOD SUGAR TWICE A DAY AND AS DIRECTED   ONETOUCH DELICA LANCETS 35W Misc Use to check blood sugar two times a day

## 2017-09-20 ENCOUNTER — Encounter: Payer: Self-pay | Admitting: *Deleted

## 2017-09-21 ENCOUNTER — Other Ambulatory Visit: Payer: Self-pay | Admitting: Family Medicine

## 2017-09-25 ENCOUNTER — Other Ambulatory Visit: Payer: Self-pay | Admitting: Family Medicine

## 2017-10-05 ENCOUNTER — Encounter: Payer: Self-pay | Admitting: Family Medicine

## 2017-10-05 ENCOUNTER — Ambulatory Visit: Payer: BLUE CROSS/BLUE SHIELD | Admitting: Family Medicine

## 2017-10-05 VITALS — BP 134/66 | HR 58 | Temp 98.5°F | Ht 59.75 in | Wt 173.2 lb

## 2017-10-05 DIAGNOSIS — B9789 Other viral agents as the cause of diseases classified elsewhere: Secondary | ICD-10-CM | POA: Diagnosis not present

## 2017-10-05 DIAGNOSIS — J069 Acute upper respiratory infection, unspecified: Secondary | ICD-10-CM

## 2017-10-05 MED ORDER — HYDROCODONE-HOMATROPINE 5-1.5 MG/5ML PO SYRP: 5.0000 mL | ORAL_SOLUTION | Freq: Three times a day (TID) | ORAL | 0 refills | Status: DC | PRN

## 2017-10-05 MED ORDER — BENZONATATE 200 MG PO CAPS: 200.0000 mg | ORAL_CAPSULE | Freq: Three times a day (TID) | ORAL | 1 refills | Status: DC | PRN

## 2017-10-05 NOTE — Assessment & Plan Note (Signed)
Since Monday  Persistent cough/ reassuring exam  Symptom tx :  Tessalon  Hycodan prn with caution  Expectorant/DM otc prn  Rest/fluids/nasal saline Disc symptomatic care - see instructions on AVS  Update if not starting to improve in a week or if worsening

## 2017-10-05 NOTE — Progress Notes (Signed)
Subjective:    Patient ID: Robin Bass, female    DOB: [birth year], 66 y.o.   MRN: [id number]  HPI  66 yo female diabetic pt of [name] is here with cough and congestion   Symptoms started on Sunday am   Coughing - so hard it makes her ribs hurt  Prod -clear to a little green  No wheezing or sob  Very congested - head more than chest  No fever (but felt chilled at times) Temp: 98.5 F (36.9 C)    Headache/throbbing  No sinus pain  Throat - very sore sun/mon-improved now  pnd and snoring at night/ mouth breathing  R ear feels very clogged up   otc- delym cough medicine  Alka selzer gel cap for congestion and cough (multi system)    Tessalon generally does not work on its own for cough   Patient Active Problem List   Diagnosis Date Noted  . Viral URI with cough 10/05/2017  . Knee pain 09/06/2017  . Ringworm 09/06/2017  . Hyperlipidemia LDL goal <70 09/07/2015  . Diabetes mellitus type 2, controlled, without complications ([place]) 40/98/1191  . Depression 03/31/2012  . Overactive bladder 02/05/2011  . 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 Hypothyroidism 07/31/2008  . Essential hypertension 07/31/2008  . ALLERGIC RHINITIS 07/31/2008  . ASTHMA 07/31/2008  . COPD 07/31/2008  . GERD 07/31/2008   Past Medical History:  Diagnosis Date  . Allergy   . Asthma   . Cancer ([place])    skin  . COPD (chronic obstructive pulmonary disease) ([place])   . Diabetes mellitus type 2, controlled, without complications ([place]) 47/82/9562  . GERD (gastroesophageal reflux disease)   . History of pituitary tumor   . Hypothyroidism   . Overactive bladder   . Prolactin deficiency [facility])    Past Surgical History:  Procedure Laterality Date  . ABDOMINAL HYSTERECTOMY     Social History   Tobacco Use  . Smoking status: Never Smoker  . Smokeless tobacco: Never Used  Substance Use Topics  . Alcohol use: No  . Drug use: No   Family History  Problem Relation Age of Onset  . Breast cancer  Neg Hx    Allergies  Allergen Reactions  . Sulfonamide Derivatives   . Ace Inhibitors Cough   Current Outpatient Medications on File Prior to Visit  Medication Sig Dispense Refill  . amLODipine (NORVASC) 10 MG tablet TAKE 1 TABLET BY MOUTH ONCE A DAY 90 tablet 1  . atorvastatin (LIPITOR) 20 MG tablet Take 1 tablet (20 mg total) daily by mouth. 90 tablet 0  . FLUoxetine (PROZAC) 20 MG capsule Take 1 capsule (20 mg total) by mouth daily. 90 capsule 0  . levothyroxine (SYNTHROID, LEVOTHROID) 50 MCG tablet TAKE 1 TABLET BY MOUTH ONCE A DAY 90 tablet 3  . losartan-hydrochlorothiazide (HYZAAR) 100-25 MG tablet TAKE 1 TABLET BY MOUTH ONCE DAILY 90 tablet 0  . metFORMIN (GLUCOPHAGE-XR) 500 MG 24 hr tablet TAKE 1 TABLET BY MOUTH DAILY WITH BREAKFAST 90 tablet 0  . ONE TOUCH ULTRA TEST test strip USE TO CHECK BLOOD SUGAR TWICE A DAY AND AS DIRECTED 100 each 6  . ONETOUCH DELICA LANCETS 13Y MISC Use to check blood sugar two times a day 100 each 11   No current facility-administered medications on file prior to visit.      Review of Systems  Constitutional: Positive for appetite change and fatigue. Negative for fever.  HENT: Positive for congestion, postnasal drip, rhinorrhea, sinus pressure, sneezing  and sore throat. Negative for ear pain.   Eyes: Negative for pain and discharge.  Respiratory: Positive for cough. Negative for shortness of breath, wheezing and stridor.   Cardiovascular: Negative for chest pain.  Gastrointestinal: Negative for diarrhea, nausea and vomiting.  Genitourinary: Negative for frequency, hematuria and urgency.  Musculoskeletal: Negative for arthralgias and myalgias.  Skin: Negative for rash.  Neurological: Positive for headaches. Negative for dizziness, weakness and light-headedness.  Psychiatric/Behavioral: Negative for confusion and dysphoric mood.       Objective:   Physical Exam  Constitutional: She appears well-developed and well-nourished. No distress.  HENT:   Head: Normocephalic and atraumatic.  Right Ear: External ear normal.  Left Ear: External ear normal.  Mouth/Throat: Oropharynx is clear and moist.  Nares are injected and congested  No sinus tenderness Clear rhinorrhea and post nasal drip   Eyes: Conjunctivae and EOM are normal. Pupils are equal, round, and reactive to light. Right eye exhibits no discharge. Left eye exhibits no discharge.  Neck: Normal range of motion. Neck supple.  Cardiovascular: Normal rate and normal heart sounds.  Pulmonary/Chest: Effort normal and breath sounds normal. No respiratory distress. She has no wheezes. She has no rales. She exhibits no tenderness.  Good air exch  Hacking cough  No rales/rhonchi or harsh bs today Nl exp phase  Lymphadenopathy:    She has no cervical adenopathy.  Neurological: She is alert.  Skin: Skin is warm and dry. No rash noted.  Psychiatric: She has a normal mood and affect.          Assessment & Plan:   Problem List Items Addressed This Visit      Respiratory   Viral URI with cough    Since Monday  Persistent cough/ reassuring exam  Symptom tx :  Tessalon  Hycodan prn with caution  Expectorant/DM otc prn  Rest/fluids/nasal saline Disc symptomatic care - see instructions on AVS  Update if not starting to improve in a week or if worsening

## 2017-10-05 NOTE — Patient Instructions (Signed)
You have a viral upper respiratory infection  Drink lots of fluids Breathe steam  Use nasal saline spray for congestion as well  For cough: Tessalon three times daily  Hycodan with caution at night (when not working or driving)  mucinex or robitussin dm over the counter   Alert [place] if fever over 100.5 or if much worsened cough or other symptoms   Update if not starting to improve in a week or if worsening

## 2017-10-19 ENCOUNTER — Telehealth: Payer: Self-pay | Admitting: Family Medicine

## 2017-10-19 NOTE — Telephone Encounter (Signed)
She needs to be seen, sorry to hear that  Please put her in with first available  I will cc PCP as well  Thanks

## 2017-10-19 NOTE — Telephone Encounter (Signed)
appt scheduled with Dr. Lorelei Pont tomorrow

## 2017-10-19 NOTE — Telephone Encounter (Signed)
I spoke with pt and the cough is worsening again; non prod cough now but coughing so hard pt vomits after coughing episode. Pt does not think she has fever. Pt feels tired and ribs hurting from so much coughing.pt has been taking tessalon perle and hycodan at night. Wheezing the same as when seen 10/05/17. Today FBS was 140; BS are higher than usual; running in 140's and 150's. Pt wants to know if there is a med that could be sent to [facility].

## 2017-10-19 NOTE — Telephone Encounter (Signed)
Copied from [place] [id number]. Topic: Quick Communication - See Telephone Encounter >> Oct 19, 2017  1:08 PM Ether Griffins B wrote: CRM for notification. See Telephone encounter for:  Pt saw Dr. Glori Bickers a couple weeks ago. Dr. Glori Bickers told pt she should start to feel better with in two weeks. Pt started to feel better for a couple of days and now the symptoms are coming back and pt is wanting to know if she needs to be re seen or if something can be called in.  10/19/17.

## 2017-10-20 ENCOUNTER — Other Ambulatory Visit: Payer: Self-pay

## 2017-10-20 ENCOUNTER — Ambulatory Visit: Payer: BLUE CROSS/BLUE SHIELD | Admitting: Family Medicine

## 2017-10-20 ENCOUNTER — Encounter: Payer: Self-pay | Admitting: Family Medicine

## 2017-10-20 VITALS — BP 130/70 | HR 79 | Temp 98.2°F | Ht 59.75 in | Wt 173.8 lb

## 2017-10-20 DIAGNOSIS — E119 Type 2 diabetes mellitus without complications: Secondary | ICD-10-CM | POA: Diagnosis not present

## 2017-10-20 DIAGNOSIS — J189 Pneumonia, unspecified organism: Secondary | ICD-10-CM | POA: Diagnosis not present

## 2017-10-20 MED ORDER — DOXYCYCLINE HYCLATE 100 MG PO TABS: 100.0000 mg | ORAL_TABLET | Freq: Two times a day (BID) | ORAL | 0 refills | Status: AC

## 2017-10-20 NOTE — Progress Notes (Signed)
Dr. Frederico Hamman T. Reinaldo Helt, MD, [facility] [place], [address] Phone: [phone] Fax: [phone]  10/20/2017  Patient: Robin Bass, MRN: [id number], DOB: 08/31/1951, 66 y.o.  Primary Physician:  Owens Loffler, MD   Chief Complaint  Patient presents with  . Follow-up    URI-seen by Dr. Glori Bickers on 10/05/2017-not better   Subjective:   Robin Bass is a 66 y.o. very pleasant female patient who presents with the following:  Pleasant lady with diabetes who is known well.  She was seen by my partner little bit over 2 weeks ago, and at that point she was diagnosed with a URI and recommended supportive care.  She is continued to do poorly, and she has had a difficult time leaving the house, leaving only twice in the last 2 weeks.  She feels generally quite poorly overall and she is having a very deep, productive cough that is been persistent and worsening.  Initially she got a little bit better, but then she has worsened particularly in the last 5-7 days.  She was also somewhat concerned about her blood sugars being more elevated compared to normal.  Her most recent A1c which was 1 month ago was 6.7.  BS 150 - 180 recently.   Has been out of the house a couple of times, feels pretty miserable.   Past Medical History, Surgical History, Social History, Family History, Problem List, Medications, and Allergies have been reviewed and updated if relevant.  Patient Active Problem List   Diagnosis Date Noted  . Diabetes mellitus type 2, controlled, without complications ([place]) 26/94/8546    Priority: High  . Hypothyroidism 07/31/2008    Priority: Medium  . Essential hypertension 07/31/2008    Priority: Medium  . Viral URI with cough 10/05/2017  . Knee pain 09/06/2017  . Ringworm 09/06/2017  . Hyperlipidemia LDL goal <70 09/07/2015  . Depression 03/31/2012  . Overactive bladder 02/05/2011  . 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 ALLERGIC RHINITIS 07/31/2008  . ASTHMA 07/31/2008  . COPD 07/31/2008  . GERD 07/31/2008    Past Medical History:  Diagnosis Date  . Allergy   . Asthma   . Cancer ([place])    skin  . COPD (chronic obstructive pulmonary disease) ([place])   . Diabetes mellitus type 2, controlled, without complications ([place]) 27/12/5007  . GERD (gastroesophageal reflux disease)   . History of pituitary tumor   . Hypothyroidism   . Overactive bladder   . Prolactin deficiency [facility])     Past Surgical History:  Procedure Laterality Date  . ABDOMINAL HYSTERECTOMY      Social History   Socioeconomic History  . Marital status: Married    Spouse name: Not on file  . Number of children: 1  . Years of education: Not on file  . Highest education level: Not on file  Social Needs  . Financial resource strain: Not on file  . Food insecurity - worry: Not on file  . Food insecurity - inability: Not on file  . Transportation needs - medical: Not on file  . Transportation needs - non-medical: Not on file  Occupational History  . Occupation: Producer, television/film/video: UNEMPLOYED  Tobacco Use  . Smoking status: Never Smoker  . Smokeless tobacco: Never Used  Substance and Sexual Activity  . Alcohol use: No  . Drug use: No  . Sexual activity: Not on file  Other Topics Concern  . Not  on file  Social History Narrative  . Not on file    Family History  Problem Relation Age of Onset  . Breast cancer Neg Hx     Allergies  Allergen Reactions  . Sulfonamide Derivatives   . Ace Inhibitors Cough    Medication list reviewed and updated in full in [place].  ROS: GEN: Acute illness details above GI: Tolerating PO intake GU: maintaining adequate hydration and urination Pulm: No SOB Interactive and getting along well at home.  Otherwise, ROS is as per the HPI.  Objective:   BP 130/70   Pulse 79   Temp 98.2 F (36.8 C) (Oral)   Ht 4' 11.75" (1.518 m)   Wt 173 lb 12 oz (78.8 kg)   SpO2  95%   BMI 34.22 kg/m    GEN: A and O x 3. WDWN. NAD.    ENT: Nose clear, ext NML.  No LAD.  No JVD.  TM's clear. Oropharynx clear.  PULM: Normal WOB, no distress. No crackles, + deep rhonchi B lung bases with rare wheeze CV: RRR, no M/G/R, No rubs, No JVD.   EXT: warm and well-perfused, No c/c/e. PSYCH: Pleasant and conversant.    Laboratory and Imaging Data:  Assessment and Plan:   Walking pneumonia  Controlled type 2 diabetes mellitus without complication, without long-term current use of insulin ([place])  Her lung exam is decidedly different compared to baseline.  I think that we need to treat this as a community-acquired pneumonia, more likely atypical, and will treat with doxycycline.  Blood sugar variation is not uncommon with acute illness, and likely is a cortisol effect.  Follow-up: No Follow-up on file.  Meds ordered this encounter  Medications  . doxycycline (VIBRA-TABS) 100 MG tablet    Sig: Take 1 tablet (100 mg total) by mouth 2 (two) times daily for 10 days.    Dispense:  20 tablet    Refill:  0   Signed,  Azalie Harbeck T. Mykell Rawl, MD   Allergies as of 10/20/2017      Reactions   Sulfonamide Derivatives    Ace Inhibitors Cough      Medication List        Accurate as of 10/20/17 11:59 PM. Always use your most recent med list.          amLODipine 10 MG tablet Commonly known as:  NORVASC TAKE 1 TABLET BY MOUTH ONCE A DAY   atorvastatin 20 MG tablet Commonly known as:  LIPITOR Take 1 tablet (20 mg total) daily by mouth.   benzonatate 200 MG capsule Commonly known as:  TESSALON Take 1 capsule (200 mg total) by mouth 3 (three) times daily as needed. Swallow whole, do not bite the pill   doxycycline 100 MG tablet Commonly known as:  VIBRA-TABS Take 1 tablet (100 mg total) by mouth 2 (two) times daily for 10 days.   FLUoxetine 20 MG capsule Commonly known as:  PROZAC Take 1 capsule (20 mg total) by mouth daily.   HYDROcodone-homatropine 5-1.5  MG/5ML syrup Commonly known as:  HYCODAN Take 5 mLs by mouth every 8 (eight) hours as needed for cough.   levothyroxine 50 MCG tablet Commonly known as:  SYNTHROID, LEVOTHROID TAKE 1 TABLET BY MOUTH ONCE A DAY   losartan-hydrochlorothiazide 100-25 MG tablet Commonly known as:  HYZAAR TAKE 1 TABLET BY MOUTH ONCE DAILY   metFORMIN 500 MG 24 hr tablet Commonly known as:  GLUCOPHAGE-XR TAKE 1 TABLET BY MOUTH DAILY WITH BREAKFAST  ONE TOUCH ULTRA TEST test strip Generic drug:  glucose blood USE TO CHECK BLOOD SUGAR TWICE A DAY AND AS DIRECTED   ONETOUCH DELICA LANCETS 14Y Misc Use to check blood sugar two times a day

## 2017-10-31 ENCOUNTER — Other Ambulatory Visit: Payer: Self-pay | Admitting: Family Medicine

## 2017-10-31 DIAGNOSIS — Z1231 Encounter for screening mammogram for malignant neoplasm of breast: Secondary | ICD-10-CM

## 2017-11-06 ENCOUNTER — Other Ambulatory Visit: Payer: Self-pay | Admitting: Family Medicine

## 2017-11-08 ENCOUNTER — Other Ambulatory Visit: Payer: Self-pay | Admitting: *Deleted

## 2017-11-08 MED ORDER — FLUOXETINE HCL 20 MG PO CAPS: 20.0000 mg | ORAL_CAPSULE | Freq: Every day | ORAL | 1 refills | Status: DC

## 2017-12-04 ENCOUNTER — Other Ambulatory Visit: Payer: Self-pay | Admitting: Family Medicine

## 2018-01-02 ENCOUNTER — Ambulatory Visit
Admission: RE | Admit: 2018-01-02 | Discharge: 2018-01-02 | Disposition: A | Discharge status: Home / Self Care | Payer: BLUE CROSS/BLUE SHIELD | Source: Ambulatory Visit | Attending: Family Medicine | Admitting: Family Medicine

## 2018-01-02 DIAGNOSIS — Z1231 Encounter for screening mammogram for malignant neoplasm of breast: Secondary | ICD-10-CM | POA: Insufficient documentation

## 2018-01-03 ENCOUNTER — Other Ambulatory Visit: Payer: Self-pay | Admitting: Family Medicine

## 2018-01-03 DIAGNOSIS — N6310 Unspecified lump in the right breast, unspecified quadrant: Secondary | ICD-10-CM

## 2018-01-03 DIAGNOSIS — R928 Other abnormal and inconclusive findings on diagnostic imaging of breast: Secondary | ICD-10-CM

## 2018-01-11 ENCOUNTER — Ambulatory Visit
Admission: RE | Admit: 2018-01-11 | Discharge: 2018-01-11 | Disposition: A | Discharge status: Home / Self Care | Payer: BLUE CROSS/BLUE SHIELD | Source: Ambulatory Visit | Attending: Family Medicine | Admitting: Family Medicine

## 2018-01-11 DIAGNOSIS — N6310 Unspecified lump in the right breast, unspecified quadrant: Secondary | ICD-10-CM

## 2018-01-11 DIAGNOSIS — R928 Other abnormal and inconclusive findings on diagnostic imaging of breast: Secondary | ICD-10-CM

## 2018-01-15 ENCOUNTER — Ambulatory Visit (INDEPENDENT_AMBULATORY_CARE_PROVIDER_SITE_OTHER)
Admission: RE | Admit: 2018-01-15 | Discharge: 2018-01-15 | Disposition: A | Discharge status: Home / Self Care | Payer: BLUE CROSS/BLUE SHIELD | Source: Ambulatory Visit | Attending: Family Medicine | Admitting: Family Medicine

## 2018-01-15 ENCOUNTER — Other Ambulatory Visit: Payer: Self-pay

## 2018-01-15 ENCOUNTER — Ambulatory Visit: Payer: BLUE CROSS/BLUE SHIELD | Admitting: Family Medicine

## 2018-01-15 ENCOUNTER — Encounter: Payer: Self-pay | Admitting: Family Medicine

## 2018-01-15 VITALS — BP 120/60 | HR 85 | Temp 98.7°F | Ht 59.75 in | Wt 172.0 lb

## 2018-01-15 DIAGNOSIS — L609 Nail disorder, unspecified: Secondary | ICD-10-CM | POA: Diagnosis not present

## 2018-01-15 DIAGNOSIS — M1712 Unilateral primary osteoarthritis, left knee: Secondary | ICD-10-CM | POA: Diagnosis not present

## 2018-01-15 DIAGNOSIS — M25562 Pain in left knee: Secondary | ICD-10-CM

## 2018-01-15 MED ORDER — METHYLPREDNISOLONE ACETATE 40 MG/ML IJ SUSP
40.0000 mg | Freq: Once | INTRAMUSCULAR | Status: AC
  Administered 2018-01-15: 40 mg via INTRAMUSCULAR

## 2018-01-15 NOTE — Progress Notes (Signed)
Dr. Frederico Hamman T. Travaris Kosh, MD, [facility] Medicine [place], [address] Phone: [phone] Fax: [phone]  01/15/2018  Patient: Robin Bass, MRN: [id number], DOB: 1951/06/02, 67 y.o.  Primary Physician:  Owens Loffler, MD   Chief Complaint  Patient presents with  . Knee Pain    Left  . Nail Problem   Subjective:   Robin Bass is a 67 y.o. very pleasant female patient who presents with the following:  Nail problem. subungal hematoma  Left knee. Good and bad days.  I saw her about this 5 or 6 months ago, and inferior to that she saw my partner.  She is been doing some NSAIDs and some topical treatments, and I did do an injection about 5 or 6 months ago with some corticosteroid.  This provided her some short-term relief.  She is not having any locking up and not had any functional giving way.  She does think that it is been globally getting better. Biofreeze.   L knee inj  Past Medical History, Surgical History, Social History, Family History, Problem List, Medications, and Allergies have been reviewed and updated if relevant.  Patient Active Problem List   Diagnosis Date Noted  . Diabetes mellitus type 2, controlled, without complications ([place]) 89/37/3428    Priority: High  . Hypothyroidism 07/31/2008    Priority: Medium  . Essential hypertension 07/31/2008    Priority: Medium  . Viral URI with cough 10/05/2017  . Knee pain 09/06/2017  . Ringworm 09/06/2017  . Hyperlipidemia LDL goal <70 09/07/2015  . Depression 03/31/2012  . Overactive bladder 02/05/2011  . 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 ALLERGIC RHINITIS 07/31/2008  . ASTHMA 07/31/2008  . COPD 07/31/2008  . GERD 07/31/2008    Past Medical History:  Diagnosis Date  . Allergy   . Asthma   . Cancer ([place])    skin  . COPD (chronic obstructive pulmonary disease) ([place])   . Diabetes mellitus type 2, controlled, without complications ([place]) 76/81/1572  .  GERD (gastroesophageal reflux disease)   . History of pituitary tumor   . Hypothyroidism   . Overactive bladder   . Prolactin deficiency [facility])     Past Surgical History:  Procedure Laterality Date  . ABDOMINAL HYSTERECTOMY      Social History   Socioeconomic History  . Marital status: Married    Spouse name: Not on file  . Number of children: 1  . Years of education: Not on file  . Highest education level: Not on file  Occupational History  . Occupation: Producer, television/film/video: UNEMPLOYED  Social Needs  . Financial resource strain: Not on file  . Food insecurity:    Worry: Not on file    Inability: Not on file  . Transportation needs:    Medical: Not on file    Non-medical: Not on file  Tobacco Use  . Smoking status: Never Smoker  . Smokeless tobacco: Never Used  Substance and Sexual Activity  . Alcohol use: No  . Drug use: No  . Sexual activity: Not on file  Lifestyle  . Physical activity:    Days per week: Not on file    Minutes per session: Not on file  . Stress: Not on file  Relationships  . Social connections:    Talks on phone: Not on file    Gets together: Not on file    Attends religious service: Not on file  Active member of club or organization: Not on file    Attends meetings of clubs or organizations: Not on file    Relationship status: Not on file  . Intimate partner violence:    Fear of current or ex partner: Not on file    Emotionally abused: Not on file    Physically abused: Not on file    Forced sexual activity: Not on file  Other Topics Concern  . Not on file  Social History Narrative  . Not on file    Family History  Problem Relation Age of Onset  . Breast cancer Neg Hx     Allergies  Allergen Reactions  . Sulfonamide Derivatives   . Ace Inhibitors Cough    Medication list reviewed and updated in full in [place].  GEN: No fevers, chills. Nontoxic. Primarily MSK c/o today. MSK: Detailed in the HPI GI: tolerating  PO intake without difficulty Neuro: No numbness, parasthesias, or tingling associated. Otherwise the pertinent positives of the ROS are noted above.   Objective:   BP 120/60   Pulse 85   Temp 98.7 F (37.1 C) (Oral)   Ht 4' 11.75" (1.518 m)   Wt 172 lb (78 kg)   BMI 33.87 kg/m    GEN: WDWN, NAD, Non-toxic, Alert & Oriented x 3 HEENT: Atraumatic, Normocephalic.  Ears and Nose: No external deformity. EXTR: No clubbing/cyanosis/edema NEURO: Normal gait.  PSYCH: Normally interactive. Conversant. Not depressed or anxious appearing.  Calm demeanor.   Knee:  L Gait: Normal heel toe pattern ROM: 0-120 Effusion: neg Echymosis or edema: none Patellar tendon NT Painful PLICA: neg Patellar grind: negative Medial and lateral patellar facet loading: negative medial and lateral joint lines: medial joint line pain Mcmurray's pain Flexion-pinch neg Varus and valgus stress: stable Lachman: neg Ant and Post drawer: neg Hip abduction, IR, ER: WNL Hip flexion str: 5/5 Hip abd: 5/5 Quad: 5/5 VMO atrophy:No Hamstring concentric and eccentric: 5/5   Radiology: Dg Knee 4 Views W/patella Left  Result Date: 01/15/2018 CLINICAL DATA:  Medial knee pain since twisting injury in October 2018. EXAM: LEFT KNEE - COMPLETE 4+ VIEW COMPARISON:  None. FINDINGS: No acute fracture or dislocation. Trace suprapatellar joint effusion. Mild medial and lateral compartment joint space narrowing with tiny marginal osteophytes. Bone mineralization is normal. Quadriceps and patellar enthesopathy. Soft tissues are unremarkable. Single frontal view of the right knee demonstrates tiny medial and lateral compartment marginal osteophytes with relative preservation of the joint spaces. IMPRESSION: 1. No acute osseous abnormality. Trace suprapatellar joint effusion. 2. Mild medial and lateral compartment osteoarthritis. Electronically Signed   By: Titus Dubin M.D.   On: 01/15/2018 16:25   Assessment and Plan:    Osteoarthrosis, localized, primary, knee, left  Left knee pain, unspecified chronicity - Plan: DG Knee 4 Views W/Patella Left, methylPREDNISolone acetate (DEPO-MEDROL) injection 40 mg  Nail abnormality  subungal hematoma, > 1 week, reassurance  Left-sided knee pain in a setting where I would describe more moderate medial compartmental osteoarthritis on plain film.  She is having some persistent symptoms, but she is improving.  She did sustain an injury in the fall.  Degenerative meniscal injury would certainly be high on the differential as well.  At age 73 with arthritis additionally, I think that continued conservative care would be reasonable.  Another trial of some corticosteroid would be reasonable.  If this fails, Vis[place] supplementation would also be reasonable.  Knee Injection, L Patient verbally consented to procedure. Risks (including potential  rare risk of infection), benefits, and alternatives explained. Sterilely prepped with Chloraprep. Ethyl cholride used for anesthesia. 8 cc Lidocaine 1% mixed with 2 mL Depo-Medrol 40 mg injected using the anteromedial approach without difficulty. No complications with procedure and tolerated well. Patient had decreased pain post-injection.   Follow-up: No follow-ups on file.  Meds ordered this encounter  Medications  . methylPREDNISolone acetate (DEPO-MEDROL) injection 40 mg   There are no discontinued medications. Orders Placed This Encounter  Procedures  . DG Knee 4 Views W/Patella Left    Signed,  Laree Garron T. Anniyah Mood, MD   Allergies as of 01/15/2018      Reactions   Sulfonamide Derivatives    Ace Inhibitors Cough      Medication List        Accurate as of 01/15/18 11:59 PM. Always use your most recent med list.          amLODipine 10 MG tablet Commonly known as:  NORVASC TAKE 1 TABLET BY MOUTH ONCE A DAY   atorvastatin 20 MG tablet Commonly known as:  LIPITOR TAKE 1 TABLET BY MOUTH ONCE DAILY   benzonatate 200 MG  capsule Commonly known as:  TESSALON Take 1 capsule (200 mg total) by mouth 3 (three) times daily as needed. Swallow whole, do not bite the pill   FLUoxetine 20 MG capsule Commonly known as:  PROZAC Take 1 capsule (20 mg total) by mouth daily.   HYDROcodone-homatropine 5-1.5 MG/5ML syrup Commonly known as:  HYCODAN Take 5 mLs by mouth every 8 (eight) hours as needed for cough.   levothyroxine 50 MCG tablet Commonly known as:  SYNTHROID, LEVOTHROID TAKE 1 TABLET BY MOUTH ONCE A DAY   losartan-hydrochlorothiazide 100-25 MG tablet Commonly known as:  HYZAAR TAKE 1 TABLET BY MOUTH ONCE A DAY   metFORMIN 500 MG 24 hr tablet Commonly known as:  GLUCOPHAGE-XR TAKE 1 TABLET BY MOUTH ONCE A DAY WITH BREAKFAST   ONE TOUCH ULTRA TEST test strip Generic drug:  glucose blood USE TO CHECK BLOOD SUGAR TWICE A DAY AND AS DIRECTED   ONETOUCH DELICA LANCETS 97C Misc Use to check blood sugar two times a day

## 2018-03-20 ENCOUNTER — Other Ambulatory Visit: Payer: Self-pay | Admitting: Family Medicine

## 2018-05-07 ENCOUNTER — Other Ambulatory Visit: Payer: Self-pay | Admitting: Family Medicine

## 2018-05-07 NOTE — Telephone Encounter (Signed)
Electronic refill request Last office visit 01/15/18 Last refill 11/06/17 #90/1

## 2018-08-06 ENCOUNTER — Other Ambulatory Visit: Payer: Self-pay | Admitting: Family Medicine

## 2018-08-27 ENCOUNTER — Other Ambulatory Visit: Payer: Self-pay | Admitting: Family Medicine

## 2018-09-06 ENCOUNTER — Telehealth: Payer: Self-pay | Admitting: Family Medicine

## 2018-09-06 NOTE — Telephone Encounter (Signed)
Left message asking pt to call office Please r/s 11/27 appointment with dr copland.  Also r/s her labs 11/22.    Look in Jan

## 2018-09-10 ENCOUNTER — Other Ambulatory Visit: Payer: Self-pay | Admitting: Family Medicine

## 2018-09-14 ENCOUNTER — Telehealth: Payer: Self-pay | Admitting: Family Medicine

## 2018-09-14 ENCOUNTER — Other Ambulatory Visit: Payer: BLUE CROSS/BLUE SHIELD

## 2018-09-14 DIAGNOSIS — E785 Hyperlipidemia, unspecified: Secondary | ICD-10-CM

## 2018-09-14 DIAGNOSIS — E039 Hypothyroidism, unspecified: Secondary | ICD-10-CM

## 2018-09-14 NOTE — Telephone Encounter (Signed)
-----   Message from Lendon Collar, RT sent at 09/04/2018 10:27 AM EST ----- Regarding: Lab orders for Friday 09/14/18 Please enter CPE lab orders for 09/14/18. Thanks!

## 2018-09-14 NOTE — Addendum Note (Signed)
Addended by: Pilar Grammes on: 09/14/2018 09:35 AM   Modules accepted: Orders

## 2018-09-19 ENCOUNTER — Other Ambulatory Visit: Payer: Self-pay | Admitting: Family Medicine

## 2018-09-19 ENCOUNTER — Encounter: Payer: BLUE CROSS/BLUE SHIELD | Admitting: Family Medicine

## 2018-10-29 ENCOUNTER — Other Ambulatory Visit: Payer: Self-pay | Admitting: *Deleted

## 2018-10-29 MED ORDER — LOSARTAN POTASSIUM-HCTZ 100-25 MG PO TABS: 1.0000 | ORAL_TABLET | Freq: Every day | ORAL | 0 refills | Status: DC

## 2018-11-06 DIAGNOSIS — C44519 Basal cell carcinoma of skin of other part of trunk: Secondary | ICD-10-CM | POA: Diagnosis not present

## 2018-11-07 ENCOUNTER — Other Ambulatory Visit: Payer: Self-pay | Admitting: Family Medicine

## 2018-11-15 ENCOUNTER — Other Ambulatory Visit (INDEPENDENT_AMBULATORY_CARE_PROVIDER_SITE_OTHER): Payer: Medicare Other

## 2018-11-15 DIAGNOSIS — E785 Hyperlipidemia, unspecified: Secondary | ICD-10-CM

## 2018-11-15 DIAGNOSIS — E039 Hypothyroidism, unspecified: Secondary | ICD-10-CM | POA: Diagnosis not present

## 2018-11-15 LAB — CBC WITH DIFFERENTIAL/PLATELET
BASOS ABS: 0.1 10*3/uL (ref 0.0–0.1)
Basophils Relative: 1.3 % (ref 0.0–3.0)
EOS ABS: 0.2 10*3/uL (ref 0.0–0.7)
Eosinophils Relative: 3.6 % (ref 0.0–5.0)
HCT: 37.5 % (ref 36.0–46.0)
Hemoglobin: 12.7 g/dL (ref 12.0–15.0)
LYMPHS ABS: 1.9 10*3/uL (ref 0.7–4.0)
LYMPHS PCT: 28.2 % (ref 12.0–46.0)
MCHC: 33.9 g/dL (ref 30.0–36.0)
MCV: 76 fl — ABNORMAL LOW (ref 78.0–100.0)
Monocytes Absolute: 0.4 10*3/uL (ref 0.1–1.0)
Monocytes Relative: 6.3 % (ref 3.0–12.0)
NEUTROS ABS: 4 10*3/uL (ref 1.4–7.7)
Neutrophils Relative %: 60.6 % (ref 43.0–77.0)
PLATELETS: 329 10*3/uL (ref 150.0–400.0)
RBC: 4.94 Mil/uL (ref 3.87–5.11)
RDW: 15.9 % — ABNORMAL HIGH (ref 11.5–15.5)
WBC: 6.6 10*3/uL (ref 4.0–10.5)

## 2018-11-15 LAB — LIPID PANEL
CHOLESTEROL: 156 mg/dL (ref 0–200)
HDL: 55.1 mg/dL (ref >39.00)
LDL Cholesterol: 80 mg/dL (ref 0–99)
NonHDL: 100.91
Total CHOL/HDL Ratio: 3
Triglycerides: 104 mg/dL (ref 0.0–149.0)
VLDL: 20.8 mg/dL (ref 0.0–40.0)

## 2018-11-15 LAB — TSH: TSH: 4.23 u[IU]/mL (ref 0.35–4.50)

## 2018-11-15 LAB — T4, FREE: FREE T4: 1.09 ng/dL (ref 0.60–1.60)

## 2018-11-15 LAB — T3, FREE: T3 FREE: 3.6 pg/mL (ref 2.3–4.2)

## 2018-11-15 LAB — HEMOGLOBIN A1C: HEMOGLOBIN A1C: 6.9 % — AB (ref 4.6–6.5)

## 2018-11-19 ENCOUNTER — Ambulatory Visit (INDEPENDENT_AMBULATORY_CARE_PROVIDER_SITE_OTHER): Payer: Medicare Other | Admitting: Family Medicine

## 2018-11-19 ENCOUNTER — Encounter: Payer: Self-pay | Admitting: Family Medicine

## 2018-11-19 VITALS — BP 120/60 | HR 85 | Temp 98.2°F | Ht 59.5 in | Wt 179.5 lb

## 2018-11-19 DIAGNOSIS — Z0000 Encounter for general adult medical examination without abnormal findings: Secondary | ICD-10-CM | POA: Diagnosis not present

## 2018-11-19 NOTE — Progress Notes (Signed)
Dr. Frederico Hamman T. Copland, MD, [facility] [place], [address] Phone: [phone] Fax: [phone]  11/19/2018  Patient: Robin Bass, MRN: [id number], DOB: 1951/02/13, 68 y.o.  Primary Physician:  Owens Loffler, MD   Chief Complaint  Patient presents with  . Welcome to Medicare   Subjective:   Robin Bass is a 68 y.o. pleasant patient who presents for a Welcome to Medicare Exam and general physical:  Health Maintenance Summary Reviewed and updated, unless pt declines services.  Tobacco History Reviewed. Non-smoker Alcohol: No concerns, no excessive use Exercise Habits: Some activity, rec at least 30 mins 5 times a week STD concerns: none Drug Use: None Birth control method: n/a Menses regular: n/a Lumps or breast concerns: no Breast Cancer Family History: no  Stres at home On her last nerve  S/p hyst  Colon [place] - will do cologuard DEXA - will order to hospital Foot Eye exam - having some cataract issues Flu shot. Butch Penny calling.  Mammo upcoming  Lab Results  Component Value Date   HGBA1C 6.9 (H) 11/15/2018      Health Maintenance  Topic Date Due  . COLONOSCOPY  12/30/2002  . OPHTHALMOLOGY EXAM  08/08/2015  . FOOT EXAM  08/23/2015  . DEXA SCAN  12/29/2017  . HEMOGLOBIN A1C  05/16/2019  . MAMMOGRAM  01/03/2020  . PNA vac Low Risk Adult (2 of 2 - PPSV23) 09/06/2020  . TETANUS/TDAP  04/01/2024  . INFLUENZA VACCINE  Completed  . Hepatitis C Screening  Completed  . HIV Screening  Completed   Immunization History  Administered Date(s) Administered  . Influenza Whole 07/31/2008, 07/12/2010  . Influenza, Seasonal, Injecte, Preservative Fre 07/08/2015, 07/26/2016, 08/02/2017  . Influenza,inj,Quad PF,6+ Mos 07/13/2018  . Pneumococcal Conjugate-13 08/21/2014  . Pneumococcal Polysaccharide-23 09/07/2015  . Tdap 04/01/2014    Patient Active Problem List   Diagnosis Date Noted  . Diabetes  mellitus type 2, controlled, without complications ([place]) 61/95/0932    Priority: High  . Hypothyroidism 07/31/2008    Priority: Medium  . Essential hypertension 07/31/2008    Priority: Medium  . Knee pain 09/06/2017  . Hyperlipidemia LDL goal <70 09/07/2015  . Depression 03/31/2012  . Overactive bladder 02/05/2011  . 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 ALLERGIC RHINITIS 07/31/2008  . ASTHMA 07/31/2008  . COPD 07/31/2008  . GERD 07/31/2008   Past Medical History:  Diagnosis Date  . Allergy   . Asthma   . Cancer ([place])    skin  . COPD (chronic obstructive pulmonary disease) ([place])   . Diabetes mellitus type 2, controlled, without complications ([place]) 67/09/4579  . GERD (gastroesophageal reflux disease)   . History of pituitary tumor   . Hypothyroidism   . Overactive bladder   . Prolactin deficiency [facility])    Past Surgical History:  Procedure Laterality Date  . ABDOMINAL HYSTERECTOMY     Social History   Socioeconomic History  . Marital status: Married    Spouse name: Not on file  . Number of children: 1  . Years of education: Not on file  . Highest education level: Not on file  Occupational History  . Occupation: Producer, television/film/video: UNEMPLOYED  Social Needs  . Financial resource strain: Not on file  . Food insecurity:    Worry: Not on file    Inability: Not on file  . Transportation needs:    Medical: Not on file    Non-medical: Not on  file  Tobacco Use  . Smoking status: Never Smoker  . Smokeless tobacco: Never Used  Substance and Sexual Activity  . Alcohol use: No  . Drug use: No  . Sexual activity: Not on file  Lifestyle  . Physical activity:    Days per week: Not on file    Minutes per session: Not on file  . Stress: Not on file  Relationships  . Social connections:    Talks on phone: Not on file    Gets together: Not on file    Attends religious service: Not on file    Active member of club or organization: Not on file    Attends meetings of  clubs or organizations: Not on file    Relationship status: Not on file  . Intimate partner violence:    Fear of current or ex partner: Not on file    Emotionally abused: Not on file    Physically abused: Not on file    Forced sexual activity: Not on file  Other Topics Concern  . Not on file  Social History Narrative  . Not on file   Family History  Problem Relation Age of Onset  . Breast cancer Neg Hx    Allergies  Allergen Reactions  . Sulfonamide Derivatives   . Ace Inhibitors Cough    Medication list has been reviewed and updated.   General: Denies fever, chills, sweats. No significant weight loss. Eyes: Denies blurring,significant itching ENT: Denies earache, sore throat, and hoarseness.  Cardiovascular: Denies chest pains, palpitations, dyspnea on exertion,  Respiratory: Denies cough, dyspnea at rest,wheeezing Breast: no concerns about lumps GI: Denies nausea, vomiting, diarrhea, constipation, change in bowel habits, abdominal pain, melena, hematochezia GU: Denies dysuria, hematuria, urinary hesitancy, nocturia, denies STD risk, no concerns about discharge Musculoskeletal: Denies back pain, joint pain Derm: Denies rash, itching Neuro: Denies  paresthesias, frequent falls, frequent headaches Psych: Denies depression, anxiety Endocrine: Denies cold intolerance, heat intolerance, polydipsia Heme: Denies enlarged lymph nodes Allergy: No hayfever  Objective:   BP 120/60   Pulse 85   Temp 98.2 F (36.8 C) (Oral)   Ht 4' 11.5" (1.511 m)   Wt 179 lb 8 oz (81.4 kg)   SpO2 96%   BMI 35.65 kg/m   The patient completed a fall screen and PHQ-2 and PHQ-9 if necessary, which is documented in the EHR. The CMA/LPN/RN who assisted the patient verbally completed with them and documented results in [facility] EHR.   Hearing Screening   Method: Audiometry   125Hz 250Hz 500Hz 1000Hz 2000Hz 3000Hz 4000Hz 6000Hz 8000Hz  Right ear:   _0 Left ear:   _1 Visual Acuity Screening   Right eye Left eye Both eyes  Without correction: 20/25  20/25  With correction:     Comments: Has Cataract on Left Eye-Unable to see eye chart   GEN: well developed, well nourished, no acute distress Eyes: conjunctiva and lids normal, PERRLA, EOMI ENT: TM clear, nares clear, oral exam WNL Neck: supple, no lymphadenopathy, no thyromegaly, no JVD Pulm: clear to auscultation and percussion, respiratory effort normal CV: regular rate and rhythm, S1-S2, no murmur, rub or gallop, no bruits Chest: no scars, masses, no lumps BREAST: breast exam declined GI: soft, non-tender; no hepatosplenomegaly, masses; active bowel sounds all quadrants GU: GU exam declined Lymph: no cervical, axillary or inguinal adenopathy MSK: gait normal, muscle tone and strength  WNL, no joint swelling, effusions, discoloration, crepitus  SKIN: clear, good turgor, color WNL, no rashes, lesions, or ulcerations Neuro: normal mental status, normal strength, sensation, and motion Psych: alert; oriented to person, place and time, normally interactive and not anxious or depressed in appearance.   All labs reviewed with patient. Lipids: Lab Results  Component Value Date   CHOL 156 11/15/2018   Lab Results  Component Value Date   HDL 55.10 11/15/2018   Lab Results  Component Value Date   LDLCALC 80 11/15/2018   Lab Results  Component Value Date   TRIG 104.0 11/15/2018   Lab Results  Component Value Date   CHOLHDL 3 11/15/2018   CBC: CBC Latest Ref Rng & Units 11/15/2018 09/11/2017 09/05/2016  WBC 4.0 - 10.5 K/uL 6.6 6.7 8.1  Hemoglobin 12.0 - 15.0 g/dL 12.7 12.9 12.8  Hematocrit 36.0 - 46.0 % 37.5 39.5 38.4  Platelets 150.0 - 400.0 K/uL 329.0 357.0 814.4    Basic Metabolic Panel:    Component Value Date/Time   NA 138 09/11/2017 1030   K 3.8 09/11/2017 1030   CL 100 09/11/2017 1030   CO2 31 09/11/2017 1030   BUN 22 09/11/2017 1030   CREATININE 0.73 09/11/2017 1030    GLUCOSE 121 (H) 09/11/2017 1030   CALCIUM 10.0 09/11/2017 1030   Hepatic Function Latest Ref Rng & Units 09/11/2017 09/05/2016 03/07/2016  Total Protein 6.0 - 8.3 g/dL 7.5 7.3 7.7  Albumin 3.5 - 5.2 g/dL 4.3 4.1 4.3  AST 0 - 37 U/L _0 ALT 0 - 35 U/L _1 Alk Phosphatase 39 - 117 U/L 102 114 109  Total Bilirubin 0.2 - 1.2 mg/dL 0.5 0.4 0.3  Bilirubin, Direct 0.0 - 0.3 mg/dL 0.0 0.1 0.1    Lab Results  Component Value Date   HGBA1C 6.9 (H) 11/15/2018   Lab Results  Component Value Date   TSH 4.23 11/15/2018    No results found.  Assessment and Plan:   Healthcare maintenance - Plan: DG Bone Density  Cologuard today Given names of new dermatologists Weight loss and exercise.  DEXA ordered She has f/u mammo upcoming Flu has been done Reviewed importance of DM eye exams  Health Maintenance Exam: The patient's preventative maintenance and recommended screening tests for an annual wellness exam were reviewed in full today. Brought up to date unless services declined.  Counselled on the importance of diet, exercise, and its role in overall health and mortality. The patient's FH and SH was reviewed, including their home life, tobacco status, and drug and alcohol status.  Follow-up in 1 year for physical exam or additional follow-up below.  I have personally reviewed the Medicare Annual Wellness questionnaire and have noted 1. The patient's medical and social history 2. Their use of alcohol, tobacco or illicit drugs 3. Their current medications and supplements 4. The patient's functional ability including ADL's, fall risks, home safety risks and hearing or visual             impairment. 5. Diet and physical activities 6. Evidence for depression or mood disorders 7. Reviewed Updated provider list, see scanned forms and [facility] Snapshot.   8.         Advanced directives reviewed - she will bring in her HCPOA.  The patients weight, height, BMI and visual acuity have  been recorded in the chart I have made referrals, counseling and provided education to the patient based review of the above and I have  provided the pt with a written personalized care plan for preventive services.  I have provided the patient with a copy of your personalized plan for preventive services. Instructed to take the time to review along with their updated medication list.  Follow-up: No follow-ups on file. Or follow-up in 1 year unless noted.  Signed,  [name]. Kharson Rasmusson, MD   Allergies as of 11/19/2018      Reactions   Sulfonamide Derivatives    Ace Inhibitors Cough      Medication List       Accurate as of November 19, 2018  1:15 PM. Always use your most recent med list.        amLODipine 10 MG tablet Commonly known as:  NORVASC TAKE 1 TABLET BY MOUTH ONCE A DAY   atorvastatin 20 MG tablet Commonly known as:  LIPITOR TAKE 1 TABLET BY MOUTH ONCE DAILY   benzonatate 200 MG capsule Commonly known as:  TESSALON Take 1 capsule (200 mg total) by mouth 3 (three) times daily as needed. Swallow whole, do not bite the pill   FLUoxetine 20 MG capsule Commonly known as:  PROZAC TAKE 1 CAPSULE BY MOUTH ONCE DAILY   levothyroxine 50 MCG tablet Commonly known as:  SYNTHROID, LEVOTHROID TAKE 1 TABLET BY MOUTH ONCE A DAY   losartan-hydrochlorothiazide 100-25 MG tablet Commonly known as:  HYZAAR Take 1 tablet by mouth daily.   metFORMIN 500 MG 24 hr tablet Commonly known as:  GLUCOPHAGE-XR TAKE 1 TABLET BY MOUTH ONCE A DAY WITH BREAKFAST   ONE TOUCH ULTRA TEST test strip Generic drug:  glucose blood USE TO CHECK BLOOD SUGAR TWICE A DAY AND AS DIRECTED   ONETOUCH DELICA LANCETS 29H Misc Use to check blood sugar two times a day

## 2018-11-26 ENCOUNTER — Other Ambulatory Visit: Payer: Self-pay | Admitting: Family Medicine

## 2018-11-30 ENCOUNTER — Telehealth: Payer: Self-pay | Admitting: Family Medicine

## 2018-11-30 NOTE — Telephone Encounter (Signed)
Tried calling pt  No answer regarding bone density If pt calls back transfer to robin.  If im not here anyone up front can help

## 2018-12-03 ENCOUNTER — Other Ambulatory Visit: Payer: Self-pay | Admitting: Family Medicine

## 2018-12-17 ENCOUNTER — Other Ambulatory Visit: Payer: Self-pay | Admitting: Family Medicine

## 2019-01-03 ENCOUNTER — Other Ambulatory Visit: Payer: Medicare Other

## 2019-01-10 ENCOUNTER — Telehealth: Payer: Self-pay

## 2019-01-10 MED ORDER — CLONAZEPAM 0.5 MG PO TABS: 0.5000 mg | ORAL_TABLET | Freq: Two times a day (BID) | ORAL | 1 refills | Status: DC | PRN

## 2019-01-10 NOTE — Telephone Encounter (Signed)
This was done.

## 2019-01-10 NOTE — Telephone Encounter (Signed)
Pt called asking if she could get a refill of Clonazepam 0.5mg . She has not needed it for a long time (not on med list anymore). Social distancing/Covid 19 scare is really stressing her out and would like a rx for it. [place] Call pt back at [phone]

## 2019-01-28 ENCOUNTER — Other Ambulatory Visit: Payer: Self-pay | Admitting: *Deleted

## 2019-01-28 ENCOUNTER — Other Ambulatory Visit: Payer: Medicare Other

## 2019-01-28 MED ORDER — LOSARTAN POTASSIUM-HCTZ 100-25 MG PO TABS: 1.0000 | ORAL_TABLET | Freq: Every day | ORAL | 1 refills | Status: DC

## 2019-03-01 ENCOUNTER — Encounter: Payer: Self-pay | Admitting: Family Medicine

## 2019-03-01 ENCOUNTER — Ambulatory Visit (INDEPENDENT_AMBULATORY_CARE_PROVIDER_SITE_OTHER): Payer: Medicare Other | Admitting: Family Medicine

## 2019-03-01 VITALS — Temp 97.3°F | Ht 59.5 in | Wt 175.0 lb

## 2019-03-01 DIAGNOSIS — R05 Cough: Secondary | ICD-10-CM | POA: Diagnosis not present

## 2019-03-01 DIAGNOSIS — R051 Acute cough: Secondary | ICD-10-CM | POA: Insufficient documentation

## 2019-03-01 DIAGNOSIS — R059 Cough, unspecified: Secondary | ICD-10-CM

## 2019-03-01 MED ORDER — ALBUTEROL SULFATE HFA 108 (90 BASE) MCG/ACT IN AERS: 2.0000 | INHALATION_SPRAY | Freq: Four times a day (QID) | RESPIRATORY_TRACT | 2 refills | Status: DC | PRN

## 2019-03-01 MED ORDER — GUAIFENESIN-CODEINE 100-10 MG/5ML PO SYRP: 5.0000 mL | ORAL_SOLUTION | Freq: Two times a day (BID) | ORAL | 0 refills | Status: DC | PRN

## 2019-03-01 NOTE — Progress Notes (Signed)
Virtual visit completed through [employer]. Due to national recommendations of social distancing due to [place] 19, a virtual visit is felt to be most appropriate for this patient at this time.   Patient location: home Provider location: [place] at [facility], office If any vitals were documented, they were collected by patient at home unless specified below.    Temp (!) 97.3 F (36.3 C) (Oral)   Ht 4' 11.5" (1.511 m)   Wt 175 lb (79.4 kg)   BMI 34.75 kg/m    CC: cough Subjective:    Patient ID: Robin Bass, female    DOB: 09/13/1951, 68 y.o.   MRN: [id number]  HPI: Robin Bass is a 68 y.o. female presenting on 03/01/2019 for Cough (C/o dry, "croopy" cough. Denies fever or SOB. Started about 1.5 wks ago. Tried sinus med, cold/flu meds and cough syrup.  Says she gets this same cough every yr and has developed into walking pneumonia once. So pt is concerned. )   1.5 wk h/o "croupy" cough initially dry now mildly productive but nothing is really coming up. PNDrainage worse with cough. Dull achey headache. Overall feels well. L bottom tooth hurts - pending crown/dental work (known tooth issue). No significant allergy symptoms other than rhinorrhea.   No fever/chills, wheezing, dyspnea, malaise, ear pain, ST. No changes to taste or smell, or body aches.  Known asthma and COPD.  Tends to get bronchitis once a year. She did have walking pneumonia 09/2017.   Treating with OTC delsym and sinus remedies with temporary relief. Some aspirin use as well.   Daughter has had cough for 8 weeks - dx with sinus issue.  Husband also has cough - attributed to working on deck exposed to saw wood.  No smoke exposure.   Known well controlled diabetes - recent sugars elevated - attributes to cough syrup.      Relevant past medical, surgical, family and social history reviewed and updated as indicated. Interim medical history since our last visit reviewed. Allergies and medications reviewed and updated.  Outpatient Medications Prior to Visit  Medication Sig Dispense Refill  . amLODipine (NORVASC) 10 MG tablet TAKE 1 TABLET BY MOUTH ONCE A DAY 90 tablet 3  . atorvastatin (LIPITOR) 20 MG tablet TAKE 1 TABLET BY MOUTH ONCE A DAY 90 tablet 3  . benzonatate (TESSALON) 200 MG capsule Take 1 capsule (200 mg total) by mouth 3 (three) times daily as needed. Swallow whole, do not bite the pill 30 capsule 1  . clonazePAM (KLONOPIN) 0.5 MG tablet Take 1 tablet (0.5 mg total) by mouth 2 (two) times daily as needed for anxiety. 30 tablet 1  . FLUoxetine (PROZAC) 20 MG capsule TAKE 1 CAPSULE BY MOUTH ONCE DAILY 90 capsule 1  . levothyroxine (SYNTHROID, LEVOTHROID) 50 MCG tablet TAKE 1 TABLET BY MOUTH ONCE A DAY. TAKE ON AN EMPTY STOMACH WITH A GLASS OF WATER ATLEAST 30-60 MIN BEFORE BREAKFAST 90 tablet 3  . losartan-hydrochlorothiazide (HYZAAR) 100-25 MG tablet Take 1 tablet by mouth daily. 90 tablet 1  . metFORMIN (GLUCOPHAGE-XR) 500 MG 24 hr tablet TAKE 1 TABLET BY MOUTH ONCE A DAY WITH BREAKFAST. 90 tablet 1  . ONE TOUCH ULTRA TEST test strip USE TO CHECK BLOOD SUGAR TWICE A DAY AND AS DIRECTED 100 each 6  . ONETOUCH DELICA LANCETS 12I MISC Use to check blood sugar two times a day 100 each 11   No facility-administered medications prior to visit.      Per  HPI unless specifically indicated in ROS section below Review of Systems Objective:    Temp (!) 97.3 F (36.3 C) (Oral)   Ht 4' 11.5" (1.511 m)   Wt 175 lb (79.4 kg)   BMI 34.75 kg/m   Wt Readings from Last 3 Encounters:  03/01/19 175 lb (79.4 kg)  11/19/18 179 lb 8 oz (81.4 kg)  01/15/18 172 lb (78 kg)     Physical exam: Gen: alert, NAD, not ill appearing Pulm: speaks in complete sentences without increased work of breathing. No cough during visit. Psych: normal mood, normal thought content       Assessment & Plan:   Problem List Items Addressed This Visit    Cough - Primary    Overall well appearing, not consistent with PNA or COPD  or asthma exacerbation. Anticipate self-limited viral process. Discussed current virus going around is coronavirus, did recommend continued self-isolation, discussed hand washing and wearing mask at home. Rx cheratussin, albuterol inhaler. Update if not improving with treatment. Red flags to notify us sooner also reviewed. Pt agrees with plan.           Meds ordered this encounter  Medications  . guaiFENesin-codeine (CHERATUSSIN AC) 100-10 MG/5ML syrup    Sig: Take 5 mLs by mouth 2 (two) times daily as needed for cough (sedation precautions).    Dispense:  120 mL    Refill:  0  . albuterol (VENTOLIN HFA) 108 (90 Base) MCG/ACT inhaler    Sig: Inhale 2 puffs into the lungs every 6 (six) hours as needed for wheezing or shortness of breath.    Dispense:  1 Inhaler    Refill:  2   No orders of the defined types were placed in this encounter.   Follow up plan: Return if symptoms worsen or fail to improve.  Ria Bush, MD

## 2019-03-01 NOTE — Assessment & Plan Note (Addendum)
Overall well appearing, not consistent with PNA or COPD or asthma exacerbation. Anticipate self-limited viral process. Discussed current virus going around is coronavirus, did recommend continued self-isolation, discussed hand washing and wearing mask at home. Rx cheratussin, albuterol inhaler. Update if not improving with treatment. Red flags to notify us sooner also reviewed. Pt agrees with plan.

## 2019-03-08 DIAGNOSIS — N390 Urinary tract infection, site not specified: Secondary | ICD-10-CM | POA: Diagnosis not present

## 2019-03-11 ENCOUNTER — Other Ambulatory Visit: Payer: Self-pay | Admitting: *Deleted

## 2019-03-11 MED ORDER — FLUOXETINE HCL 20 MG PO CAPS: 20.0000 mg | ORAL_CAPSULE | Freq: Every day | ORAL | 1 refills | Status: DC

## 2019-04-22 ENCOUNTER — Other Ambulatory Visit: Payer: Self-pay | Admitting: Family Medicine

## 2019-05-20 ENCOUNTER — Other Ambulatory Visit: Payer: Self-pay | Admitting: *Deleted

## 2019-05-20 MED ORDER — METFORMIN HCL ER 500 MG PO TB24: ORAL_TABLET | ORAL | 1 refills | Status: DC

## 2019-05-23 MED ORDER — METFORMIN HCL ER 500 MG PO TB24: ORAL_TABLET | ORAL | 1 refills | Status: DC

## 2019-05-23 NOTE — Addendum Note (Signed)
Addended by: Carter Kitten on: 05/23/2019 02:38 PM   Modules accepted: Orders

## 2019-05-23 NOTE — Telephone Encounter (Signed)
Received fax stating pharmacy never received refill that was sent in on 05/20/2019 for Metformin XR 500 mg.  Refill resent to [facility].

## 2019-07-23 ENCOUNTER — Other Ambulatory Visit: Payer: Self-pay | Admitting: Family Medicine

## 2019-09-09 ENCOUNTER — Other Ambulatory Visit: Payer: Self-pay | Admitting: *Deleted

## 2019-09-09 MED ORDER — FLUOXETINE HCL 20 MG PO CAPS: 20.0000 mg | ORAL_CAPSULE | Freq: Every day | ORAL | 0 refills | Status: DC

## 2019-09-13 ENCOUNTER — Telehealth: Payer: Self-pay | Admitting: Family Medicine

## 2019-09-13 MED ORDER — FLUOXETINE HCL 20 MG PO CAPS: 20.0000 mg | ORAL_CAPSULE | Freq: Every day | ORAL | 0 refills | Status: DC

## 2019-09-13 MED ORDER — LEVOTHYROXINE SODIUM 50 MCG PO TABS: ORAL_TABLET | ORAL | 0 refills | Status: DC

## 2019-09-13 NOTE — Addendum Note (Signed)
Addended by: Carter Kitten on: 09/13/2019 02:51 PM   Modules accepted: Orders

## 2019-09-13 NOTE — Telephone Encounter (Signed)
Spoke with Robin Bass.  She is needing a refill on her Fluoxetine.  I am showing that we refilled it on 09/09/2019.  I called and spoke with [facility].  They state they never received the refill we sent on 09/09/2019.  I resent refill while on the phone with the pharmacist.  He received it and will get it ready for the patient.  Robin Bass notified that they are getting it ready for her.

## 2019-09-13 NOTE — Telephone Encounter (Signed)
Patient called about medication.  I let her know that she should have another refill left.  Patient said she has 3 pills left and the pharmacy won't give her anymore medication.  Patient said she's been taking 1 pill a day. Patient uses [facility].

## 2019-10-22 ENCOUNTER — Telehealth: Payer: Self-pay | Admitting: Family Medicine

## 2019-10-22 NOTE — Telephone Encounter (Signed)
Please schedule MWV with Nurse and CPE with Dr. Lorelei Pont for end of January/first of February.

## 2019-10-23 NOTE — Telephone Encounter (Signed)
2/17 labs 2/17 medicare wellness 2/22 cpx  Pt aware  She wanted to let you know that she has not had her bone density/mammogram/or eye exam this year do to covid

## 2019-11-19 ENCOUNTER — Other Ambulatory Visit: Payer: Self-pay | Admitting: *Deleted

## 2019-11-19 MED ORDER — ATORVASTATIN CALCIUM 20 MG PO TABS: 20.0000 mg | ORAL_TABLET | Freq: Every day | ORAL | 0 refills | Status: DC

## 2019-11-20 ENCOUNTER — Ambulatory Visit: Payer: Medicare Other

## 2019-11-28 ENCOUNTER — Ambulatory Visit: Payer: Medicare Other

## 2019-11-29 ENCOUNTER — Ambulatory Visit: Payer: Medicare Other | Attending: Internal Medicine

## 2019-11-29 DIAGNOSIS — Z23 Encounter for immunization: Secondary | ICD-10-CM

## 2019-11-29 NOTE — Progress Notes (Signed)
   Covid-19 Vaccination Clinic  Name:  Robin Bass    MRN: [id number] DOB: 1951-08-16  11/29/2019  [name] was observed post Covid-19 immunization for 15 minutes without incidence. She was provided with Vaccine Information Sheet and instruction to access the V-Safe system.   [name] was instructed to call 911 with any severe reactions post vaccine: [name] Kitchen Difficulty breathing  . Swelling of your face and throat  . A fast heartbeat  . A bad rash all over your body  . Dizziness and weakness    Immunizations Administered    Name Date Dose VIS Date Route   Pfizer COVID-19 Vaccine 11/29/2019  5:15 PM 0.3 mL 10/04/2019 Intramuscular   Manufacturer: [place]   Lot: [id number]   [place]: [id number]

## 2019-12-02 ENCOUNTER — Ambulatory Visit
Admission: EM | Admit: 2019-12-02 | Discharge: 2019-12-02 | Disposition: A | Discharge status: Home / Self Care | Payer: Medicare Other | Attending: Emergency Medicine | Admitting: Emergency Medicine

## 2019-12-02 ENCOUNTER — Other Ambulatory Visit: Payer: Self-pay

## 2019-12-02 ENCOUNTER — Other Ambulatory Visit: Payer: Self-pay | Admitting: Family Medicine

## 2019-12-02 DIAGNOSIS — E559 Vitamin D deficiency, unspecified: Secondary | ICD-10-CM

## 2019-12-02 DIAGNOSIS — R319 Hematuria, unspecified: Secondary | ICD-10-CM | POA: Diagnosis not present

## 2019-12-02 DIAGNOSIS — N390 Urinary tract infection, site not specified: Secondary | ICD-10-CM | POA: Diagnosis not present

## 2019-12-02 DIAGNOSIS — E119 Type 2 diabetes mellitus without complications: Secondary | ICD-10-CM

## 2019-12-02 DIAGNOSIS — R350 Frequency of micturition: Secondary | ICD-10-CM

## 2019-12-02 DIAGNOSIS — E785 Hyperlipidemia, unspecified: Secondary | ICD-10-CM

## 2019-12-02 DIAGNOSIS — Z79899 Other long term (current) drug therapy: Secondary | ICD-10-CM

## 2019-12-02 DIAGNOSIS — E039 Hypothyroidism, unspecified: Secondary | ICD-10-CM

## 2019-12-02 LAB — POCT URINALYSIS DIP (MANUAL ENTRY)
Glucose, UA: NEGATIVE mg/dL
Nitrite, UA: NEGATIVE
Protein Ur, POC: 100 mg/dL — AB
Spec Grav, UA: 1.025 (ref 1.010–1.025)
Urobilinogen, UA: 2 E.U./dL — AB
pH, UA: 6 (ref 5.0–8.0)

## 2019-12-02 MED ORDER — NITROFURANTOIN MONOHYD MACRO 100 MG PO CAPS: 100.0000 mg | ORAL_CAPSULE | Freq: Two times a day (BID) | ORAL | 0 refills | Status: AC

## 2019-12-02 NOTE — ED Triage Notes (Signed)
Patient complains of urinary frequency and urgency since 3 days ago. Patient states that she started noticing hematuria this morning.

## 2019-12-02 NOTE — ED Provider Notes (Signed)
Roderic Palau    CSN: [id number] Arrival date & time: 12/02/19  1042      History   Chief Complaint Chief Complaint  Patient presents with  . Urinary Frequency    HPI Robin Bass is a 69 y.o. female.   Patient presents with 3-day history of urinary frequency and urgency.  She states she developed dysuria and hematuria this morning.  Patient states she has a history of cystitis and UTIs; she has previously been followed by urology.  She states she has taken Macrobid successfully in the past.  She denies fever, chills, abdominal pain, vomiting, back pain, vaginal symptoms, or other symptoms.  The history is provided by the patient.    Past Medical History:  Diagnosis Date  . Allergy   . Asthma   . Cancer ([place])    skin  . COPD (chronic obstructive pulmonary disease) ([place])   . Diabetes mellitus type 2, controlled, without complications ([place]) 99991111  . GERD (gastroesophageal reflux disease)   . History of pituitary tumor   . Hypothyroidism   . Overactive bladder   . Prolactin deficiency [facility])     Patient Active Problem List   Diagnosis Date Noted  . Cough 03/01/2019  . Knee pain 09/06/2017  . Hyperlipidemia LDL goal <70 09/07/2015  . Diabetes mellitus type 2, controlled, without complications ([place]) 99991111  . Depression 03/31/2012  . Overactive bladder 02/05/2011  . 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 Hypothyroidism 07/31/2008  . Essential hypertension 07/31/2008  . ALLERGIC RHINITIS 07/31/2008  . ASTHMA 07/31/2008  . COPD 07/31/2008  . GERD 07/31/2008    Past Surgical History:  Procedure Laterality Date  . ABDOMINAL HYSTERECTOMY      OB History   No obstetric history on file.      Home Medications    Prior to Admission medications   Medication Sig Start Date End Date Taking? Authorizing Provider  albuterol (VENTOLIN HFA) 108 (90 Base) MCG/ACT inhaler Inhale 2 puffs into the lungs every 6 (six) hours as needed for wheezing  or shortness of breath. 03/01/19  Yes Ria Bush, MD  amLODipine (NORVASC) 10 MG tablet TAKE 1 TABLET BY MOUTH ONCE A DAY 12/17/18  Yes Copland, Frederico Hamman, MD  atorvastatin (LIPITOR) 20 MG tablet Take 1 tablet (20 mg total) by mouth daily. 11/19/19  Yes Copland, Frederico Hamman, MD  clonazePAM (KLONOPIN) 0.5 MG tablet Take 1 tablet (0.5 mg total) by mouth 2 (two) times daily as needed for anxiety. 01/10/19  Yes Copland, Frederico Hamman, MD  FLUoxetine (PROZAC) 20 MG capsule Take 1 capsule (20 mg total) by mouth daily. 09/13/19  Yes Copland, Frederico Hamman, MD  levothyroxine (SYNTHROID) 50 MCG tablet TAKE 1 TABLET BY MOUTH ONCE A DAY. TAKE ON AN EMPTY STOMACH WITH A GLASS OF WATER ATLEAST 30-60 MIN BEFORE BREAKFAST 09/13/19  Yes Copland, Frederico Hamman, MD  losartan-hydrochlorothiazide (HYZAAR) 100-25 MG tablet TAKE 1 TABLET BY MOUTH ONCE A DAY 10/22/19  Yes Copland, Spencer, MD  metFORMIN (GLUCOPHAGE-XR) 500 MG 24 hr tablet TAKE 1 TABLET BY MOUTH ONCE A DAY WITH BREAKFAST. 05/23/19  Yes Copland, Frederico Hamman, MD  ONE TOUCH ULTRA TEST test strip USE TO CHECK BLOOD SUGAR TWICE A DAY AND AS DIRECTED 06/20/15  Yes Copland, Frederico Hamman, MD  [facility] DELICA LANCETS 99991111 MISC Use to check blood sugar two times a day 06/04/14  Yes Copland, Frederico Hamman, MD  benzonatate (TESSALON) 200 MG capsule Take 1 capsule (200 mg total) by mouth 3 (three) times daily as needed. Swallow whole, do not bite  the pill 10/05/17   Tower, Roque Lias A, MD  guaiFENesin-codeine [facility]) 100-10 MG/5ML syrup Take 5 mLs by mouth 2 (two) times daily as needed for cough (sedation precautions). 03/01/19   Ria Bush, MD  nitrofurantoin, macrocrystal-monohydrate, (MACROBID) 100 MG capsule Take 1 capsule (100 mg total) by mouth 2 (two) times daily for 7 days. 12/02/19 12/09/19  Sharion Balloon, NP    Family History Family History  Problem Relation Age of Onset  . Hypertension Mother   . Heart attack Father   . Breast cancer Neg Hx     Social History Social History    Tobacco Use  . Smoking status: Never Smoker  . Smokeless tobacco: Never Used  Substance Use Topics  . Alcohol use: No  . Drug use: No     Allergies   Sulfonamide derivatives and Ace inhibitors   Review of Systems Review of Systems  Constitutional: Negative for chills and fever.  HENT: Negative for ear pain and sore throat.   Eyes: Negative for pain and visual disturbance.  Respiratory: Negative for cough and shortness of breath.   Cardiovascular: Negative for chest pain and palpitations.  Gastrointestinal: Negative for abdominal pain and vomiting.  Genitourinary: Positive for dysuria, frequency and urgency. Negative for flank pain, hematuria, pelvic pain and vaginal discharge.  Musculoskeletal: Negative for arthralgias and back pain.  Skin: Negative for color change and rash.  Neurological: Negative for seizures and syncope.  All other systems reviewed and are negative.    Physical Exam Triage Vital Signs ED Triage Vitals [12/02/19 1050]  Enc Vitals Group     BP (!) 151/73     Pulse Rate 91     Resp 14     Temp 98.7 F (37.1 C)     Temp Source Oral     SpO2 96 %     Weight 175 lb (79.4 kg)     Height 4\' 11"  (1.499 m)     Head Circumference      Peak Flow      Pain Score 7     Pain Loc      Pain Edu?      Excl. in [place]?    No data found.  Updated Vital Signs BP (!) 151/73 (BP Location: Left Arm)   Pulse 91   Temp 98.7 F (37.1 C) (Oral)   Resp 14   Ht 4\' 11"  (1.499 m)   Wt 175 lb (79.4 kg)   SpO2 96%   BMI 35.35 kg/m   Visual Acuity Right Eye Distance:   Left Eye Distance:   Bilateral Distance:    Right Eye Near:   Left Eye Near:    Bilateral Near:     Physical Exam Vitals and nursing note reviewed.  Constitutional:      General: She is not in acute distress.    Appearance: She is well-developed.  HENT:     Head: Normocephalic and atraumatic.     Mouth/Throat:     Mouth: Mucous membranes are moist.  Eyes:     Conjunctiva/sclera:  Conjunctivae normal.  Cardiovascular:     Rate and Rhythm: Normal rate and regular rhythm.     Heart sounds: No murmur.  Pulmonary:     Effort: Pulmonary effort is normal. No respiratory distress.     Breath sounds: Normal breath sounds.  Abdominal:     Palpations: Abdomen is soft.     Tenderness: There is no abdominal tenderness. There is no right CVA tenderness,  left CVA tenderness, guarding or rebound.  Musculoskeletal:     Cervical back: Neck supple.  Skin:    General: Skin is warm and dry.  Neurological:     General: No focal deficit present.     Mental Status: She is alert and oriented to person, place, and time.  Psychiatric:        Mood and Affect: Mood normal.        Behavior: Behavior normal.      UC Treatments / Results  Labs (all labs ordered are listed, but only abnormal results are displayed) Labs Reviewed  POCT URINALYSIS DIP (MANUAL ENTRY) - Abnormal; Notable for the following components:      Result Value   Color, UA red (*)    Bilirubin, UA small (*)    Ketones, POC UA trace (5) (*)    Blood, UA large (*)    Protein Ur, POC =100 (*)    Urobilinogen, UA 2.0 (*)    Leukocytes, UA Small (1+) (*)    All other components within normal limits  URINE CULTURE    EKG   Radiology No results found.  Procedures Procedures (including critical care time)  Medications Ordered in UC Medications - No data to display  Initial Impression / Assessment and Plan / UC Course  I have reviewed the triage vital signs and the nursing notes.  Pertinent labs & imaging results that were available during my care of the patient were reviewed by me and considered in my medical decision making (see chart for details).    UTI with hematuria.  Urine culture pending.  Treating with Macrobid.  Instructed patient to follow-up with her PCP if her symptoms are not resolving.  Patient agrees to plan of care.     Final Clinical Impressions(s) / UC Diagnoses   Final diagnoses:    Urinary tract infection with hematuria, site unspecified     Discharge Instructions     Your urine shows signs of an infection.  Urine culture is pending and we will call you if it indicates a different antibiotic is needed.    Take the antibiotic as directed.    Follow-up with your primary care provider if your symptoms are not resolving.        ED Prescriptions    Medication Sig Dispense Auth. Provider   nitrofurantoin, macrocrystal-monohydrate, (MACROBID) 100 MG capsule Take 1 capsule (100 mg total) by mouth 2 (two) times daily for 7 days. 14 capsule Sharion Balloon, NP     PDMP not reviewed this encounter.   Sharion Balloon, NP 12/02/19 1125

## 2019-12-02 NOTE — Discharge Instructions (Addendum)
Your urine shows signs of an infection.  Urine culture is pending and we will call you if it indicates a different antibiotic is needed.    Take the antibiotic as directed.    Follow-up with your primary care provider if your symptoms are not resolving.

## 2019-12-03 LAB — URINE CULTURE

## 2019-12-07 ENCOUNTER — Other Ambulatory Visit: Payer: Self-pay | Admitting: Family Medicine

## 2019-12-11 ENCOUNTER — Other Ambulatory Visit: Payer: Self-pay

## 2019-12-11 ENCOUNTER — Other Ambulatory Visit (INDEPENDENT_AMBULATORY_CARE_PROVIDER_SITE_OTHER): Payer: Medicare Other

## 2019-12-11 ENCOUNTER — Ambulatory Visit (INDEPENDENT_AMBULATORY_CARE_PROVIDER_SITE_OTHER): Payer: Medicare Other

## 2019-12-11 VITALS — Wt 175.0 lb

## 2019-12-11 DIAGNOSIS — E559 Vitamin D deficiency, unspecified: Secondary | ICD-10-CM

## 2019-12-11 DIAGNOSIS — E785 Hyperlipidemia, unspecified: Secondary | ICD-10-CM

## 2019-12-11 DIAGNOSIS — E119 Type 2 diabetes mellitus without complications: Secondary | ICD-10-CM

## 2019-12-11 DIAGNOSIS — E039 Hypothyroidism, unspecified: Secondary | ICD-10-CM

## 2019-12-11 DIAGNOSIS — Z0000 Encounter for general adult medical examination without abnormal findings: Secondary | ICD-10-CM

## 2019-12-11 DIAGNOSIS — Z79899 Other long term (current) drug therapy: Secondary | ICD-10-CM

## 2019-12-11 LAB — LIPID PANEL
Cholesterol: 160 mg/dL (ref 0–200)
HDL: 55.8 mg/dL (ref >39.00)
LDL Cholesterol: 80 mg/dL (ref 0–99)
NonHDL: 104.62
Total CHOL/HDL Ratio: 3
Triglycerides: 124 mg/dL (ref 0.0–149.0)
VLDL: 24.8 mg/dL (ref 0.0–40.0)

## 2019-12-11 LAB — BASIC METABOLIC PANEL
BUN: 17 mg/dL (ref 6–23)
CO2: 33 mEq/L — ABNORMAL HIGH (ref 19–32)
Calcium: 9.6 mg/dL (ref 8.4–10.5)
Chloride: 101 mEq/L (ref 96–112)
Creatinine, Ser: 0.75 mg/dL (ref 0.40–1.20)
GFR: 77.09 mL/min (ref >60.00)
Glucose, Bld: 133 mg/dL — ABNORMAL HIGH (ref 70–99)
Potassium: 3.7 mEq/L (ref 3.5–5.1)
Sodium: 141 mEq/L (ref 135–145)

## 2019-12-11 LAB — CBC WITH DIFFERENTIAL/PLATELET
Basophils Absolute: 0.1 10*3/uL (ref 0.0–0.1)
Basophils Relative: 0.6 % (ref 0.0–3.0)
Eosinophils Absolute: 0.2 10*3/uL (ref 0.0–0.7)
Eosinophils Relative: 1.9 % (ref 0.0–5.0)
HCT: 38.2 % (ref 36.0–46.0)
Hemoglobin: 12.6 g/dL (ref 12.0–15.0)
Lymphocytes Relative: 29.1 % (ref 12.0–46.0)
Lymphs Abs: 2.4 10*3/uL (ref 0.7–4.0)
MCHC: 33 g/dL (ref 30.0–36.0)
MCV: 77.2 fl — ABNORMAL LOW (ref 78.0–100.0)
Monocytes Absolute: 0.5 10*3/uL (ref 0.1–1.0)
Monocytes Relative: 6 % (ref 3.0–12.0)
Neutro Abs: 5.1 10*3/uL (ref 1.4–7.7)
Neutrophils Relative %: 62.4 % (ref 43.0–77.0)
Platelets: 352 10*3/uL (ref 150.0–400.0)
RBC: 4.95 Mil/uL (ref 3.87–5.11)
RDW: 15.9 % — ABNORMAL HIGH (ref 11.5–15.5)
WBC: 8.1 10*3/uL (ref 4.0–10.5)

## 2019-12-11 LAB — HEPATIC FUNCTION PANEL
ALT: 13 U/L (ref 0–35)
AST: 13 U/L (ref 0–37)
Albumin: 4.2 g/dL (ref 3.5–5.2)
Alkaline Phosphatase: 114 U/L (ref 39–117)
Bilirubin, Direct: 0.1 mg/dL (ref 0.0–0.3)
Total Bilirubin: 0.5 mg/dL (ref 0.2–1.2)
Total Protein: 7 g/dL (ref 6.0–8.3)

## 2019-12-11 LAB — T4, FREE: Free T4: 0.98 ng/dL (ref 0.60–1.60)

## 2019-12-11 LAB — HEMOGLOBIN A1C: Hgb A1c MFr Bld: 7.9 % — ABNORMAL HIGH (ref 4.6–6.5)

## 2019-12-11 LAB — VITAMIN D 25 HYDROXY (VIT D DEFICIENCY, FRACTURES): VITD: 10.99 ng/mL — ABNORMAL LOW (ref 30.00–100.00)

## 2019-12-11 LAB — MICROALBUMIN / CREATININE URINE RATIO
Creatinine,U: 259.8 mg/dL
Microalb Creat Ratio: 0.7 mg/g (ref 0.0–30.0)
Microalb, Ur: 1.8 mg/dL (ref 0.0–1.9)

## 2019-12-11 LAB — T3, FREE: T3, Free: 3.6 pg/mL (ref 2.3–4.2)

## 2019-12-11 LAB — TSH: TSH: 5.06 u[IU]/mL — ABNORMAL HIGH (ref 0.35–4.50)

## 2019-12-11 NOTE — Progress Notes (Signed)
PCP notes:  Health Maintenance: Colonoscopy- postpone due to pandemic Mammogram- postpone due to pandemic Dexa- postpone due to pandemic  Eye exam- postpone due to pandemic   Abnormal Screenings: none   Patient concerns: Hard to bend ring finger on right hand   Nurse concerns: none   Next PCP appt.: 12/16/2019 @ 2:20 pm

## 2019-12-11 NOTE — Progress Notes (Signed)
Subjective:   Robin Bass is a 69 y.o. female who presents for Medicare Annual (Subsequent) preventive examination.  Review of Systems: N/A   This visit is being conducted through telemedicine via telephone at the nurse health advisor's home address due to the COVID-19 pandemic. This patient has given me verbal consent via doximity to conduct this visit, patient states they are participating from their home address. Patient and myself are on the telephone call. There is no referral for this visit. Some vital signs may be absent or patient reported.    Patient identification: identified by name, DOB, and current address   Cardiac Risk Factors include: advanced age (>51men, >31 women);diabetes mellitus;hypertension     Objective:     Vitals: Wt 175 lb (79.4 kg)   BMI 35.35 kg/m   Body mass index is 35.35 kg/m.  Advanced Directives 12/11/2019 12/02/2019  Does Patient Have a Medical Advance Directive? Yes No  Type of Paramedic of [place];Living will -  Copy of [place] in Chart? No - copy requested -    Tobacco Social History   Tobacco Use  Smoking Status Never Smoker  Smokeless Tobacco Never Used     Counseling given: Not Answered   Clinical Intake:  Pre-visit preparation completed: Yes  Pain : No/denies pain Pain Score: 0-No pain     Nutritional Risks: Nausea/ vomitting/ diarrhea(diarrhea yesterday only) Diabetes: Yes CBG done?: No Did pt. bring in CBG monitor from home?: No  How often do you need to have someone help you when you read instructions, pamphlets, or other written materials from your doctor or pharmacy?: 1 - Never What is the last grade level you completed in school?: 12th  Interpreter Needed?: No  Information entered by :: CJohnson, LPN  Past Medical History:  Diagnosis Date  . Allergy   . Asthma   . Cancer ([place])    skin  . COPD (chronic obstructive pulmonary disease) ([place])   .  Diabetes mellitus type 2, controlled, without complications ([place]) 99991111  . GERD (gastroesophageal reflux disease)   . History of pituitary tumor   . Hypothyroidism   . Overactive bladder   . Prolactin deficiency [facility])    Past Surgical History:  Procedure Laterality Date  . ABDOMINAL HYSTERECTOMY     Family History  Problem Relation Age of Onset  . Hypertension Mother   . Heart attack Father   . Breast cancer Neg Hx    Social History   Socioeconomic History  . Marital status: Married    Spouse name: Not on file  . Number of children: 1  . Years of education: Not on file  . Highest education level: Not on file  Occupational History  . Occupation: Producer, television/film/video: UNEMPLOYED  Tobacco Use  . Smoking status: Never Smoker  . Smokeless tobacco: Never Used  Substance and Sexual Activity  . Alcohol use: No  . Drug use: No  . Sexual activity: Not on file  Other Topics Concern  . Not on file  Social History Narrative  . Not on file   Social Determinants of Health   Financial Resource Strain: Low Risk   . Difficulty of Paying Living Expenses: Not hard at all  Food Insecurity: No Food Insecurity  . Worried About Charity fundraiser in the Last Year: Never true  . Ran Out of Food in the Last Year: Never true  Transportation Needs: No Transportation Needs  . Lack of  Transportation (Medical): No  . Lack of Transportation (Non-Medical): No  Physical Activity: Insufficiently Active  . Days of Exercise per Week: 1 day  . Minutes of Exercise per Session: 60 min  Stress: No Stress Concern Present  . Feeling of Stress : Not at all  Social Connections:   . Frequency of Communication with Friends and Family: Not on file  . Frequency of Social Gatherings with Friends and Family: Not on file  . Attends Religious Services: Not on file  . Active Member of Clubs or Organizations: Not on file  . Attends Archivist Meetings: Not on file  . Marital Status: Not on  file    Outpatient Encounter Medications as of 12/11/2019  Medication Sig  . albuterol (VENTOLIN HFA) 108 (90 Base) MCG/ACT inhaler Inhale 2 puffs into the lungs every 6 (six) hours as needed for wheezing or shortness of breath.  [name] Kitchen amLODipine (NORVASC) 10 MG tablet TAKE 1 TABLET BY MOUTH ONCE A DAY  . atorvastatin (LIPITOR) 20 MG tablet Take 1 tablet (20 mg total) by mouth daily.  . benzonatate (TESSALON) 200 MG capsule Take 1 capsule (200 mg total) by mouth 3 (three) times daily as needed. Swallow whole, do not bite the pill  . clonazePAM (KLONOPIN) 0.5 MG tablet Take 1 tablet (0.5 mg total) by mouth 2 (two) times daily as needed for anxiety.  [name] Kitchen FLUoxetine (PROZAC) 20 MG capsule Take 1 capsule (20 mg total) by mouth daily.  [name] Kitchen guaiFENesin-codeine (CHERATUSSIN AC) 100-10 MG/5ML syrup Take 5 mLs by mouth 2 (two) times daily as needed for cough (sedation precautions).  [name] Kitchen levothyroxine (SYNTHROID) 50 MCG tablet TAKE 1 TABLET BY MOUTH ONCE A DAY. TAKE ON AN EMPTY STOMACH WITH A GLASS OF WATER ATLEAST 30-60 MIN BEFORE BREAKFAST  . losartan-hydrochlorothiazide (HYZAAR) 100-25 MG tablet TAKE 1 TABLET BY MOUTH ONCE A DAY  . metFORMIN (GLUCOPHAGE-XR) 500 MG 24 hr tablet TAKE 1 TABLET BY MOUTH ONCE A DAY WITH BREAKFAST.  . ONE TOUCH ULTRA TEST test strip USE TO CHECK BLOOD SUGAR TWICE A DAY AND AS DIRECTED  . ONETOUCH DELICA LANCETS 99991111 MISC Use to check blood sugar two times a day   No facility-administered encounter medications on file as of 12/11/2019.    Activities of Daily Living In your present state of health, do you have any difficulty performing the following activities: 12/11/2019  Hearing? N  Vision? N  Difficulty concentrating or making decisions? N  Walking or climbing stairs? N  Dressing or bathing? N  Doing errands, shopping? N  Preparing Food and eating ? N  Using the Toilet? N  In the past six months, have you accidently leaked urine? N  Do you have problems with loss of bowel  control? N  Managing your Medications? N  Managing your Finances? N  Housekeeping or managing your Housekeeping? N  Some recent data might be hidden    Patient Care Team: Owens Loffler, MD as PCP - General    Assessment:   This is a routine wellness examination for Frederika.  Exercise Activities and Dietary recommendations Current Exercise Habits: Structured exercise class, Type of exercise: yoga, Time (Minutes): 60, Frequency (Times/Week): 1, Weekly Exercise (Minutes/Week): 60, Intensity: Moderate, Exercise limited by: None identified  Goals    . Patient Stated     12/11/2019, I will continue doing yoga on Wednesdays for 1 hour.        Fall Risk Fall Risk  12/11/2019 11/19/2018  Falls in the past year?  0 0  Number falls in past yr: 0 -  Injury with Fall? 0 -  Risk for fall due to : Medication side effect -  Follow up Falls evaluation completed;Falls prevention discussed -   Is the patient's home free of loose throw rugs in walkways, pet beds, electrical cords, etc?   yes      Grab bars in the bathroom? no      Handrails on the stairs?   no      Adequate lighting?   yes  Timed Get Up and Go performed: N/A  Depression Screen PHQ 2/9 Scores 12/11/2019 11/19/2018 09/11/2017  PHQ - 2 Score 0 0 0  PHQ- 9 Score 0 - -     Cognitive Function MMSE - Mini Mental State Exam 12/11/2019  Orientation to time 5  Orientation to Place 5  Registration 3  Attention/ Calculation 5  Recall 3  Language- repeat 1       Mini Cog  Mini-Cog screen was completed. Maximum score is 22. A value of 0 denotes this part of the MMSE was not completed or the patient failed this part of the Mini-Cog screening.  Immunization History  Administered Date(s) Administered  . Fluad Quad(high Dose 65+) 07/01/2019  . Influenza Whole 07/31/2008, 07/12/2010  . Influenza, Seasonal, Injecte, Preservative Fre 07/08/2015, 07/26/2016, 08/02/2017  . Influenza,inj,Quad PF,6+ Mos 07/13/2018  . PFIZER SARS-COV-2  Vaccination 11/29/2019  . Pneumococcal Conjugate-13 08/21/2014  . Pneumococcal Polysaccharide-23 09/07/2015  . Tdap 04/01/2014    Qualifies for Shingles Vaccine: Yes  Screening Tests Health Maintenance  Topic Date Due  . FOOT EXAM  11/20/2019  . OPHTHALMOLOGY EXAM  12/10/2020 (Originally 08/08/2015)  . DEXA SCAN  12/10/2020 (Originally 12/29/2017)  . COLONOSCOPY  12/10/2020 (Originally 12/30/2002)  . MAMMOGRAM  01/03/2020  . HEMOGLOBIN A1C  06/09/2020  . PNA vac Low Risk Adult (2 of 2 - PPSV23) 09/06/2020  . TETANUS/TDAP  04/01/2024  . INFLUENZA VACCINE  Completed  . Hepatitis C Screening  Completed    Cancer Screenings: Lung: Low Dose CT Chest recommended if Age 6-80 years, 30 pack-year currently smoking OR have quit w/in 15 years. Patient does not qualify. Breast:  Up to date on Mammogram? No, postpone until after pandemic   Up to date of Bone Density/Dexa? No, postpone until after pandemic Colorectal: postpone until after pandemic  Additional Screenings:  Hepatitis C Screening: 09/05/2016     Plan:    Patient will continue to do yoga on Wednesdays for 1 hour.   I have personally reviewed and noted the following in the patient's chart:   . Medical and social history . Use of alcohol, tobacco or illicit drugs  . Current medications and supplements . Functional ability and status . Nutritional status . Physical activity . Advanced directives . List of other physicians . Hospitalizations, surgeries, and ER visits in previous 12 months . Vitals . Screenings to include cognitive, depression, and falls . Referrals and appointments  In addition, I have reviewed and discussed with patient certain preventive protocols, quality metrics, and best practice recommendations. A written personalized care plan for preventive services as well as general preventive health recommendations were provided to patient.     Andrez Grime, LPN  [id number]

## 2019-12-11 NOTE — Patient Instructions (Signed)
Robin Bass , Thank you for taking time to come for your Medicare Wellness Visit. I appreciate your ongoing commitment to your health goals. Please review the following plan we discussed and let me know if I can assist you in the future.   Screening recommendations/referrals: Colonoscopy: postpone due to pandemic Mammogram: postpone due to pandemic Bone Density: postpone due to pandemic Recommended yearly ophthalmology/optometry visit for glaucoma screening and checkup Recommended yearly dental visit for hygiene and checkup  Vaccinations: Influenza vaccine: Up to date, completed 07/01/2019 Pneumococcal vaccine: Completed series Tdap vaccine: Up to date, completed 04/01/2014 Shingles vaccine: discussed    Advanced directives: Please bring a copy of your POA (Power of [place]) and/or Living Will to your next appointment.  Conditions/risks identified: diabetes, hypertension  Next appointment: 12/16/2019 @ 2:20 pm    Preventive Care 65 Years and Older, Female Preventive care refers to lifestyle choices and visits with your health care provider that can promote health and wellness. What does preventive care include?  A yearly physical exam. This is also called an annual well check.  Dental exams once or twice a year.  Routine eye exams. Ask your health care provider how often you should have your eyes checked.  Personal lifestyle choices, including:  Daily care of your teeth and gums.  Regular physical activity.  Eating a healthy diet.  Avoiding tobacco and drug use.  Limiting alcohol use.  Practicing safe sex.  Taking low-dose aspirin every day.  Taking vitamin and mineral supplements as recommended by your health care provider. What happens during an annual well check? The services and screenings done by your health care provider during your annual well check will depend on your age, overall health, lifestyle risk factors, and family history of disease. Counseling  Your  health care provider may ask you questions about your:  Alcohol use.  Tobacco use.  Drug use.  Emotional well-being.  Home and relationship well-being.  Sexual activity.  Eating habits.  History of falls.  Memory and ability to understand (cognition).  Work and work Statistician.  Reproductive health. Screening  You may have the following tests or measurements:  Height, weight, and BMI.  Blood pressure.  Lipid and cholesterol levels. These may be checked every 5 years, or more frequently if you are over 23 years old.  Skin check.  Lung cancer screening. You may have this screening every year starting at age 25 if you have a 30-pack-year history of smoking and currently smoke or have quit within the past 15 years.  Fecal occult blood test (FOBT) of the stool. You may have this test every year starting at age 72.  Flexible sigmoidoscopy or colonoscopy. You may have a sigmoidoscopy every 5 years or a colonoscopy every 10 years starting at age 44.  Hepatitis C blood test.  Hepatitis B blood test.  Sexually transmitted disease (STD) testing.  Diabetes screening. This is done by checking your blood sugar (glucose) after you have not eaten for a while (fasting). You may have this done every 1-3 years.  Bone density scan. This is done to screen for osteoporosis. You may have this done starting at age 44.  Mammogram. This may be done every 1-2 years. Talk to your health care provider about how often you should have regular mammograms. Talk with your health care provider about your test results, treatment options, and if necessary, the need for more tests. Vaccines  Your health care provider may recommend certain vaccines, such as:  Influenza vaccine. This  is recommended every year.  Tetanus, diphtheria, and acellular pertussis (Tdap, Td) vaccine. You may need a Td booster every 10 years.  Zoster vaccine. You may need this after age 87.  Pneumococcal 13-valent  conjugate (PCV13) vaccine. One dose is recommended after age 43.  Pneumococcal polysaccharide (PPSV23) vaccine. One dose is recommended after age 80. Talk to your health care provider about which screenings and vaccines you need and how often you need them. This information is not intended to replace advice given to you by your health care provider. Make sure you discuss any questions you have with your health care provider. Document Released: 11/06/2015 Document Revised: 06/29/2016 Document Reviewed: 08/11/2015 Elsevier Interactive Patient Education  2017 [place] Prevention in the Home Falls can cause injuries. They can happen to people of all ages. There are many things you can do to make your home safe and to help prevent falls. What can I do on the outside of my home?  Regularly fix the edges of walkways and driveways and fix any cracks.  Remove anything that might make you trip as you walk through a door, such as a raised step or threshold.  Trim any bushes or trees on the path to your home.  Use bright outdoor lighting.  Clear any walking paths of anything that might make someone trip, such as rocks or tools.  Regularly check to see if handrails are loose or broken. Make sure that both sides of any steps have handrails.  Any raised decks and porches should have guardrails on the edges.  Have any leaves, snow, or ice cleared regularly.  Use sand or salt on walking paths during winter.  Clean up any spills in your garage right away. This includes oil or grease spills. What can I do in the bathroom?  Use night lights.  Install grab bars by the toilet and in the tub and shower. Do not use towel bars as grab bars.  Use non-skid mats or decals in the tub or shower.  If you need to sit down in the shower, use a plastic, non-slip stool.  Keep the floor dry. Clean up any water that spills on the floor as soon as it happens.  Remove soap buildup in the tub or  shower regularly.  Attach bath mats securely with double-sided non-slip rug tape.  Do not have throw rugs and other things on the floor that can make you trip. What can I do in the bedroom?  Use night lights.  Make sure that you have a light by your bed that is easy to reach.  Do not use any sheets or blankets that are too big for your bed. They should not hang down onto the floor.  Have a firm chair that has side arms. You can use this for support while you get dressed.  Do not have throw rugs and other things on the floor that can make you trip. What can I do in the kitchen?  Clean up any spills right away.  Avoid walking on wet floors.  Keep items that you use a lot in easy-to-reach places.  If you need to reach something above you, use a strong step stool that has a grab bar.  Keep electrical cords out of the way.  Do not use floor polish or wax that makes floors slippery. If you must use wax, use non-skid floor wax.  Do not have throw rugs and other things on the floor that can make you  trip. What can I do with my stairs?  Do not leave any items on the stairs.  Make sure that there are handrails on both sides of the stairs and use them. Fix handrails that are broken or loose. Make sure that handrails are as long as the stairways.  Check any carpeting to make sure that it is firmly attached to the stairs. Fix any carpet that is loose or worn.  Avoid having throw rugs at the top or bottom of the stairs. If you do have throw rugs, attach them to the floor with carpet tape.  Make sure that you have a light switch at the top of the stairs and the bottom of the stairs. If you do not have them, ask someone to add them for you. What else can I do to help prevent falls?  Wear shoes that:  Do not have high heels.  Have rubber bottoms.  Are comfortable and fit you well.  Are closed at the toe. Do not wear sandals.  If you use a stepladder:  Make sure that it is fully  opened. Do not climb a closed stepladder.  Make sure that both sides of the stepladder are locked into place.  Ask someone to hold it for you, if possible.  Clearly mark and make sure that you can see:  Any grab bars or handrails.  First and last steps.  Where the edge of each step is.  Use tools that help you move around (mobility aids) if they are needed. These include:  Canes.  Walkers.  Scooters.  Crutches.  Turn on the lights when you go into a dark area. Replace any light bulbs as soon as they burn out.  Set up your furniture so you have a clear path. Avoid moving your furniture around.  If any of your floors are uneven, fix them.  If there are any pets around you, be aware of where they are.  Review your medicines with your doctor. Some medicines can make you feel dizzy. This can increase your chance of falling. Ask your doctor what other things that you can do to help prevent falls. This information is not intended to replace advice given to you by your health care provider. Make sure you discuss any questions you have with your health care provider. Document Released: 08/06/2009 Document Revised: 03/17/2016 Document Reviewed: 11/14/2014 Elsevier Interactive Patient Education  2017 [employer].

## 2019-12-16 ENCOUNTER — Encounter: Payer: Self-pay | Admitting: Family Medicine

## 2019-12-16 ENCOUNTER — Ambulatory Visit (INDEPENDENT_AMBULATORY_CARE_PROVIDER_SITE_OTHER): Payer: Medicare Other | Admitting: Family Medicine

## 2019-12-16 ENCOUNTER — Other Ambulatory Visit: Payer: Self-pay

## 2019-12-16 VITALS — BP 132/70 | HR 91 | Temp 98.1°F | Ht 59.5 in | Wt 177.5 lb

## 2019-12-16 DIAGNOSIS — E038 Other specified hypothyroidism: Secondary | ICD-10-CM

## 2019-12-16 DIAGNOSIS — E559 Vitamin D deficiency, unspecified: Secondary | ICD-10-CM

## 2019-12-16 DIAGNOSIS — Z0000 Encounter for general adult medical examination without abnormal findings: Secondary | ICD-10-CM

## 2019-12-16 DIAGNOSIS — E119 Type 2 diabetes mellitus without complications: Secondary | ICD-10-CM

## 2019-12-16 MED ORDER — VITAMIN D (ERGOCALCIFEROL) 1.25 MG (50000 UNIT) PO CAPS: 50000.0000 [IU] | ORAL_CAPSULE | ORAL | 3 refills | Status: DC

## 2019-12-16 MED ORDER — METFORMIN HCL ER 500 MG PO TB24: 1000.0000 mg | ORAL_TABLET | Freq: Every day | ORAL | 3 refills | Status: DC

## 2019-12-16 NOTE — Progress Notes (Signed)
Robin Cretella T. Shakeda Pearse, MD Primary Care and [place] at [facility] [place], [address] Phone: [phone]  FAX: [phone]  Robin Bass - 69 y.o. female  MRN [id number]  Date of Birth: 12-20-1950  Visit Date: 12/16/2019  PCP: Owens Loffler, MD  Referred by: Owens Loffler, MD  Chief Complaint  Patient presents with  . Annual Exam    Part 2    This visit occurred during the SARS-CoV-2 public health emergency.  Safety protocols were in place, including screening questions prior to the visit, additional usage of staff PPE, and extensive cleaning of exam room while observing appropriate contact time as indicated for disinfecting solutions.   Patient Care Team: Owens Loffler, MD as PCP - General Subjective:   Robin Bass is a 69 y.o. pleasant patient who presents with the following:  Health Maintenance Summary Reviewed and updated, unless pt declines services.  Tobacco History Reviewed. Non-smoker Alcohol: No concerns, no excessive use Exercise Habits: Some activity, rec at least 30 mins 5 times a week STD concerns: none Drug Use: None Lumps or breast concerns: no  Check her finger.  She has a trigger finger on the right third digit.  This is bothersome, but not acutely painful Trigger finger. R 3rd.   Her vitamin D level is only at 11, with the edge of normal being 30.  She currently is not taking any oral vitamin D  Pneumonia.  She needs this, but she recently had her COVID-19 vaccine  Lab Results  Component Value Date   HGBA1C 7.9 (H) 12/11/2019    Her blood sugar has increased some compared to her last office visit.  At this point she is only taking Metformin XR, 500 mg daily.  Previously, her A1c has always been less than 7 Diet is not so great.   Filed Weights   12/16/19 1417  Weight: 177 lb 8 oz (80.5 kg)    Wt Readings from Last 3 Encounters:  12/16/19 177 lb 8 oz  (80.5 kg)  12/11/19 175 lb (79.4 kg)  12/02/19 175 lb (79.4 kg)    She has a normal free T3 and free T4, but a modestly elevated TSH.  She currently is on Synthroid 50 mcg daily.  Lab Results  Component Value Date   TSH 5.06 (H) 12/11/2019   T4TOTAL 10.8 07/31/2008      Health Maintenance  Topic Date Due  . FOOT EXAM  11/20/2019  . OPHTHALMOLOGY EXAM  12/10/2020 (Originally 08/08/2015)  . DEXA SCAN  12/10/2020 (Originally 12/29/2017)  . COLONOSCOPY  12/10/2020 (Originally 12/30/2002)  . MAMMOGRAM  01/03/2020  . HEMOGLOBIN A1C  06/09/2020  . PNA vac Low Risk Adult (2 of 2 - PPSV23) 09/06/2020  . TETANUS/TDAP  04/01/2024  . INFLUENZA VACCINE  Completed  . Hepatitis C Screening  Completed    Immunization History  Administered Date(s) Administered  . Fluad Quad(high Dose 65+) 07/01/2019  . Influenza Whole 07/31/2008, 07/12/2010  . Influenza, Seasonal, Injecte, Preservative Fre 07/08/2015, 07/26/2016, 08/02/2017  . Influenza,inj,Quad PF,6+ Mos 07/13/2018  . PFIZER SARS-COV-2 Vaccination 11/29/2019  . Pneumococcal Conjugate-13 08/21/2014  . Pneumococcal Polysaccharide-23 09/07/2015  . Tdap 04/01/2014   Patient Active Problem List   Diagnosis Date Noted  . Diabetes mellitus type 2, controlled, without complications ([place]) 17/91/5056    Priority: High  . Hypothyroidism 07/31/2008    Priority: Medium  . Essential hypertension 07/31/2008    Priority: Medium  . Hyperlipidemia  LDL goal <70 09/07/2015  . Depression 03/31/2012  . Overactive bladder 02/05/2011  . 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 ALLERGIC RHINITIS 07/31/2008  . ASTHMA 07/31/2008  . COPD 07/31/2008  . GERD 07/31/2008    Past Medical History:  Diagnosis Date  . Allergy   . Asthma   . Cancer ([place])    skin  . COPD (chronic obstructive pulmonary disease) ([place])   . Diabetes mellitus type 2, controlled, without complications ([place]) 78/67/6720  . GERD (gastroesophageal reflux disease)   . History of pituitary  tumor   . Hypothyroidism   . Overactive bladder   . Prolactin deficiency [facility])     Past Surgical History:  Procedure Laterality Date  . ABDOMINAL HYSTERECTOMY      Family History  Problem Relation Age of Onset  . Hypertension Mother   . Heart attack Father   . Breast cancer Neg Hx     Past Medical History, Surgical History, Social History, Family History, Problem List, Medications, and Allergies have been reviewed and updated if relevant.  Review of Systems: Pertinent positives are listed above.  Otherwise, a full 14 point review of systems has been done in full and it is negative except where it is noted positive.  Objective:   BP 132/70   Pulse 91   Temp 98.1 F (36.7 C) (Temporal)   Ht 4' 11.5" (1.511 m)   Wt 177 lb 8 oz (80.5 kg)   SpO2 94%   BMI 35.25 kg/m  Ideal Body Weight: Weight in (lb) to have BMI = 25: 125.6 No exam data present Depression screen [facility] 2/9 12/11/2019 11/19/2018 09/11/2017  Decreased Interest 0 0 0  Down, Depressed, Hopeless 0 0 0  PHQ - 2 Score 0 0 0  Altered sleeping 0 - -  Tired, decreased energy 0 - -  Change in appetite 0 - -  Feeling bad or failure about yourself  0 - -  Trouble concentrating 0 - -  Moving slowly or fidgety/restless 0 - -  Suicidal thoughts 0 - -  PHQ-9 Score 0 - -  Difficult doing work/chores Not difficult at all - -     GEN: well developed, well nourished, no acute distress Eyes: conjunctiva and lids normal, PERRLA, EOMI ENT: TM clear, nares clear, oral exam WNL Neck: supple, no lymphadenopathy, no thyromegaly, no JVD Pulm: clear to auscultation and percussion, respiratory effort normal CV: regular rate and rhythm, S1-S2, no murmur, rub or gallop, no bruits Chest: no scars, masses, no lumps BREAST: breast exam declined GI: soft, non-tender; no hepatosplenomegaly, masses; active bowel sounds all quadrants GU: GU exam declined Lymph: no cervical, axillary or inguinal adenopathy MSK: gait normal, muscle tone  and strength WNL, no joint swelling, effusions, discoloration, crepitus. Obvious trigger finger, 3rd, on R SKIN: clear, good turgor, color WNL, no rashes, lesions, or ulcerations Neuro: normal mental status, normal strength, sensation, and motion Psych: alert; oriented to person, place and time, normally interactive and not anxious or depressed in appearance.  Lab Review:  CBC EXTENDED Latest Ref Rng & Units 12/11/2019 11/15/2018 09/11/2017  WBC 4.0 - 10.5 K/uL 8.1 6.6 6.7  RBC 3.87 - 5.11 Mil/uL 4.95 4.94 5.13(H)  HGB 12.0 - 15.0 g/dL 12.6 12.7 12.9  HCT 36.0 - 46.0 % 38.2 37.5 39.5  PLT 150.0 - 400.0 K/uL 352.0 329.0 357.0  NEUTROABS 1.4 - 7.7 K/uL 5.1 4.0 4.2  LYMPHSABS 0.7 - 4.0 K/uL 2.4 1.9 2.0    BMP Latest Ref Rng & Units  12/11/2019 09/11/2017 09/05/2016  Glucose 70 - 99 mg/dL 133(H) 121(H) 130(H)  BUN 6 - 23 mg/dL 17 22 17   Creatinine 0.40 - 1.20 mg/dL 0.75 0.73 0.65  Sodium 135 - 145 mEq/L 141 138 142  Potassium 3.5 - 5.1 mEq/L 3.7 3.8 3.4(L)  Chloride 96 - 112 mEq/L 101 100 103  CO2 19 - 32 mEq/L 33(H) 31 30  Calcium 8.4 - 10.5 mg/dL 9.6 10.0 9.6    Hepatic Function Latest Ref Rng & Units 12/11/2019 09/11/2017 09/05/2016  Total Protein 6.0 - 8.3 g/dL 7.0 7.5 7.3  Albumin 3.5 - 5.2 g/dL 4.2 4.3 4.1  AST 0 - 37 U/L 13 14 13   ALT 0 - 35 U/L 13 11 13   Alk Phosphatase 39 - 117 U/L 114 102 114  Total Bilirubin 0.2 - 1.2 mg/dL 0.5 0.5 0.4  Bilirubin, Direct 0.0 - 0.3 mg/dL 0.1 0.0 0.1    Lab Results  Component Value Date   CHOL 160 12/11/2019   Lab Results  Component Value Date   HDL 55.80 12/11/2019   Lab Results  Component Value Date   LDLCALC 80 12/11/2019   Lab Results  Component Value Date   TRIG 124.0 12/11/2019   Lab Results  Component Value Date   CHOLHDL 3 12/11/2019   No results for input(s): PSA in the last 72 hours. Lab Results  Component Value Date   HCVAB NEGATIVE 09/05/2016   Lab Results  Component Value Date   VD25OH 10.99 (L) 12/11/2019      Lab Results  Component Value Date   HGBA1C 7.9 (H) 12/11/2019   HGBA1C 6.9 (H) 11/15/2018   HGBA1C 6.7 (H) 09/11/2017   Lab Results  Component Value Date   MICROALBUR 1.8 12/11/2019   LDLCALC 80 12/11/2019   CREATININE 0.75 12/11/2019      Assessment and Plan:     ICD-10-CM   1. Healthcare maintenance  Z00.00   2. Vitamin D deficiency  E55.9   3. Other specified hypothyroidism  E03.8   4. Controlled type 2 diabetes mellitus without complication, without long-term current use of insulin (HCC)  E11.9    Per below, increase Metformin dosing, add high-dose vitamin D protocol.  Currently with subclinical hypothyroidism with Synthroid 50 mcg.  Recheck vitamin D and thyroid labs at the time of her office visit in 3 months.  At that time we can also do a trigger finger injection.  Health Maintenance Exam: The patient's preventative maintenance and recommended screening tests for an annual wellness exam were reviewed in full today. Brought up to date unless services declined.  Counselled on the importance of diet, exercise, and its role in overall health and mortality. The patient's FH and SH was reviewed, including their home life, tobacco status, and drug and alcohol status.  Follow-up in 1 year for physical exam or additional follow-up below.  Follow-up: Return in about 3 months (around 03/14/2020) for follow-up diabetes, thyroid, and vitamin d levels. Or follow-up in 1 year if not noted.  Future Appointments  Date Time Provider [place]  12/24/2019  4:30 PM [place] [facility]  03/16/2020  2:20 PM Astrid Vides, Frederico Hamman, MD [facility]    Meds ordered this encounter  Medications  . Vitamin D, Ergocalciferol, (DRISDOL) 1.25 MG (50000 UNIT) CAPS capsule    Sig: Take 1 capsule (50,000 Units total) by mouth every 7 (seven) days.    Dispense:  5 capsule    Refill:  3  . metFORMIN (GLUCOPHAGE  XR) 500 MG 24 hr tablet    Sig: Take 2 tablets (1,000  mg total) by mouth daily with breakfast.    Dispense:  180 tablet    Refill:  3   Medications Discontinued During This Encounter  Medication Reason  . metFORMIN (GLUCOPHAGE-XR) 500 MG 24 hr tablet    No orders of the defined types were placed in this encounter.   Signed,  [name]. Pruitt Taboada, MD   Allergies as of 12/16/2019      Reactions   Sulfonamide Derivatives    Ace Inhibitors Cough      Medication List       Accurate as of December 16, 2019 11:59 PM. If you have any questions, ask your nurse or doctor.        albuterol 108 (90 Base) MCG/ACT inhaler Commonly known as: VENTOLIN HFA Inhale 2 puffs into the lungs every 6 (six) hours as needed for wheezing or shortness of breath.   amLODipine 10 MG tablet Commonly known as: NORVASC TAKE 1 TABLET BY MOUTH ONCE A DAY   atorvastatin 20 MG tablet Commonly known as: LIPITOR Take 1 tablet (20 mg total) by mouth daily.   benzonatate 200 MG capsule Commonly known as: TESSALON Take 1 capsule (200 mg total) by mouth 3 (three) times daily as needed. Swallow whole, do not bite the pill   clonazePAM 0.5 MG tablet Commonly known as: KLONOPIN Take 1 tablet (0.5 mg total) by mouth 2 (two) times daily as needed for anxiety.   FLUoxetine 20 MG capsule Commonly known as: PROZAC Take 1 capsule (20 mg total) by mouth daily.   guaiFENesin-codeine 100-10 MG/5ML syrup Commonly known as: Cheratussin AC Take 5 mLs by mouth 2 (two) times daily as needed for cough (sedation precautions).   levothyroxine 50 MCG tablet Commonly known as: SYNTHROID TAKE 1 TABLET BY MOUTH ONCE A DAY. TAKE ON AN EMPTY STOMACH WITH A GLASS OF WATER ATLEAST 30-60 MIN BEFORE BREAKFAST   losartan-hydrochlorothiazide 100-25 MG tablet Commonly known as: HYZAAR TAKE 1 TABLET BY MOUTH ONCE A DAY   metFORMIN 500 MG 24 hr tablet Commonly known as: Glucophage XR Take 2 tablets (1,000 mg total) by mouth daily with breakfast. What changed:   how much to  take  how to take this  when to take this  additional instructions Changed by: Owens Loffler, MD   ONE TOUCH ULTRA TEST test strip Generic drug: glucose blood USE TO CHECK BLOOD SUGAR TWICE A DAY AND AS DIRECTED   OneTouch Delica Lancets 37D Misc Use to check blood sugar two times a day   Vitamin D (Ergocalciferol) 1.25 MG (50000 UNIT) Caps capsule Commonly known as: DRISDOL Take 1 capsule (50,000 Units total) by mouth every 7 (seven) days. Started by: Owens Loffler, MD

## 2019-12-16 NOTE — Patient Instructions (Signed)
Recheck vitamin d levels in 4 months

## 2019-12-24 ENCOUNTER — Ambulatory Visit: Payer: Medicare Other | Attending: Internal Medicine

## 2019-12-24 DIAGNOSIS — Z23 Encounter for immunization: Secondary | ICD-10-CM

## 2019-12-24 NOTE — Progress Notes (Signed)
   Covid-19 Vaccination Clinic  Name:  Robin Bass    MRN: [id number] DOB: 1951/05/30  12/24/2019  Ms. Robin Bass was observed post Covid-19 immunization for 15 minutes without incident. She was provided with Vaccine Information Sheet and instruction to access the V-Safe system.   Ms. Robin Bass was instructed to call 911 with any severe reactions post vaccine: [name] Kitchen Difficulty breathing  . Swelling of face and throat  . A fast heartbeat  . A bad rash all over body  . Dizziness and weakness   Immunizations Administered    Name Date Dose VIS Date Route   Pfizer COVID-19 Vaccine 12/24/2019  4:08 PM 0.3 mL 10/04/2019 Intramuscular   Manufacturer: [place]   Lot: [id number]   [place]: [id number]

## 2020-01-20 ENCOUNTER — Other Ambulatory Visit: Payer: Self-pay | Admitting: *Deleted

## 2020-01-20 MED ORDER — LOSARTAN POTASSIUM-HCTZ 100-25 MG PO TABS: 1.0000 | ORAL_TABLET | Freq: Every day | ORAL | 1 refills | Status: DC

## 2020-02-17 ENCOUNTER — Other Ambulatory Visit: Payer: Self-pay | Admitting: Family Medicine

## 2020-03-02 ENCOUNTER — Other Ambulatory Visit: Payer: Self-pay

## 2020-03-02 ENCOUNTER — Ambulatory Visit
Admission: EM | Admit: 2020-03-02 | Discharge: 2020-03-02 | Disposition: A | Discharge status: Home / Self Care | Payer: Medicare Other | Attending: Family Medicine | Admitting: Family Medicine

## 2020-03-02 ENCOUNTER — Telehealth: Payer: Self-pay | Admitting: Family Medicine

## 2020-03-02 DIAGNOSIS — H6692 Otitis media, unspecified, left ear: Secondary | ICD-10-CM

## 2020-03-02 DIAGNOSIS — R519 Headache, unspecified: Secondary | ICD-10-CM

## 2020-03-02 MED ORDER — AMOXICILLIN 500 MG PO TABS: 500.0000 mg | ORAL_TABLET | Freq: Two times a day (BID) | ORAL | 0 refills | Status: AC

## 2020-03-02 NOTE — ED Provider Notes (Signed)
[place]   [id number] 03/02/20 Arrival Time: 1000  CC: EAR PAIN  SUBJECTIVE: History from: patient.  Robin Bass is a 69 y.o. female who presents with of left ear pain for the past week.  Denies a precipitating event, such as swimming or wearing ear plugs.  Patient states the pain is constant and achy in character.  Patient has tried ibuprofen with temporary relief.  Symptoms are made worse with lying down.  Reports similar symptoms in the past that improved with antibiotic treatment.    Denies fever, chills, fatigue, sinus pain, rhinorrhea, ear discharge, sore throat, SOB, wheezing, chest pain, nausea, changes in bowel or bladder habits.    ROS: As per HPI.  All other pertinent ROS negative.     Past Medical History:  Diagnosis Date  . Allergy   . Asthma   . Cancer ([place])    skin  . COPD (chronic obstructive pulmonary disease) ([place])   . Diabetes mellitus type 2, controlled, without complications ([place]) 99991111  . GERD (gastroesophageal reflux disease)   . History of pituitary tumor   . Hypothyroidism   . Overactive bladder   . Prolactin deficiency [facility])    Past Surgical History:  Procedure Laterality Date  . ABDOMINAL HYSTERECTOMY     Allergies  Allergen Reactions  . Sulfonamide Derivatives   . Ace Inhibitors Cough   No current facility-administered medications on file prior to encounter.   Current Outpatient Medications on File Prior to Encounter  Medication Sig Dispense Refill  . albuterol (VENTOLIN HFA) 108 (90 Base) MCG/ACT inhaler Inhale 2 puffs into the lungs every 6 (six) hours as needed for wheezing or shortness of breath. 1 Inhaler 2  . amLODipine (NORVASC) 10 MG tablet TAKE 1 TABLET BY MOUTH ONCE A DAY 90 tablet 0  . atorvastatin (LIPITOR) 20 MG tablet TAKE 1 TABLET BY MOUTH ONCE DAILY 90 tablet 3  . FLUoxetine (PROZAC) 20 MG capsule Take 1 capsule (20 mg total) by mouth daily. 90 capsule 0  . levothyroxine (SYNTHROID) 50 MCG tablet  TAKE 1 TABLET BY MOUTH ONCE A DAY. TAKE ON AN EMPTY STOMACH WITH A GLASS OF WATER ATLEAST 30-60 MIN BEFORE BREAKFAST 90 tablet 0  . losartan-hydrochlorothiazide (HYZAAR) 100-25 MG tablet Take 1 tablet by mouth daily. 90 tablet 1  . metFORMIN (GLUCOPHAGE XR) 500 MG 24 hr tablet Take 2 tablets (1,000 mg total) by mouth daily with breakfast. 180 tablet 3  . ONE TOUCH ULTRA TEST test strip USE TO CHECK BLOOD SUGAR TWICE A DAY AND AS DIRECTED 100 each 6  . ONETOUCH DELICA LANCETS 99991111 MISC Use to check blood sugar two times a day 100 each 11  . Vitamin D, Ergocalciferol, (DRISDOL) 1.25 MG (50000 UNIT) CAPS capsule Take 1 capsule (50,000 Units total) by mouth every 7 (seven) days. 5 capsule 3  . benzonatate (TESSALON) 200 MG capsule Take 1 capsule (200 mg total) by mouth 3 (three) times daily as needed. Swallow whole, do not bite the pill 30 capsule 1  . clonazePAM (KLONOPIN) 0.5 MG tablet Take 1 tablet (0.5 mg total) by mouth 2 (two) times daily as needed for anxiety. 30 tablet 1  . guaiFENesin-codeine (CHERATUSSIN AC) 100-10 MG/5ML syrup Take 5 mLs by mouth 2 (two) times daily as needed for cough (sedation precautions). 120 mL 0   Social History   Socioeconomic History  . Marital status: Married    Spouse name: Not on file  . Number of children: 1  . Years  of education: Not on file  . Highest education level: Not on file  Occupational History  . Occupation: Producer, television/film/video: UNEMPLOYED  Tobacco Use  . Smoking status: Never Smoker  . Smokeless tobacco: Never Used  Substance and Sexual Activity  . Alcohol use: No  . Drug use: No  . Sexual activity: Not on file  Other Topics Concern  . Not on file  Social History Narrative  . Not on file   Social Determinants of Health   Financial Resource Strain: Low Risk   . Difficulty of Paying Living Expenses: Not hard at all  Food Insecurity: No Food Insecurity  . Worried About Charity fundraiser in the Last Year: Never true  . Ran Out of  Food in the Last Year: Never true  Transportation Needs: No Transportation Needs  . Lack of Transportation (Medical): No  . Lack of Transportation (Non-Medical): No  Physical Activity: Insufficiently Active  . Days of Exercise per Week: 1 day  . Minutes of Exercise per Session: 60 min  Stress: No Stress Concern Present  . Feeling of Stress : Not at all  Social Connections:   . Frequency of Communication with Friends and Family:   . Frequency of Social Gatherings with Friends and Family:   . Attends Religious Services:   . Active Member of Clubs or Organizations:   . Attends Archivist Meetings:   [name] Kitchen Marital Status:   Intimate Partner Violence: Not At Risk  . Fear of Current or Ex-Partner: No  . Emotionally Abused: No  . Physically Abused: No  . Sexually Abused: No   Family History  Problem Relation Age of Onset  . Hypertension Mother   . Heart attack Father   . Breast cancer Neg Hx     OBJECTIVE:  Vitals:   03/02/20 1003  BP: 137/80  Pulse: 85  Resp: 18  Temp: 98.4 F (36.9 C)  TempSrc: Oral  SpO2: 96%  Weight: 175 lb (79.4 kg)  Height: 5\' 1"  (1.549 m)     General appearance: alert; appears fatigued HEENT: Ears: EACs clear, R TMs erythematous, no bulging, no effusion; L TM erythematous, bulging, with effusion. Eyes: PERRL, EOMI grossly; Sinuses nontender to palpation; Nose: clear rhinorrhea; Throat: oropharynx mildly erythematous, tonsils 1+ without white tonsillar exudates, uvula midline Neck: supple without LAD Lungs: unlabored respirations, symmetrical air entry;  no respiratory distress Heart: regular rate and rhythm.  Radial pulses 2+ symmetrical bilaterally Skin: warm and dry Psychological: alert and cooperative; normal mood and affect  Imaging: No results found.   ASSESSMENT & PLAN:  1. Left facial pain   2. Left otitis media, unspecified otitis media type     Meds ordered this encounter  Medications  . amoxicillin (AMOXIL) 500 MG tablet     Sig: Take 1 tablet (500 mg total) by mouth 2 (two) times daily for 10 days.    Dispense:  20 tablet    Refill:  0    Order Specific Question:   Supervising Provider    Answer:   Chase Picket [id number]    Rest and drink plenty of fluids Prescribed augmentin 500mg  BID x 10 days Take medications as directed and to completion Continue to use OTC ibuprofen and/ or tylenol as needed for pain control Follow up with PCP if symptoms persists Return here or go to the ER if you have any new or worsening symptoms   Reviewed expectations re: course of current medical issues. Questions  answered. Outlined signs and symptoms indicating need for more acute intervention. Patient verbalized understanding. After Visit Summary given.          Faustino Congress, NP 03/02/20 1023

## 2020-03-02 NOTE — Telephone Encounter (Signed)
FYI   Patient called today. She stated that for a week now she has pain on the left side of her face. Patient is unsure if it is from her ear, sinuses or a tooth. Patient stated she is having a lot of congestion. She has tried to take zyrtec and it has not helped with the congestion.  Spoke with Triage about situation and with her having a lot of congestion we advised she needed to go to [facility] so she can have a face to face visit and they can look into her ear and at tooth if that is what is causing the face pain, Explained to patient office policies on patient's coming in  Patient voiced understanding,

## 2020-03-02 NOTE — Telephone Encounter (Signed)
Pt's husband called concerned about [place] sending patient to UC instead of seeing her. I explained that normally we would still be able to see her virtually but due to the face pain, we would be unable to provide good patient care since we would be unable to check her ears virtually. He states they have had their vaccines and I told him I understand where he is coming from, but even with the vaccine, there is still a chance of catching covid so we have to take precautions to keep everyone safe. He verbalized understanding.

## 2020-03-02 NOTE — Discharge Instructions (Addendum)
You have a left ear infection.  I have sent amoxicillin to your pharmacy for you to take twice daily for 10 days.  If you are not improving over the next 2 days, follow up with this office for further evaluation.  Go to the ER for trouble swallowing, trouble breathing, other concerning symptoms.

## 2020-03-02 NOTE — ED Triage Notes (Signed)
Patient complains of left side of face pain that started 1 week ago. States that she is unsure if it is her ear that is hurting or a bad tooth. States that she tried to eat a spoonful of frosty and her tooth hurt. Reports that pain has been constant.

## 2020-03-06 ENCOUNTER — Other Ambulatory Visit: Payer: Self-pay | Admitting: Family Medicine

## 2020-03-16 ENCOUNTER — Ambulatory Visit: Payer: Medicare Other | Admitting: Family Medicine

## 2020-03-25 ENCOUNTER — Telehealth: Payer: Self-pay | Admitting: Family Medicine

## 2020-03-25 NOTE — Progress Notes (Signed)
  Chronic Care Management   Note  03/25/2020 Name: Eureka Wuollet MRN: [id number] DOB: [birth year]  Merry Lofty Glickstein is a 69 y.o. year old female who is a primary care patient of Copland, Frederico Hamman, MD. I reached out to Nilda Riggs by phone today in response to a referral sent by Ms. Ivin Booty Elaine-Hummel Bangert's PCP, Owens Loffler, MD.   Ms. Renna was given information about Chronic Care Management services today including:  1. CCM service includes personalized support from designated clinical staff supervised by her physician, including individualized plan of care and coordination with other care providers 2. 24/7 contact phone numbers for assistance for urgent and routine care needs. 3. Service will only be billed when office clinical staff spend 20 minutes or more in a month to coordinate care. 4. Only one practitioner may furnish and bill the service in a calendar month. 5. The patient may stop CCM services at any time (effective at the end of the month) by phone call to the office staff.   Patient agreed to services and verbal consent obtained.   This note is not being shared with the patient for the following reason: To respect privacy (The patient or proxy has requested that the information not be shared).  Follow up plan:   Earney Hamburg Upstream Scheduler

## 2020-03-26 ENCOUNTER — Encounter: Payer: Self-pay | Admitting: Family Medicine

## 2020-03-26 ENCOUNTER — Other Ambulatory Visit: Payer: Self-pay

## 2020-03-26 ENCOUNTER — Ambulatory Visit (INDEPENDENT_AMBULATORY_CARE_PROVIDER_SITE_OTHER): Payer: Medicare Other | Admitting: Family Medicine

## 2020-03-26 VITALS — BP 112/60 | HR 85 | Temp 97.8°F | Ht 59.5 in | Wt 176.4 lb

## 2020-03-26 DIAGNOSIS — M65321 Trigger finger, right index finger: Secondary | ICD-10-CM | POA: Diagnosis not present

## 2020-03-26 DIAGNOSIS — E119 Type 2 diabetes mellitus without complications: Secondary | ICD-10-CM

## 2020-03-26 DIAGNOSIS — E038 Other specified hypothyroidism: Secondary | ICD-10-CM | POA: Diagnosis not present

## 2020-03-26 DIAGNOSIS — E559 Vitamin D deficiency, unspecified: Secondary | ICD-10-CM

## 2020-03-26 LAB — POCT GLYCOSYLATED HEMOGLOBIN (HGB A1C): Hemoglobin A1C: 6.9 % — AB (ref 4.0–5.6)

## 2020-03-26 MED ORDER — METHYLPREDNISOLONE ACETATE 40 MG/ML IJ SUSP
20.0000 mg | Freq: Once | INTRAMUSCULAR | Status: AC
  Administered 2020-03-26: 20 mg via INTRA_ARTICULAR

## 2020-03-26 NOTE — Progress Notes (Signed)
Robin Noa T. Clarrisa Kaylor, MD, [place] at [facility] [place], [address]  Phone: [phone]   FAX: [phone]  Robin Bass - 69 y.o. female   MRN [id number]   Date of Birth: [birth year]  Date: 03/26/2020   PCP: Owens Loffler, MD   Referral: Owens Loffler, MD  Chief Complaint  Patient presents with   Diabetes   Hypothyroidism   Vitamin D Deficiency   Trigger Finger    This visit occurred during the SARS-CoV-2 public health emergency.  Safety protocols were in place, including screening questions prior to the visit, additional usage of staff PPE, and extensive cleaning of exam room while observing appropriate contact time as indicated for disinfecting solutions.   Subjective:   Robin Bass is a 69 y.o. very pleasant female patient with Body mass index is 35.03 kg/m. who presents with the following:  F/u multiple med probs as above  Diabetes Mellitus: Tolerating Medications: yes Compliance with diet: Improved, Body mass index is 35.03 kg/m. Exercise: minimal / intermittent Avg blood sugars at home: not checking Foot problems: none Hypoglycemia: none No nausea, vomitting, blurred vision, polyuria.  Lab Results  Component Value Date   HGBA1C 6.9 (A) 03/26/2020   HGBA1C 7.9 (H) 12/11/2019   HGBA1C 6.9 (H) 11/15/2018   Lab Results  Component Value Date   MICROALBUR 1.8 12/11/2019   LDLCALC 80 12/11/2019   CREATININE 0.75 12/11/2019    Wt Readings from Last 3 Encounters:  03/26/20 176 lb 6 oz (80 kg)  03/02/20 175 lb (79.4 kg)  12/16/19 177 lb 8 oz (80.5 kg)     Thyroid: No symptoms. Labs reviewed. Denies cold / heat intolerance, dry skin, hair loss. No goiter. I treated her and started her on some thyroid medication for subclinical hypothyroidism.  Lab Results  Component Value Date   TSH 5.06 (H) 12/11/2019    Last vitamin D Lab  Results  Component Value Date   VD25OH 10.99 (L) 12/11/2019    She has also been on some high-dose vitamin D  R ring finger trigger  Recheck D and thyroid levels  She also has some right-sided fourth digit triggering.  Immunization History  Administered Date(s) Administered   Fluad Quad(high Dose 65+) 07/01/2019   Influenza Whole 07/31/2008, 07/12/2010   Influenza, Seasonal, Injecte, Preservative Fre 07/08/2015, 07/26/2016, 08/02/2017   Influenza,inj,Quad PF,6+ Mos 07/13/2018   PFIZER SARS-COV-2 Vaccination 11/29/2019, 12/24/2019   Pneumococcal Conjugate-13 08/21/2014   Pneumococcal Polysaccharide-23 09/07/2015   Tdap 04/01/2014     Review of Systems is noted in the HPI, as appropriate  Objective:   BP 112/60    Pulse 85    Temp 97.8 F (36.6 C) (Oral)    Ht 4' 11.5" (1.511 m)    Wt 176 lb 6 oz (80 kg)    SpO2 95%    BMI 35.03 kg/m   GEN: No acute distress; alert,appropriate. PULM: Breathing comfortably in no respiratory distress PSYCH: Normally interactive.   CV: RRR, no m/g/r   Classic triggering at the fourth digit on the right with a palpable nodule  Laboratory and Imaging Data: Results for orders placed or performed in visit on 03/26/20  POCT glycosylated hemoglobin (Hb A1C)  Result Value Ref Range   Hemoglobin A1C 6.9 (A) 4.0 - 5.6 %   HbA1c POC (<> result, manual entry)     HbA1c, POC (prediabetic range)     HbA1c,  POC (controlled diabetic range)       Assessment and Plan:     ICD-10-CM   1. Controlled type 2 diabetes mellitus without complication, without long-term current use of insulin (HCC)  E11.9 POCT glycosylated hemoglobin (Hb A1C)  2. Vitamin D deficiency  E55.9 VITAMIN D 25 Hydroxy (Vit-D Deficiency, Fractures)  3. Other specified hypothyroidism  E03.8 TSH    T3, free    T4, free  4. Acquired trigger finger of right index finger  M65.321 methylPREDNISolone acetate (DEPO-MEDROL) injection 20 mg   Recheck vitamin D and thyroid levels.   Diabetes is stable.  We discussed the pathophysiology of trigger fingers. Discussed the inflammatory nature of nodule creation and likely nodule abutting the A1 pulley system, this causing the patient's discomfort and sensations. We discussed that treatments for this include direct injection into the tendon sheath to attempt to shrink catching tissue. This can be done 1-2 times. Other treatments include surgical release. If the patient fails to trigger finger injections, I would recommend trigger finger release if the patient desires relief of the symptoms.   Tendon Sheath Injection Procedure Note Robin Bass [birth year] Date of procedure: 03/26/2020  Procedure: Tendon Sheath Injection for Trigger Finger, R 4th Indications: Pain  Procedure Details Verbal consent was obtained. Risks (including rare risk of infection, potential risk for skin lightening and potential atrophy), benefits and alternatives were discussed. Prepped with Chloraprep and Ethyl Chloride used for anesthesia. Under sterile conditions, patient injected at palmar crease aiming distally with 45 degree angle towards nodule; injected directly into tendon sheath. Medication flowed freely without resistance.  Needle size: 22 gauge 1 1/2 inch Injection: 1/2 cc of Lidocaine 1% and Depo-Medrol 20 mg Medication: Depo-Medrol 20 mg  Follow-up: Return for after 11/2020 for medicare wellness / physical.  Meds ordered this encounter  Medications   methylPREDNISolone acetate (DEPO-MEDROL) injection 20 mg   There are no discontinued medications. Orders Placed This Encounter  Procedures   VITAMIN D 25 Hydroxy (Vit-D Deficiency, Fractures)   TSH   T3, free   T4, free   POCT glycosylated hemoglobin (Hb A1C)    Signed,  Laneka Mcgrory T. Karmela Bram, MD   Outpatient Encounter Medications as of 03/26/2020  Medication Sig   albuterol (VENTOLIN HFA) 108 (90 Base) MCG/ACT inhaler Inhale 2 puffs into the lungs every 6 (six) hours  as needed for wheezing or shortness of breath.   amLODipine (NORVASC) 10 MG tablet TAKE 1 TABLET BY MOUTH ONCE A DAY   atorvastatin (LIPITOR) 20 MG tablet TAKE 1 TABLET BY MOUTH ONCE DAILY   benzonatate (TESSALON) 200 MG capsule Take 1 capsule (200 mg total) by mouth 3 (three) times daily as needed. Swallow whole, do not bite the pill   clonazePAM (KLONOPIN) 0.5 MG tablet Take 1 tablet (0.5 mg total) by mouth 2 (two) times daily as needed for anxiety.   FLUoxetine (PROZAC) 20 MG capsule TAKE 1 CAPSULE BY MOUTH ONCE DAILY   guaiFENesin-codeine (CHERATUSSIN AC) 100-10 MG/5ML syrup Take 5 mLs by mouth 2 (two) times daily as needed for cough (sedation precautions).   levothyroxine (SYNTHROID) 50 MCG tablet TAKE 1 TABLET BY MOUTH ONCE A DAY. TAKE ON AN EMPTY STOMACH WITH A GLASS OF WATER ATLEAST 30-60 MIN BEFORE BREAKFAST   losartan-hydrochlorothiazide (HYZAAR) 100-25 MG tablet Take 1 tablet by mouth daily.   metFORMIN (GLUCOPHAGE XR) 500 MG 24 hr tablet Take 2 tablets (1,000 mg total) by mouth daily with breakfast.   ONE TOUCH ULTRA TEST test strip  USE TO CHECK BLOOD SUGAR TWICE A DAY AND AS DIRECTED   ONETOUCH DELICA LANCETS 99991111 MISC Use to check blood sugar two times a day   Vitamin D, Ergocalciferol, (DRISDOL) 1.25 MG (50000 UNIT) CAPS capsule Take 1 capsule (50,000 Units total) by mouth every 7 (seven) days.   [EXPIRED] methylPREDNISolone acetate (DEPO-MEDROL) injection 20 mg    No facility-administered encounter medications on file as of 03/26/2020.

## 2020-03-27 LAB — T3, FREE: T3, Free: 3.7 pg/mL (ref 2.3–4.2)

## 2020-03-27 LAB — T4, FREE: Free T4: 1.16 ng/dL (ref 0.60–1.60)

## 2020-03-27 LAB — TSH: TSH: 3.79 u[IU]/mL (ref 0.35–4.50)

## 2020-03-27 LAB — VITAMIN D 25 HYDROXY (VIT D DEFICIENCY, FRACTURES): VITD: 46.74 ng/mL (ref 30.00–100.00)

## 2020-04-14 DIAGNOSIS — H2512 Age-related nuclear cataract, left eye: Secondary | ICD-10-CM | POA: Diagnosis not present

## 2020-04-14 DIAGNOSIS — H52223 Regular astigmatism, bilateral: Secondary | ICD-10-CM | POA: Diagnosis not present

## 2020-04-14 DIAGNOSIS — H25042 Posterior subcapsular polar age-related cataract, left eye: Secondary | ICD-10-CM | POA: Diagnosis not present

## 2020-04-14 DIAGNOSIS — Z9841 Cataract extraction status, right eye: Secondary | ICD-10-CM | POA: Diagnosis not present

## 2020-04-14 DIAGNOSIS — E119 Type 2 diabetes mellitus without complications: Secondary | ICD-10-CM | POA: Diagnosis not present

## 2020-05-11 ENCOUNTER — Other Ambulatory Visit: Payer: Self-pay | Admitting: Family Medicine

## 2020-05-11 ENCOUNTER — Telehealth: Payer: Self-pay | Admitting: *Deleted

## 2020-05-11 MED ORDER — METFORMIN HCL ER 500 MG PO TB24: 500.0000 mg | ORAL_TABLET | Freq: Every day | ORAL | 3 refills | Status: DC

## 2020-05-11 NOTE — Telephone Encounter (Signed)
2 tabs daily

## 2020-05-11 NOTE — Telephone Encounter (Signed)
It looks like Dr. Lorelei Pont increased her Metformin XR to 2 tablets daily back in 11/2019.  Will confirm with Dr. Lorelei Pont that patient should be taking 2 tablets daily in stead of 1 tablets.  Please advise.

## 2020-05-11 NOTE — Telephone Encounter (Signed)
Spoke with Robin Bass.  She states she had been cheating on her diet on the days she didn't check her sugar and that is why her A1c was up in February.  She made lifestyle changes and her A1c was better in June.  She does not want to take 2 Metformin daily.  FYI to [name].  Ok to update med list back to one tablet daily?  Please advise.

## 2020-05-11 NOTE — Telephone Encounter (Signed)
Layne at [facility] left a voicemail stating that she had gotten a message from Dr. Lorelei Pont that script was sent in on 12/16/19 #180/3 refills. Layne stated that patient has only been taking one a day. Layne stated if this is correct that they need a new script showing once a day. Layne stated that if this is not correct than someone needs to contact the patient and let her know how she is suppose to be taking the Metformin.

## 2020-05-11 NOTE — Telephone Encounter (Signed)
She can always make that call

## 2020-05-11 NOTE — Telephone Encounter (Signed)
Medication list updated.  Refill sent in for Metformin XR 500 mg to take 1 tablet daily.  Sent to [facility].

## 2020-05-21 DIAGNOSIS — H25042 Posterior subcapsular polar age-related cataract, left eye: Secondary | ICD-10-CM | POA: Diagnosis not present

## 2020-05-21 DIAGNOSIS — H18413 Arcus senilis, bilateral: Secondary | ICD-10-CM | POA: Diagnosis not present

## 2020-05-21 DIAGNOSIS — H2512 Age-related nuclear cataract, left eye: Secondary | ICD-10-CM | POA: Diagnosis not present

## 2020-05-21 DIAGNOSIS — H25012 Cortical age-related cataract, left eye: Secondary | ICD-10-CM | POA: Diagnosis not present

## 2020-06-03 DIAGNOSIS — Z961 Presence of intraocular lens: Secondary | ICD-10-CM | POA: Diagnosis not present

## 2020-06-03 DIAGNOSIS — H2512 Age-related nuclear cataract, left eye: Secondary | ICD-10-CM | POA: Diagnosis not present

## 2020-06-08 ENCOUNTER — Other Ambulatory Visit: Payer: Self-pay

## 2020-06-08 ENCOUNTER — Telehealth: Payer: Self-pay

## 2020-06-08 ENCOUNTER — Ambulatory Visit: Payer: Medicare Other

## 2020-06-08 ENCOUNTER — Other Ambulatory Visit: Payer: Self-pay | Admitting: Family Medicine

## 2020-06-08 DIAGNOSIS — E785 Hyperlipidemia, unspecified: Secondary | ICD-10-CM

## 2020-06-08 DIAGNOSIS — I10 Essential (primary) hypertension: Secondary | ICD-10-CM

## 2020-06-08 DIAGNOSIS — E119 Type 2 diabetes mellitus without complications: Secondary | ICD-10-CM

## 2020-06-08 MED ORDER — BUSPIRONE HCL 5 MG PO TABS: 5.0000 mg | ORAL_TABLET | Freq: Two times a day (BID) | ORAL | 2 refills | Status: DC | PRN

## 2020-06-08 NOTE — Chronic Care Management (AMB) (Addendum)
PCP consult from initial visit with pharmacist 06/08/2020.   Anxiety   Patient discussed that she reserves clonazepam for situations with more severe anxiety. She expressed the need for something milder for less severe anxiety on a day to day basis. She would like to consider adding buspirone 5 mg bid prn if you approve.  Preventive health   Patient plans to schedule her Dexa scan and Mammogram based on dicussion during call today.  Feel free to let me know if you have any questions or concerns.   Thank you, Sherre Poot, PharmD, [facility] Clinical Pharmacist [facility] [phone] (office) [phone] (mobile)

## 2020-06-08 NOTE — Telephone Encounter (Signed)
You need to make this encounter available to the patient or list a reason why you would not like to in Epic.  Thanks  Electronically Signed  By: Owens Loffler, MD On: 06/08/2020 4:03 PM

## 2020-06-08 NOTE — Chronic Care Management (AMB) (Signed)
Chronic Care Management Pharmacy  Name: Robin Bass  MRN: [id number] DOB: 1951/06/20  Chief Complaint/ HPI  Robin Bass,  68 y.o. , female presents for their Initial CCM visit with the clinical pharmacist via telephone due to COVID-19 Pandemic.  PCP : Owens Loffler, MD  Their chronic conditions include: COPD, Hypertension, GERD, Hypothyroidism, Diabetes, overative bladder, depression, hyperlipidemia.   Office Visits: 05/11/2020 - Patient request to take metformin once daily.  03/26/2020 - trigger finger injection performed. Diabetes stable.  12/24/2019 - COVID vaccine.  12/16/2019 - Increased metformin dosing. High-dose vitamin D protocol.   12/11/2019 - AWV Colonoscopy, mammogram, DEXA, eye exam postponed due to pandemic.  Consult Visit: 06/03/2020 - cataracts removed. 03/02/2020 - Urgent care for facial pain/sinus infection. Amoxicillin prescribed.  Medications: Outpatient Encounter Medications as of 06/08/2020  Medication Sig  . albuterol (VENTOLIN HFA) 108 (90 Base) MCG/ACT inhaler Inhale 2 puffs into the lungs every 6 (six) hours as needed for wheezing or shortness of breath.  [name] Kitchen amLODipine (NORVASC) 10 MG tablet TAKE 1 TABLET BY MOUTH ONCE A DAY  . atorvastatin (LIPITOR) 20 MG tablet TAKE 1 TABLET BY MOUTH ONCE DAILY  . Cholecalciferol (VITAMIN D) 50 MCG (2000 UT) CAPS Take 2,000 Units by mouth daily at 12 noon.  . clonazePAM (KLONOPIN) 0.5 MG tablet Take 1 tablet (0.5 mg total) by mouth 2 (two) times daily as needed for anxiety.  . diphenhydramine-acetaminophen (TYLENOL PM) 25-500 MG TABS tablet Take 1 tablet by mouth at bedtime.  [name] Kitchen FLUoxetine (PROZAC) 20 MG capsule TAKE 1 CAPSULE BY MOUTH ONCE DAILY  . guaiFENesin-codeine (CHERATUSSIN AC) 100-10 MG/5ML syrup Take 5 mLs by mouth 2 (two) times daily as needed for cough (sedation precautions).  [name] Kitchen levothyroxine (SYNTHROID) 50 MCG tablet TAKE 1 TABLET BY MOUTH ONCE A DAY. TAKE ON AN EMPTY STOMACH WITH  A GLASS OF WATER ATLEAST 30-60 MIN BEFORE BREAKFAST  . losartan-hydrochlorothiazide (HYZAAR) 100-25 MG tablet Take 1 tablet by mouth daily.  . metFORMIN (GLUCOPHAGE XR) 500 MG 24 hr tablet Take 1 tablet (500 mg total) by mouth daily with breakfast.  . ONE TOUCH ULTRA TEST test strip USE TO CHECK BLOOD SUGAR TWICE A DAY AND AS DIRECTED  . ONETOUCH DELICA LANCETS 40J MISC Use to check blood sugar two times a day  . benzonatate (TESSALON) 200 MG capsule Take 1 capsule (200 mg total) by mouth 3 (three) times daily as needed. Swallow whole, do not bite the pill (Patient not taking: Reported on 06/08/2020)  . Vitamin D, Ergocalciferol, (DRISDOL) 1.25 MG (50000 UNIT) CAPS capsule Take 1 capsule (50,000 Units total) by mouth every 7 (seven) days. (Patient not taking: Reported on 06/08/2020)   No facility-administered encounter medications on file as of 06/08/2020.   Allergies  Allergen Reactions  . Sulfonamide Derivatives   . Ace Inhibitors Cough   SDOH Screenings   Alcohol Screen: Low Risk   . Last Alcohol Screening Score (AUDIT): 0  Depression (PHQ2-9): Low Risk   . PHQ-2 Score: 0  Financial Resource Strain: Low Risk   . Difficulty of Paying Living Expenses: Not hard at all  Food Insecurity: No Food Insecurity  . Worried About Charity fundraiser in the Last Year: Never true  . Ran Out of Food in the Last Year: Never true  Housing: Low Risk   . Last Housing Risk Score: 0  Physical Activity: Insufficiently Active  . Days of Exercise per Week: 1 day  . Minutes of Exercise per Session: 60  min  Social Connections:   . Frequency of Communication with Friends and Family:   . Frequency of Social Gatherings with Friends and Family:   . Attends Religious Services:   . Active Member of Clubs or Organizations:   . Attends Archivist Meetings:   [name] Kitchen Marital Status:   Stress: No Stress Concern Present  . Feeling of Stress : Not at all  Tobacco Use: Low Risk   . Smoking Tobacco Use: Never  Smoker  . Smokeless Tobacco Use: Never Used  Transportation Needs: No Transportation Needs  . Lack of Transportation (Medical): No  . Lack of Transportation (Non-Medical): No   Current Diagnosis/Assessment:  Goals Addressed            This Visit's Progress   . Pharmacy Care Plan       CARE PLAN ENTRY (see longitudinal plan of care for additional care plan information)  Current Barriers:  . Chronic Disease Management support, education, and care coordination needs related to Hypertension, Hyperlipidemia, and Diabetes   Hypertension BP Readings from Last 3 Encounters:  03/26/20 112/60  03/02/20 137/80  12/16/19 132/70   . Pharmacist Clinical Goal(s): o Over the next 90 days, patient will work with PharmD and providers to maintain BP goal <130/80 . Current regimen:  o Losartan-hydrochlorothiazide 100-25 mg daily o Amlodipine 10 mg dialy . Interventions: o Discussed patient's blood pressure is at goal in office on current therapy.  o Patient does not check bp at home.  o Patient is working to resume yoga at home.  . Patient self care activities - Over the next 90 days, patient will: o Check BP if symptomatic, document, and provide at future appointments o Ensure daily salt intake < 2300 mg/day  Hyperlipidemia Lab Results  Component Value Date/Time   LDLCALC 80 12/11/2019 08:47 AM   LDLDIRECT 109.6 05/13/2013 04:36 PM   . Pharmacist Clinical Goal(s): o Over the next 90 days, patient will work with PharmD and providers to achieve LDL goal < 70 . Current regimen:  o Atorvastatin 20 mg daily . Interventions: o Patient reports improved diet since last lipid panel. o Recommend patient resume exercise at home for optimal heart health.  o Discussed the benefits of taking atorvastatin for heart and attack and stroke prevention.  . Patient self care activities - Over the next 90 days, patient will: o Continue to take atorvastatin 20 mg daily.  o Continue to eat healthy.   o Work to resume yoga classes at home.   Diabetes Lab Results  Component Value Date/Time   HGBA1C 6.9 (A) 03/26/2020 02:13 PM   HGBA1C 7.9 (H) 12/11/2019 08:47 AM   HGBA1C 6.9 (H) 11/15/2018 08:15 AM   . Pharmacist Clinical Goal(s): o Over the next 90 days, patient will work with PharmD and providers to maintain A1c goal <7% . Current regimen:  o Metformin XR 500 mg daily . Interventions: o Continue to check blood sugar every other day.  o Continue to eat a diet rich in lean proteins, fruits, vegetables and whole grains.  o Keep up the good work being mindful of carbohydrate portion sizes and balancing your meals.  . Patient self care activities - Over the next 90 days, patient will: o Check blood sugar once daily every other day, document, and provide at future appointments o Contact provider with any episodes of hypoglycemia  Bone Health . Pharmacist Clinical Goal(s) o Over the next 90 days, patient will work with PharmD and providers to get  updated Dexa scan ordered . Current regimen:  o Vitamin D 2000 units daily . Interventions: o Encouraged patient to work toward a goal of 1200 mg each day of calcium through diet and supplementation.  o Encouraged patient resume exercise for bone health.  . Patient self care activities - Over the next 90 days, patient will: o Schedule Dexa scan.  o Work toward daily goal of 1200 mg daily of Calcium  o Resume yoga class online at home.  Anxiety . Pharmacist Clinical Goal(s) o Over the next 90 days, patient will work with PharmD and providers to improve anxiety.  . Current regimen:  o Fluoxetine 20 mg daily o Clonazepam 0.5 mg bid prn anxiety . Interventions: o Discussed patient's current medication regimen.  o Encouraged patient resume exercise for mental health and anxiety improvement.   o Patient reserves clonazepam for more severe anxiety but feels that something milder would be helpful for less severe anxiety.  . Patient self  care activities - Over the next 90 days, patient will: o Consider beginning buspirone as needed for anxiety.  o Resume yoga class online at home.   Medication management . Pharmacist Clinical Goal(s): o Over the next 90 days, patient will work with PharmD and providers to maintain optimal medication adherence . Current pharmacy: [facility] . Interventions o Comprehensive medication review performed. o Continue current medication management strategy . Patient self care activities - Over the next 90 days, patient will: o Focus on medication adherence by continuing to use pill box.  o Take medications as prescribed o Report any questions or concerns to PharmD and/or provider(s)  Initial goal documentation         Diabetes   Recent Relevant Labs: Lab Results  Component Value Date/Time   HGBA1C 6.9 (A) 03/26/2020 02:13 PM   HGBA1C 7.9 (H) 12/11/2019 08:47 AM   HGBA1C 6.9 (H) 11/15/2018 08:15 AM   MICROALBUR 1.8 12/11/2019 08:47 AM   MICROALBUR 0.8 09/05/2016 08:12 AM     Checking BG: every other day  Recent FBG Readings: 120 mg/dL Recent after meal reading - 133, 127 mg/dL (rarely over 160 mg/dL but typically when "cheats")  Patient is currently controlled on the following medications:   Metformin xr 500 mg daily  One touch ultra test strips twice daily  One touch delica lancets twice daily  Last diabetic Foot exam: not listed in chart   Last diabetic Eye exam: Lab Results  Component Value Date/Time   HMDIABEYEEXA No Retinopathy 08/07/2014 11:05 AM      We discussed: diet and exercise extensively. Patient had been cheating on her healthier eating prior to elevated lab work in February. She cut back on cheating and desserts in her diet and her a1c. Monitors her intake of bread, pasta and dessert. Tries to moderate her portion sizes of carbohydrates. She bowled regularly until bowling alley closed locally. She began taking yoga when bowling alley closed.  Covid has limited her yoga classes. Once the new bowling alley opens up or yoga studio opens back up she will resume. Discussed the benefits of trying a yoga class on YouTube.   Plan  Continue current medications. Upon follow-up, see if patient has resumed exercise through yoga or bowling.   Hypertension   BP today is:  <130/80  Office blood pressures are  BP Readings from Last 3 Encounters:  03/26/20 112/60  03/02/20 137/80  12/16/19 132/70   Patient has failed these meds in the past: bisoprolol-hydrochlorothiazide, quinapril-hydrochlorothiazide Patient is currently  controlled on the following medications:   Amlodipine 10 mg daily pm  Losartan-hydrochlorothiazide 100-25 mg daily am  Patient checks BP at home infrequently  Patient home BP readings are ranging: not checking  We discussed diet and exercise extensively. Blood pressure has stabilized on medication.   Plan  Continue current medications and control with diet and exercise      Hyperlipidemia   LDL goal < 70  Lipid Panel     Component Value Date/Time   CHOL 160 12/11/2019 0847   TRIG 124.0 12/11/2019 0847   HDL 55.80 12/11/2019 0847   LDLCALC 80 12/11/2019 0847   LDLDIRECT 109.6 05/13/2013 1636    Hepatic Function Latest Ref Rng & Units 12/11/2019 09/11/2017 09/05/2016  Total Protein 6.0 - 8.3 g/dL 7.0 7.5 7.3  Albumin 3.5 - 5.2 g/dL 4.2 4.3 4.1  AST 0 - 37 U/L 13 14 13   ALT 0 - 35 U/L 13 11 13   Alk Phosphatase 39 - 117 U/L 114 102 114  Total Bilirubin 0.2 - 1.2 mg/dL 0.5 0.5 0.4  Bilirubin, Direct 0.0 - 0.3 mg/dL 0.1 0.0 0.1     The 10-year ASCVD risk score Mikey Bussing DC Jr., et al., 2013) is: 12.1%   Values used to calculate the score:     Age: 67 years     Sex: Female     Is Non-Hispanic African American: No     Diabetic: Yes     Tobacco smoker: No     Systolic Blood Pressure: 409 mmHg     Is BP treated: Yes     HDL Cholesterol: 55.8 mg/dL     Total Cholesterol: 160 mg/dL   Patient has failed  these meds in past: n/a Patient is currently uncontrolled on the following medications:  . Atorvastatin 20 mg daily  We discussed:  diet and exercise extensively. We discussed the benefits of statin therapy. Also discussed the impact of diet and exercise on cholesterol and heart health.    Plan  Continue current medications and control with diet and exercise  Hypothyroidism   Lab Results  Component Value Date/Time   TSH 3.79 03/26/2020 02:58 PM   TSH 5.06 (H) 12/11/2019 08:47 AM   FREET4 1.16 03/26/2020 02:58 PM   FREET4 0.98 12/11/2019 08:47 AM    Patient has failed these meds in past: n/a Patient is currently controlled on the following medications:  . Levothyroxine 50 mcg daily  We discussed: Patient has responded well to therapy. No issues at this time.   Plan  Continue current medications   Depression   Patient has failed these meds in past: citalopram Patient is currently controlled on the following medications:  . Fluoxetine 20 mg daily . Clonazepam 0.5 mg bid prn anxiety  We discussed:  Patient uses clonazepam in extreme situations when anxiety increases her blood pressure. She only takes as needed for extreme situations. It is the only thing that she can fill that calms her down. Patient does not feel that fluoxetine works to help her anxiety. Encouraged patient to resume yoga for exercise benefits with anxiety. Discussed the option of buspirone for a safer alternative for anxiety..She loves life but gets anxious over situations like driving and avoids flying.   Plan  Consider adding buspirone for better control of anxiety. Recommended resuming yoga for mental health..    Osteopenia / Osteoporosis   Last DEXA Scan: patient is scheduling. Overdue because of COVID.     VITD  Date Value Ref Range Status  03/26/2020 46.74 30.00 - 100.00 ng/mL Final   Patient has failed these meds in past: vitamin d 50,000 units Patient is currently controlled on the  following medications:  [name] Kitchen Vitamin D 2000 units daily  We discussed:  Recommend [phone] units of vitamin D daily. Recommend 1200 mg of calcium daily from dietary and supplemental sources. Recommend weight-bearing and muscle strengthening exercises for building and maintaining bone density.   Patient hates milk. She does not take calcium supplement at this time. Eats some cheese but not getting daily requirement in diet. Was supposed to have Dexa scan last year but delayed due to [place]. Patient plans to schedule mammogram and Dexa scan before next visit.   Plan  Continue current medications and recommended begin supplementing calcium   Health Maintenance   Patient is currently controlled on the following medications:  . Tylenol PM every day - sleep  Albuterol inhaler 2 puffs into the lungs every 6 hours prn wheezing/shortness of breath  Benzonotate 200 mg tid prn cough  Guafenesin-codeine 100-10 mg/72m 1 teaspoonful bid prn cough  We discussed:  Patient denies any issues with falls or confusion with low dose benadryl. Reports that it improves her sleep. Discussed proper inhaler technique. Patient gets annual cough that she can't get over each year for 10 years. Patient has never smoked but has lived with people who smoked around her in the past (father/husband). Not currently using inhaler but will need it when cough begins.    Plan  Continue current medications   Vaccines   Reviewed and discussed patient's vaccination history.    Immunization History  Administered Date(s) Administered  . Fluad Quad(high Dose 65+) 07/01/2019  . Influenza Whole 07/31/2008, 07/12/2010  . Influenza, Seasonal, Injecte, Preservative Fre 07/08/2015, 07/26/2016, 08/02/2017  . Influenza,inj,Quad PF,6+ Mos 07/13/2018  . PFIZER SARS-COV-2 Vaccination 11/29/2019, 12/24/2019  . Pneumococcal Conjugate-13 08/21/2014  . Pneumococcal Polysaccharide-23 09/07/2015  . Tdap 04/01/2014    Plan  Recommended  patient receive annual flu vaccine in office.   Medication Management   [facility] Uses pill box? Yes Pt endorses excellent compliance  We discussed: Patient uses morning and night pill box. She denies missed doses. No issues with picking up medication or taking pill box.   Plan  Continue current medication management strategy    Follow up: 6 month phone visit

## 2020-06-08 NOTE — Telephone Encounter (Signed)
Reasonable and done

## 2020-06-08 NOTE — Patient Instructions (Addendum)
Visit Information  Thank you for your time discussing your medications. I look forward to working with you to achieve your health care goals. Below is a summary of what we talked about during our visit.   Goals Addressed            This Visit's Progress   . Pharmacy Care Plan       CARE PLAN ENTRY (see longitudinal plan of care for additional care plan information)  Current Barriers:  . Chronic Disease Management support, education, and care coordination needs related to Hypertension, Hyperlipidemia, and Diabetes   Hypertension BP Readings from Last 3 Encounters:  03/26/20 112/60  03/02/20 137/80  12/16/19 132/70   . Pharmacist Clinical Goal(s): o Over the next 90 days, patient will work with PharmD and providers to maintain BP goal <130/80 . Current regimen:  o Losartan-hydrochlorothiazide 100-25 mg daily o Amlodipine 10 mg dialy . Interventions: o Discussed patient's blood pressure is at goal in office on current therapy.  o Patient does not check bp at home.  o Patient is working to resume yoga at home.  . Patient self care activities - Over the next 90 days, patient will: o Check BP if symptomatic, document, and provide at future appointments o Ensure daily salt intake < 2300 mg/day  Hyperlipidemia Lab Results  Component Value Date/Time   LDLCALC 80 12/11/2019 08:47 AM   LDLDIRECT 109.6 05/13/2013 04:36 PM   . Pharmacist Clinical Goal(s): o Over the next 90 days, patient will work with PharmD and providers to achieve LDL goal < 70 . Current regimen:  o Atorvastatin 20 mg daily . Interventions: o Patient reports improved diet since last lipid panel. o Recommend patient resume exercise at home for optimal heart health.  o Discussed the benefits of taking atorvastatin for heart and attack and stroke prevention.  . Patient self care activities - Over the next 90 days, patient will: o Continue to take atorvastatin 20 mg daily.  o Continue to eat healthy.  o Work to  resume yoga classes at home.   Diabetes Lab Results  Component Value Date/Time   HGBA1C 6.9 (A) 03/26/2020 02:13 PM   HGBA1C 7.9 (H) 12/11/2019 08:47 AM   HGBA1C 6.9 (H) 11/15/2018 08:15 AM   . Pharmacist Clinical Goal(s): o Over the next 90 days, patient will work with PharmD and providers to maintain A1c goal <7% . Current regimen:  o Metformin XR 500 mg daily . Interventions: o Continue to check blood sugar every other day.  o Continue to eat a diet rich in lean proteins, fruits, vegetables and whole grains.  o Keep up the good work being mindful of carbohydrate portion sizes and balancing your meals.  . Patient self care activities - Over the next 90 days, patient will: o Check blood sugar once daily every other day, document, and provide at future appointments o Contact provider with any episodes of hypoglycemia  Bone Health . Pharmacist Clinical Goal(s) o Over the next 90 days, patient will work with PharmD and providers to get updated Dexa scan ordered . Current regimen:  o Vitamin D 2000 units daily . Interventions: o Encouraged patient to work toward a goal of 1200 mg each day of calcium through diet and supplementation.  o Encouraged patient resume exercise for bone health.  . Patient self care activities - Over the next 90 days, patient will: o Schedule Dexa scan.  o Work toward daily goal of 1200 mg daily of Calcium  o Resume  yoga class online at home.  Anxiety . Pharmacist Clinical Goal(s) o Over the next 90 days, patient will work with PharmD and providers to improve anxiety.  . Current regimen:  o Fluoxetine 20 mg daily o Clonazepam 0.5 mg bid prn anxiety . Interventions: o Discussed patient's current medication regimen.  o Encouraged patient resume exercise for mental health and anxiety improvement.   o Patient reserves clonazepam for more severe anxiety but feels that something milder would be helpful for less severe anxiety.  . Patient self care activities  - Over the next 90 days, patient will: o Consider beginning buspirone as needed for anxiety.  o Resume yoga class online at home.   Medication management . Pharmacist Clinical Goal(s): o Over the next 90 days, patient will work with PharmD and providers to maintain optimal medication adherence . Current pharmacy: [facility] . Interventions o Comprehensive medication review performed. o Continue current medication management strategy . Patient self care activities - Over the next 90 days, patient will: o Focus on medication adherence by continuing to use pill box.  o Take medications as prescribed o Report any questions or concerns to PharmD and/or provider(s)  Initial goal documentation        Ms. Lamia was given information about Chronic Care Management services today including:  1. CCM service includes personalized support from designated clinical staff supervised by her physician, including individualized plan of care and coordination with other care providers 2. 24/7 contact phone numbers for assistance for urgent and routine care needs. 3. Standard insurance, coinsurance, copays and deductibles apply for chronic care management only during months in which we provide at least 20 minutes of these services. Most insurances cover these services at 100%, however patients may be responsible for any copay, coinsurance and/or deductible if applicable. This service may help you avoid the need for more expensive face-to-face services. 4. Only one practitioner may furnish and bill the service in a calendar month. 5. The patient may stop CCM services at any time (effective at the end of the month) by phone call to the office staff.  Patient agreed to services and verbal consent obtained.   The patient verbalized understanding of instructions provided today and agreed to receive a mailed copy of patient instruction and/or educational materials. Telephone follow up appointment with  pharmacy team member scheduled for: 11/2020  Sherre Poot, PharmD Clinical Pharmacist [facility] [phone] (office) [phone] (mobile)   Exercises To Do While Sitting  Exercises that you do while sitting (chair exercises) can give you many of the same benefits as full exercise. Benefits include strengthening your heart, burning calories, and keeping muscles and joints healthy. Exercise can also improve your mood and help with depression and anxiety. You may benefit from chair exercises if you are unable to do standing exercises because of:  Diabetic foot pain.  Obesity.  Illness.  Arthritis.  Recovery from surgery or injury.  Breathing problems.  Balance problems.  Another type of disability. Before starting chair exercises, check with your health care provider or a physical therapist to find out how much exercise you can tolerate and which exercises are safe for you. If your health care provider approves:  Start out slowly and build up over time. Aim to work up to about 10-20 minutes for each exercise session.  Make exercise part of your daily routine.  Drink water when you exercise. Do not wait until you are thirsty. Drink every 10-15 minutes.  Stop exercising right away  if you have pain, nausea, shortness of breath, or dizziness.  If you are exercising in a wheelchair, make sure to lock the wheels.  Ask your health care provider whether you can do tai chi or yoga. Many positions in these mind-body exercises can be modified to do while seated. Warm-up Before starting other exercises: 1. Sit up as straight as you can. Have your knees bent at 90 degrees, which is the shape of the capital letter "L." Keep your feet flat on the floor. 2. Sit at the front edge of your chair, if you can. 3. Pull in (tighten) the muscles in your abdomen and stretch your spine and neck as straight as you can. Hold this position for a few minutes. 4. Breathe in and out  evenly. Try to concentrate on your breathing, and relax your mind. Stretching Exercise A: Arm stretch 1. Hold your arms out straight in front of your body. 2. Bend your hands at the wrist with your fingers pointing up, as if signaling someone to stop. Notice the slight tension in your forearms as you hold the position. 3. Keeping your arms out and your hands bent, rotate your hands outward as far as you can and hold this stretch. Aim to have your thumbs pointing up and your pinkie fingers pointing down. Slowly repeat arm stretches for one minute as tolerated. Exercise B: Leg stretch 1. If you can move your legs, try to "draw" letters on the floor with the toes of your foot. Write your name with one foot. 2. Write your name with the toes of your other foot. Slowly repeat the movements for one minute as tolerated. Exercise C: Reach for the sky 1. Reach your hands as far over your head as you can to stretch your spine. 2. Move your hands and arms as if you are climbing a rope. Slowly repeat the movements for one minute as tolerated. Range of motion exercises Exercise A: Shoulder roll 1. Let your arms hang loosely at your sides. 2. Lift just your shoulders up toward your ears, then let them relax back down. 3. When your shoulders feel loose, rotate your shoulders in backward and forward circles. Do shoulder rolls slowly for one minute as tolerated. Exercise B: March in place 1. As if you are marching, pump your arms and lift your legs up and down. Lift your knees as high as you can. ? If you are unable to lift your knees, just pump your arms and move your ankles and feet up and down. March in place for one minute as tolerated. Exercise C: Seated jumping jacks 1. Let your arms hang down straight. 2. Keeping your arms straight, lift them up over your head. Aim to point your fingers to the ceiling. 3. While you lift your arms, straighten your legs and slide your heels along the floor to your  sides, as wide as you can. 4. As you bring your arms back down to your sides, slide your legs back together. ? If you are unable to use your legs, just move your arms. Slowly repeat seated jumping jacks for one minute as tolerated. Strengthening exercises Exercise A: Shoulder squeeze 1. Hold your arms straight out from your body to your sides, with your elbows bent and your fists pointed at the ceiling. 2. Keeping your arms in the bent position, move them forward so your elbows and forearms meet in front of your face. 3. Open your arms back out as wide as you can with your  elbows still bent, until you feel your shoulder blades squeezing together. Hold for 5 seconds. Slowly repeat the movements forward and backward for one minute as tolerated. Contact a health care provider if you:  Had to stop exercising due to any of the following: ? Pain. ? Nausea. ? Shortness of breath. ? Dizziness. ? Fatigue.  Have significant pain or soreness after exercising. Get help right away if you have:  Chest pain.  Difficulty breathing. These symptoms may represent a serious problem that is an emergency. Do not wait to see if the symptoms will go away. Get medical help right away. Call your local emergency services (911 in the [place] Do not drive yourself to the hospital. This information is not intended to replace advice given to you by your health care provider. Make sure you discuss any questions you have with your health care provider. Document Revised: 01/31/2019 Document Reviewed: 08/23/2017 Elsevier Patient Education  2020 [employer].

## 2020-06-08 NOTE — Progress Notes (Addendum)
I have collaborated with the care management provider regarding care management and care coordination activities outlined in this encounter and have reviewed this encounter including documentation in the note and care plan. I am certifying that I agree with the content of this note and encounter as supervising physician.   Signed,  Brookley Spitler T. Abeeha Twist, MD  

## 2020-06-08 NOTE — Telephone Encounter (Signed)
Thank you. It should be corrected.

## 2020-06-08 NOTE — Addendum Note (Signed)
Addended by: Owens Loffler on: 06/08/2020 04:48 PM   Modules accepted: Orders

## 2020-07-14 ENCOUNTER — Other Ambulatory Visit: Payer: Self-pay | Admitting: Family Medicine

## 2020-08-28 ENCOUNTER — Other Ambulatory Visit: Payer: Self-pay | Admitting: Family Medicine

## 2020-10-12 ENCOUNTER — Telehealth: Payer: Self-pay | Admitting: *Deleted

## 2020-10-12 MED ORDER — HYDROCHLOROTHIAZIDE 25 MG PO TABS: 25.0000 mg | ORAL_TABLET | Freq: Every day | ORAL | 1 refills | Status: DC

## 2020-10-12 MED ORDER — LOSARTAN POTASSIUM 100 MG PO TABS: 100.0000 mg | ORAL_TABLET | Freq: Every day | ORAL | 1 refills | Status: DC

## 2020-10-12 NOTE — Telephone Encounter (Signed)
Robin Bass with [name] left a voicemail stating that Hyzaar 100-25 is on back order. Robin Bass wants to know if Dr. Lorelei Pont will send the medications in separately for a 90 day supply on Losartan and HCTZ?  Robin Bass stated that he has no idea when the combination will be back in stock. [place]

## 2020-10-12 NOTE — Telephone Encounter (Signed)
Yes, can you do for me. Thanks

## 2020-10-12 NOTE — Telephone Encounter (Signed)
Prescriptions sent as instructed by Dr. Lorelei Pont.

## 2020-12-09 ENCOUNTER — Telehealth: Payer: Medicare Other

## 2020-12-16 ENCOUNTER — Telehealth: Payer: Self-pay

## 2020-12-16 NOTE — Chronic Care Management (AMB) (Addendum)
Chronic Care Management Pharmacy Assistant   Name: Robin Bass  MRN: [id number] DOB: 08-22-1951  Reason for Encounter: Disease State - Diabetes  Patient Questions:  1.  Have you seen any other providers since your last visit? No  2.  Any changes in your medicines or health? No   PCP : Owens Loffler, MD  Allergies:   Allergies  Allergen Reactions   Sulfonamide Derivatives    Ace Inhibitors Cough    Medications: Outpatient Encounter Medications as of 12/16/2020  Medication Sig   albuterol (VENTOLIN HFA) 108 (90 Base) MCG/ACT inhaler Inhale 2 puffs into the lungs every 6 (six) hours as needed for wheezing or shortness of breath.   amLODipine (NORVASC) 10 MG tablet TAKE 1 TABLET BY MOUTH ONCE DAILY   atorvastatin (LIPITOR) 20 MG tablet TAKE 1 TABLET BY MOUTH ONCE DAILY   benzonatate (TESSALON) 200 MG capsule Take 1 capsule (200 mg total) by mouth 3 (three) times daily as needed. Swallow whole, do not bite the pill (Patient not taking: Reported on 06/08/2020)   busPIRone (BUSPAR) 5 MG tablet Take 1 tablet (5 mg total) by mouth 2 (two) times daily as needed.   Cholecalciferol (VITAMIN D) 50 MCG (2000 UT) CAPS Take 2,000 Units by mouth daily at 12 noon.   clonazePAM (KLONOPIN) 0.5 MG tablet Take 1 tablet (0.5 mg total) by mouth 2 (two) times daily as needed for anxiety.   diphenhydramine-acetaminophen (TYLENOL PM) 25-500 MG TABS tablet Take 1 tablet by mouth at bedtime.   FLUoxetine (PROZAC) 20 MG capsule TAKE 1 CAPSULE BY MOUTH ONCE DAILY   guaiFENesin-codeine (CHERATUSSIN AC) 100-10 MG/5ML syrup Take 5 mLs by mouth 2 (two) times daily as needed for cough (sedation precautions).   hydrochlorothiazide (HYDRODIURIL) 25 MG tablet Take 1 tablet (25 mg total) by mouth daily.   levothyroxine (SYNTHROID) 50 MCG tablet TAKE 1 TABLET BY MOUTH ONCE A DAY. TAKE ON AN EMPTY STOMACH WITH A GLASS OF WATER ATLEAST 30-60 MIN BEFORE BREAKFAST   losartan (COZAAR) 100 MG tablet Take 1  tablet (100 mg total) by mouth daily.   metFORMIN (GLUCOPHAGE XR) 500 MG 24 hr tablet Take 1 tablet (500 mg total) by mouth daily with breakfast.   ONE TOUCH ULTRA TEST test strip USE TO CHECK BLOOD SUGAR TWICE A DAY AND AS DIRECTED   ONETOUCH DELICA LANCETS 53I MISC Use to check blood sugar two times a day   Vitamin D, Ergocalciferol, (DRISDOL) 1.25 MG (50000 UNIT) CAPS capsule Take 1 capsule (50,000 Units total) by mouth every 7 (seven) days. (Patient not taking: Reported on 06/08/2020)   No facility-administered encounter medications on file as of 12/16/2020.    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   Hypothyroidism 07/31/2008   Essential hypertension 07/31/2008   ALLERGIC RHINITIS 07/31/2008   ASTHMA 07/31/2008   COPD 07/31/2008   GERD 07/31/2008   Recent Relevant Labs: Lab Results  Component Value Date/Time   HGBA1C 6.9 (A) 03/26/2020 02:13 PM   HGBA1C 7.9 (H) 12/11/2019 08:47 AM   HGBA1C 6.9 (H) 11/15/2018 08:15 AM   MICROALBUR 1.8 12/11/2019 08:47 AM   MICROALBUR 0.8 09/05/2016 08:12 AM    Kidney Function Lab Results  Component Value Date/Time   CREATININE 0.75 12/11/2019 08:47 AM   CREATININE  0.73 09/11/2017 10:30 AM   GFR 77.09 12/11/2019 08:47 AM   GFRNONAA 92 07/31/2008 11:38 AM   GFRAA 112 07/31/2008 11:38 AM    Current antihyperglycemic regimen:  Metformin XR 500 mg daily  What recent interventions/DTPs have been made to improve glycemic control:  No recent changes  Have there been any recent hospitalizations or ED visits since last visit with CPP? No   Patient denies hypoglycemic symptoms, including Pale, Sweaty, Shaky, Hungry, Nervous/irritable and Vision changes   Patient denies hyperglycemic symptoms,  including blurry vision, excessive thirst, fatigue, polyuria and weakness   How often are you checking your blood sugar? Every other day. Am fasting and 2 hours after dinner.   What are your blood sugars ranging?  Fasting:  132 114 120 125 121 113 133 127 Before meals: N/A After meals:  144 129 128 146 122 120 114  Bedtime: N/A  During the week, how often does your blood glucose drop below 70? Never   Are you checking your feet daily/regularly? States feet look ok. Denies any wounds, sores or blisters. She does not recall last foot or eye exam.  Scheduled telephone visit with Debbora Dus 01/28/21 at 9:30 am. Patient aware to have all medications and blood sugar readings available for review.   Adherence Review: Is the patient currently on a STATIN medication? Yes Is the patient currently on ACE/ARB medication? Yes Does the patient have >5 day gap between last estimated fill dates? No gaps in adherence.   Follow-Up:  Pharmacist Review  Debbora Dus, CPP notified  Margaretmary Dys, [facility] Assistant [phone]  I have reviewed the care management and care coordination activities outlined in this encounter and I am certifying that I agree with the content of this note. No further action required.  Debbora Dus, PharmD Clinical Pharmacist [facility] [phone]

## 2021-01-11 ENCOUNTER — Other Ambulatory Visit: Payer: Self-pay | Admitting: Family Medicine

## 2021-01-12 NOTE — Telephone Encounter (Signed)
Please schedule MWV with nurse and CPE with Dr. Copland.  °

## 2021-01-12 NOTE — Telephone Encounter (Signed)
Spoke with patient scheduled [place] with nurse and CPE

## 2021-01-21 DIAGNOSIS — D1801 Hemangioma of skin and subcutaneous tissue: Secondary | ICD-10-CM | POA: Diagnosis not present

## 2021-01-21 DIAGNOSIS — L723 Sebaceous cyst: Secondary | ICD-10-CM | POA: Diagnosis not present

## 2021-01-21 DIAGNOSIS — L821 Other seborrheic keratosis: Secondary | ICD-10-CM | POA: Diagnosis not present

## 2021-01-21 DIAGNOSIS — L570 Actinic keratosis: Secondary | ICD-10-CM | POA: Diagnosis not present

## 2021-01-21 DIAGNOSIS — D3612 Benign neoplasm of peripheral nerves and autonomic nervous system, upper limb, including shoulder: Secondary | ICD-10-CM | POA: Diagnosis not present

## 2021-02-10 ENCOUNTER — Other Ambulatory Visit: Payer: Self-pay | Admitting: Family Medicine

## 2021-02-18 ENCOUNTER — Telehealth: Payer: Self-pay

## 2021-02-18 NOTE — Chronic Care Management (AMB) (Addendum)
Chronic Care Management Pharmacy Assistant   Name: Robin Bass  MRN: [id number] DOB: [birth year]  Reason for Encounter: Disease State   Conditions to be addressed/monitored: HTN and HLD  Recent office visits:  None since last CCM contact  Recent consult visits:  None since last CCM contact  Hospital visits:  None in previous 6 months  Medications: Outpatient Encounter Medications as of 02/18/2021  Medication Sig   albuterol (VENTOLIN HFA) 108 (90 Base) MCG/ACT inhaler Inhale 2 puffs into the lungs every 6 (six) hours as needed for wheezing or shortness of breath.   amLODipine (NORVASC) 10 MG tablet TAKE 1 TABLET BY MOUTH ONCE DAILY   atorvastatin (LIPITOR) 20 MG tablet TAKE 1 TABLET BY MOUTH ONCE DAILY   benzonatate (TESSALON) 200 MG capsule Take 1 capsule (200 mg total) by mouth 3 (three) times daily as needed. Swallow whole, do not bite the pill (Patient not taking: Reported on 06/08/2020)   busPIRone (BUSPAR) 5 MG tablet Take 1 tablet (5 mg total) by mouth 2 (two) times daily as needed.   Cholecalciferol (VITAMIN D) 50 MCG (2000 UT) CAPS Take 2,000 Units by mouth daily at 12 noon.   clonazePAM (KLONOPIN) 0.5 MG tablet Take 1 tablet (0.5 mg total) by mouth 2 (two) times daily as needed for anxiety.   diphenhydramine-acetaminophen (TYLENOL PM) 25-500 MG TABS tablet Take 1 tablet by mouth at bedtime.   FLUoxetine (PROZAC) 20 MG capsule TAKE 1 CAPSULE BY MOUTH ONCE DAILY   guaiFENesin-codeine (CHERATUSSIN AC) 100-10 MG/5ML syrup Take 5 mLs by mouth 2 (two) times daily as needed for cough (sedation precautions).   levothyroxine (SYNTHROID) 50 MCG tablet TAKE 1 TABLET BY MOUTH ONCE A DAY. TAKE ON AN EMPTY STOMACH WITH A GLASS OF WATER ATLEAST 30-60 MIN BEFORE BREAKFAST   losartan-hydrochlorothiazide (HYZAAR) 100-25 MG tablet TAKE 1 TABLET BY MOUTH ONCE DAILY   metFORMIN (GLUCOPHAGE XR) 500 MG 24 hr tablet Take 1 tablet (500 mg total) by mouth daily with breakfast.   ONE  TOUCH ULTRA TEST test strip USE TO CHECK BLOOD SUGAR TWICE A DAY AND AS DIRECTED   ONETOUCH DELICA LANCETS 72I MISC Use to check blood sugar two times a day   Vitamin D, Ergocalciferol, (DRISDOL) 1.25 MG (50000 UNIT) CAPS capsule Take 1 capsule (50,000 Units total) by mouth every 7 (seven) days. (Patient not taking: Reported on 06/08/2020)   No facility-administered encounter medications on file as of 02/18/2021.   Recent Office Vitals: BP Readings from Last 3 Encounters:  03/26/20 112/60  03/02/20 137/80  12/16/19 132/70   Pulse Readings from Last 3 Encounters:  03/26/20 85  03/02/20 85  12/16/19 91    Wt Readings from Last 3 Encounters:  03/26/20 176 lb 6 oz (80 kg)  03/02/20 175 lb (79.4 kg)  12/16/19 177 lb 8 oz (80.5 kg)     Kidney Function Lab Results  Component Value Date/Time   CREATININE 0.75 12/11/2019 08:47 AM   CREATININE 0.73 09/11/2017 10:30 AM   GFR 77.09 12/11/2019 08:47 AM   GFRNONAA 92 07/31/2008 11:38 AM   GFRAA 112 07/31/2008 11:38 AM    BMP Latest Ref Rng & Units 12/11/2019 09/11/2017 09/05/2016  Glucose 70 - 99 mg/dL 133(H) 121(H) 130(H)  BUN 6 - 23 mg/dL 17 22 17   Creatinine 0.40 - 1.20 mg/dL 0.75 0.73 0.65  Sodium 135 - 145 mEq/L 141 138 142  Potassium 3.5 - 5.1 mEq/L 3.7 3.8 3.4(L)  Chloride 96 - 112 mEq/L 101  100 103  CO2 19 - 32 mEq/L 33(H) 31 30  Calcium 8.4 - 10.5 mg/dL 9.6 10.0 9.6   Hypertension  Current antihypertensive regimen:  Losartan-hydrochlorothiazide 100-25 mg daily in the morning Amlodipine 10 mg daily in the evening  Patient verbally confirms she is taking the above medications as directed. Yes  How often are you checking your Blood Pressure? The patient reports she is not currently checking her blood pressure.  Current home BP readings: none  Wrist or arm cuff:  The patient states she has an arm cuff Caffeine intake: no coffee, some caffeine free sodas. Salt intake:  Limits but will use with cooking OTC medications  including pseudoephedrine or NSAIDs? no  What recent interventions/DTPs have been made by any provider to improve Blood Pressure control since last CPP Visit:  The patient will begin to check her blood pressure more and write the readings down for the future   Any recent hospitalizations or ED visits since last visit with CPP? No  What diet changes have been made to improve Blood Pressure Control?  The patient maintains healthy food choices and watches her salt intake.  What exercise is being done to improve your Blood Pressure Control?  The patient enjoys yoga, however the studio has not opened due to the pandemic so she has not resumed. She stays active with walking around the grocery store, she shops, she cleans her house.   Adherence Review: Is the patient currently on ACE/ARB medication? Yes Does the patient have >5 day gap between last estimated fill dates? Yes - due 02/08/21 for refill on atorvastatin and metformin    Star Rating Drugs:  Medication:  Last Fill: Day Supply Atorvastatin 20mg . 11/10/2020  90ds Losartan-hydroch.100-25 mg. 01/12/2021 90[facility] Metformin XR 500mg  11/10/2020  90ds    Cholesterol  Lipid Panel    Component Value Date/Time   CHOL 160 12/11/2019 0847   TRIG 124.0 12/11/2019 0847   HDL 55.80 12/11/2019 0847   LDLCALC 80 12/11/2019 0847   LDLDIRECT 109.6 05/13/2013 1636    10-year ASCVD risk score: The 10-year ASCVD risk score Mikey Bussing DC Jr., et al., 2013) is: 13.5%   Values used to calculate the score:     Age: 70 years     Sex: Female     Is Non-Hispanic African American: No     Diabetic: Yes     Tobacco smoker: No     Systolic Blood Pressure: 440 mmHg     Is BP treated: Yes     HDL Cholesterol: 55.8 mg/dL     Total Cholesterol: 160 mg/dL   Current antihyperlipidemic regimen: Atorvastatin 20 mg daily in the evening  Previous antihyperlipidemic medications tried: none identified  ASCVD risk enhancing conditions: HTN  What recent  interventions/DTPs have been made by any provider to improve Cholesterol control since last CPP Visit:   None identified  Any recent hospitalizations or ED visits since last visit with CPP? No  What diet changes have been made to improve Cholesterol?  The patient eats healthy meals  What exercise is being done to improve Cholesterol?  The patient stays active with shopping, cleaning , would consider yoga in the future if the center opens up.  Debbora Dus, CPP notified  Avel Sensor, [place] Assistant [phone]  I have reviewed the care management and care coordination activities outlined in this encounter and I am certifying that I agree with the content of this note. No further action required.  Debbora Dus, PharmD Clinical  Pharmacist [facility] [phone]

## 2021-02-19 ENCOUNTER — Other Ambulatory Visit: Payer: Self-pay | Admitting: Family Medicine

## 2021-03-02 ENCOUNTER — Ambulatory Visit (INDEPENDENT_AMBULATORY_CARE_PROVIDER_SITE_OTHER): Payer: Medicare Other

## 2021-03-02 ENCOUNTER — Other Ambulatory Visit: Payer: Self-pay

## 2021-03-02 DIAGNOSIS — Z0000 Encounter for general adult medical examination without abnormal findings: Secondary | ICD-10-CM | POA: Diagnosis not present

## 2021-03-02 NOTE — Progress Notes (Signed)
PCP notes:  Health Maintenance: Colonoscopy- declined Mammogram- due Dexa- due Eye exam- due Foot exam- due    Abnormal Screenings: none   Patient concerns: Discomfort in finger on left hand Cortisone shot in knee   Nurse concerns: none   Next PCP appt.: 03/11/2021 @ 2:20 pm

## 2021-03-02 NOTE — Progress Notes (Signed)
Subjective:   Robin Bass is a 70 y.o. female who presents for Medicare Annual (Subsequent) preventive examination.  Review of Systems: N/A      I connected with the patient today by telephone and verified that I am speaking with the correct person using two identifiers. Location patient: home Location nurse: work Persons participating in the telephone visit: patient, nurse.   I discussed the limitations, risks, security and privacy concerns of performing an evaluation and management service by telephone and the availability of in person appointments. I also discussed with the patient that there may be a patient responsible charge related to this service. The patient expressed understanding and verbally consented to this telephonic visit.        Cardiac Risk Factors include: advanced age (>61men, >52 women);diabetes mellitus;hypertension;Other (see comment), Risk factor comments: hyperlipidemia     Objective:    Today's Vitals   There is no height or weight on file to calculate BMI.  Advanced Directives 03/02/2021 03/02/2020 12/11/2019 12/02/2019  Does Patient Have a Medical Advance Directive? Yes No Yes No  Type of Paramedic of [place];Living will - [place];Living will -  Copy of [place] in Chart? No - copy requested - No - copy requested -    Current Medications (verified) Outpatient Encounter Medications as of 03/02/2021  Medication Sig  . albuterol (VENTOLIN HFA) 108 (90 Base) MCG/ACT inhaler Inhale 2 puffs into the lungs every 6 (six) hours as needed for wheezing or shortness of breath.  [name] Kitchen amLODipine (NORVASC) 10 MG tablet TAKE 1 TABLET BY MOUTH ONCE A DAY  . atorvastatin (LIPITOR) 20 MG tablet TAKE 1 TABLET BY MOUTH ONCE DAILY  . benzonatate (TESSALON) 200 MG capsule Take 1 capsule (200 mg total) by mouth 3 (three) times daily as needed. Swallow whole, do not bite the pill  . busPIRone (BUSPAR)  5 MG tablet Take 1 tablet (5 mg total) by mouth 2 (two) times daily as needed.  . Cholecalciferol (VITAMIN D) 50 MCG (2000 UT) CAPS Take 2,000 Units by mouth daily at 12 noon.  . clonazePAM (KLONOPIN) 0.5 MG tablet Take 1 tablet (0.5 mg total) by mouth 2 (two) times daily as needed for anxiety.  . diphenhydramine-acetaminophen (TYLENOL PM) 25-500 MG TABS tablet Take 1 tablet by mouth at bedtime.  [name] Kitchen FLUoxetine (PROZAC) 20 MG capsule TAKE 1 CAPSULE BY MOUTH ONCE DAILY  . guaiFENesin-codeine (CHERATUSSIN AC) 100-10 MG/5ML syrup Take 5 mLs by mouth 2 (two) times daily as needed for cough (sedation precautions).  [name] Kitchen levothyroxine (SYNTHROID) 50 MCG tablet TAKE 1 TABLET BY MOUTH ONCE A DAY. TAKE ON AN EMPTY STOMACH WITH A GLASS OF WATER ATLEAST 30-60 MIN BEFORE BREAKFAST  . losartan-hydrochlorothiazide (HYZAAR) 100-25 MG tablet TAKE 1 TABLET BY MOUTH ONCE DAILY  . metFORMIN (GLUCOPHAGE XR) 500 MG 24 hr tablet Take 1 tablet (500 mg total) by mouth daily with breakfast.  . ONE TOUCH ULTRA TEST test strip USE TO CHECK BLOOD SUGAR TWICE A DAY AND AS DIRECTED  . ONETOUCH DELICA LANCETS 03K MISC Use to check blood sugar two times a day  . Vitamin D, Ergocalciferol, (DRISDOL) 1.25 MG (50000 UNIT) CAPS capsule Take 1 capsule (50,000 Units total) by mouth every 7 (seven) days.   No facility-administered encounter medications on file as of 03/02/2021.    Allergies (verified) Sulfonamide derivatives and Ace inhibitors   History: Past Medical History:  Diagnosis Date  . Allergy   . Asthma   .  Cancer ([place])    skin  . COPD (chronic obstructive pulmonary disease) ([place])   . Diabetes mellitus type 2, controlled, without complications ([place]) 09/47/0962  . GERD (gastroesophageal reflux disease)   . History of pituitary tumor   . Hypothyroidism   . Overactive bladder   . Prolactin deficiency [facility])    Past Surgical History:  Procedure Laterality Date  . ABDOMINAL HYSTERECTOMY     Family History  Problem  Relation Age of Onset  . Hypertension Mother   . Heart attack Father   . Breast cancer Neg Hx    Social History   Socioeconomic History  . Marital status: Married    Spouse name: Not on file  . Number of children: 1  . Years of education: Not on file  . Highest education level: Not on file  Occupational History  . Occupation: Producer, television/film/video: UNEMPLOYED  Tobacco Use  . Smoking status: Never Smoker  . Smokeless tobacco: Never Used  Vaping Use  . Vaping Use: Never used  Substance and Sexual Activity  . Alcohol use: No  . Drug use: No  . Sexual activity: Not on file  Other Topics Concern  . Not on file  Social History Narrative  . Not on file   Social Determinants of Health   Financial Resource Strain: Low Risk   . Difficulty of Paying Living Expenses: Not hard at all  Food Insecurity: No Food Insecurity  . Worried About Charity fundraiser in the Last Year: Never true  . Ran Out of Food in the Last Year: Never true  Transportation Needs: No Transportation Needs  . Lack of Transportation (Medical): No  . Lack of Transportation (Non-Medical): No  Physical Activity: Inactive  . Days of Exercise per Week: 0 days  . Minutes of Exercise per Session: 0 min  Stress: Stress Concern Present  . Feeling of Stress : To some extent  Social Connections: Not on file    Tobacco Counseling Counseling given: Not Answered   Clinical Intake:  Pre-visit preparation completed: Yes  Pain : No/denies pain     Nutritional Risks: None Diabetes: Yes CBG done?: No Did pt. bring in CBG monitor from home?: No  How often do you need to have someone help you when you read instructions, pamphlets, or other written materials from your doctor or pharmacy?: 1 - Never What is the last grade level you completed in school?: 12th  Diabetic: Yes Nutrition Risk Assessment:  Has the patient had any N/V/D within the last 2 months?  No  Does the patient have any non-healing wounds?  No   Has the patient had any unintentional weight loss or weight gain?  No   Diabetes:  Is the patient diabetic?  Yes  If diabetic, was a CBG obtained today?  No  telephone visit  Did the patient bring in their glucometer from home?  No  telephone visit  How often do you monitor your CBG's? Every other day.   Financial Strains and Diabetes Management:  Are you having any financial strains with the device, your supplies or your medication? No .  Does the patient want to be seen by Chronic Care Management for management of their diabetes?  No  Would the patient like to be referred to a Nutritionist or for Diabetic Management?  No   Diabetic Exams:  Diabetic Eye Exam: Overdue for diabetic eye exam. Pt has been advised about the importance in completing this exam. Patient advised  to call and schedule an eye exam. Diabetic Foot Exam: Completed 03/11/2021   Interpreter Needed?: No  Information entered by :: CJohnson, LPN   Activities of Daily Living In your present state of health, do you have any difficulty performing the following activities: 03/02/2021  Hearing? N  Vision? N  Difficulty concentrating or making decisions? N  Walking or climbing stairs? N  Dressing or bathing? N  Doing errands, shopping? N  Preparing Food and eating ? N  Using the Toilet? N  In the past six months, have you accidently leaked urine? Y  Comment wears a pad  Do you have problems with loss of bowel control? N  Managing your Medications? N  Managing your Finances? N  Housekeeping or managing your Housekeeping? N  Some recent data might be hidden    Patient Care Team: Owens Loffler, MD as PCP - General  Indicate any recent Medical Services you may have received from other than [facility] providers in the past year (date may be approximate).     Assessment:   This is a routine wellness examination for [place].  Hearing/Vision screen  Hearing Screening   125Hz  250Hz  500Hz  1000Hz  2000Hz  3000Hz  4000Hz   6000Hz  8000Hz   Right ear:           Left ear:           Vision Screening Comments: Patient gets annual eye exams   Dietary issues and exercise activities discussed: Current Exercise Habits: The patient does not participate in regular exercise at present, Exercise limited by: None identified  Goals Addressed            This Visit's Progress   . Patient Stated       03/02/2021, I will maintain and continue medications as prescribed.       Depression Screen PHQ 2/9 Scores 03/02/2021 06/08/2020 12/11/2019 11/19/2018 09/11/2017  PHQ - 2 Score 0 0 0 0 0  PHQ- 9 Score 0 - 0 - -    Fall Risk Fall Risk  03/02/2021 12/11/2019 11/19/2018  Falls in the past year? 0 0 0  Number falls in past yr: 0 0 -  Injury with Fall? 0 0 -  Risk for fall due to : Medication side effect Medication side effect -  Follow up Falls evaluation completed;Falls prevention discussed Falls evaluation completed;Falls prevention discussed -    FALL RISK PREVENTION PERTAINING TO THE HOME:  Any stairs in or around the home? Yes  If so, are there any without handrails? No  Home free of loose throw rugs in walkways, pet beds, electrical cords, etc? Yes  Adequate lighting in your home to reduce risk of falls? Yes   ASSISTIVE DEVICES UTILIZED TO PREVENT FALLS:  Life alert? No  Use of a cane, walker or w/c? No  Grab bars in the bathroom? No  Shower chair or bench in shower? No  Elevated toilet seat or a handicapped toilet? No   TIMED UP AND GO:  Was the test performed? N/A telephone visit .   Cognitive Function: MMSE - Mini Mental State Exam 03/02/2021 12/11/2019  Orientation to time 5 5  Orientation to Place 5 5  Registration 3 3  Attention/ Calculation 5 5  Recall 3 3  Language- repeat 1 1  Mini Cog  Mini-Cog screen was completed. Maximum score is 22. A value of 0 denotes this part of the MMSE was not completed or the patient failed this part of the Mini-Cog screening.  Immunizations Immunization  History  Administered Date(s) Administered  . Fluad Quad(high Dose 65+) 07/01/2019  . Influenza Whole 07/31/2008, 07/12/2010  . Influenza, Seasonal, Injecte, Preservative Fre 07/08/2015, 07/26/2016, 08/02/2017  . Influenza,inj,Quad PF,6+ Mos 07/13/2018  . PFIZER(Purple Top)SARS-COV-2 Vaccination 11/29/2019, 12/24/2019  . Pneumococcal Conjugate-13 08/21/2014  . Pneumococcal Polysaccharide-23 09/07/2015  . Tdap 04/01/2014    TDAP status: Up to date  Flu Vaccine status: due Fall 2022  Pneumococcal vaccine status: Up to date  Covid-19 vaccine status:up to date, Advised to bring card to physical so we can update in chart  Qualifies for Shingles Vaccine? Yes   Zostavax completed No   Shingrix Completed?: No.    Education has been provided regarding the importance of this vaccine. Patient has been advised to call insurance company to determine out of pocket expense if they have not yet received this vaccine. Advised may also receive vaccine at local pharmacy or Health Dept. Verbalized acceptance and understanding.  Screening Tests Health Maintenance  Topic Date Due  . OPHTHALMOLOGY EXAM  08/08/2015  . DEXA SCAN  Never done  . FOOT EXAM  11/20/2019  . MAMMOGRAM  01/03/2020  . COVID-19 Vaccine (3 - Booster for Pfizer series) 06/25/2020  . PNA vac Low Risk Adult (2 of 2 - PPSV23) 09/06/2020  . HEMOGLOBIN A1C  09/25/2020  . COLONOSCOPY (Pts 45-36yrs Insurance coverage will need to be confirmed)  03/02/2022 (Originally 12/29/1997)  . INFLUENZA VACCINE  05/24/2021  . TETANUS/TDAP  04/01/2024  . Hepatitis C Screening  Completed  . HPV VACCINES  Aged Out    Health Maintenance  Health Maintenance Due  Topic Date Due  . OPHTHALMOLOGY EXAM  08/08/2015  . DEXA SCAN  Never done  . FOOT EXAM  11/20/2019  . MAMMOGRAM  01/03/2020  . COVID-19 Vaccine (3 - Booster for Pfizer series) 06/25/2020  . PNA vac Low Risk Adult (2 of 2 - PPSV23) 09/06/2020  . HEMOGLOBIN A1C  09/25/2020     Colorectal cancer screening: declined  Mammogram status: due, will discuss with provider  Bone Density status: due, will discuss with provider  Lung Cancer Screening: (Low Dose CT Chest recommended if Age 36-80 years, 30 pack-year currently smoking OR have quit w/in 15 years.) does not qualify.   Additional Screening:  Hepatitis C Screening: does qualify; Completed 09/05/2016  Vision Screening: Recommended annual ophthalmology exams for early detection of glaucoma and other disorders of the eye. Is the patient up to date with their annual eye exam?  Yes  Who is the provider or what is the name of the office in which the patient attends annual eye exams? [place]  If pt is not established with a provider, would they like to be referred to a provider to establish care? No .   Dental Screening: Recommended annual dental exams for proper oral hygiene  Community Resource Referral / Chronic Care Management: CRR required this visit?  No   CCM required this visit?  No      Plan:     I have personally reviewed and noted the following in the patient's chart:   . Medical and social history . Use of alcohol, tobacco or illicit drugs  . Current medications and supplements including opioid prescriptions.  . Functional ability and status . Nutritional status . Physical activity . Advanced directives . List of other physicians . Hospitalizations, surgeries, and ER visits in previous 12 months . Vitals . Screenings to include cognitive, depression, and falls . Referrals and appointments  In addition, I have reviewed and discussed with patient certain preventive protocols, quality metrics, and best practice recommendations. A written personalized care plan for preventive services as well as general preventive health recommendations were provided to patient.   Due to this being a telephonic visit, the after visit summary with patients personalized plan was offered to  patient via office or my-chart. Patient preferred to pick up at office at next visit or via mychart.   Andrez Grime, LPN   [phone]

## 2021-03-02 NOTE — Patient Instructions (Signed)
Robin Bass , Thank you for taking time to come for your Medicare Wellness Visit. I appreciate your ongoing commitment to your health goals. Please review the following plan we discussed and let me know if I can assist you in the future.   Screening recommendations/referrals: Colonoscopy: declined Mammogram: due, will discuss with provider Bone Density: due, will discuss with provider Recommended yearly ophthalmology/optometry visit for glaucoma screening and checkup Recommended yearly dental visit for hygiene and checkup  Vaccinations: Influenza vaccine: due Fall 2022 Pneumococcal vaccine: Completed series Tdap vaccine: Up to date, completed 04/01/2014, due 03/2024 Shingles vaccine: due, check with your insurance regarding coverage if interested    Covid-19: up to date, Please bring copy of card to physical so we can update your chart  Advanced directives: Advance directive discussed with you today. Even though you declined this today please call our office should you change your mind and we can give you the proper paperwork for you to fill out.   Conditions/risks identified: diabetes, hypertension, hyperlipidemia   Next appointment: Follow up in one year for your annual wellness visit    Preventive Care 68 Years and Older, Female Preventive care refers to lifestyle choices and visits with your health care provider that can promote health and wellness. What does preventive care include?  A yearly physical exam. This is also called an annual well check.  Dental exams once or twice a year.  Routine eye exams. Ask your health care provider how often you should have your eyes checked.  Personal lifestyle choices, including:  Daily care of your teeth and gums.  Regular physical activity.  Eating a healthy diet.  Avoiding tobacco and drug use.  Limiting alcohol use.  Practicing safe sex.  Taking low-dose aspirin every day.  Taking vitamin and mineral supplements as recommended  by your health care provider. What happens during an annual well check? The services and screenings done by your health care provider during your annual well check will depend on your age, overall health, lifestyle risk factors, and family history of disease. Counseling  Your health care provider may ask you questions about your:  Alcohol use.  Tobacco use.  Drug use.  Emotional well-being.  Home and relationship well-being.  Sexual activity.  Eating habits.  History of falls.  Memory and ability to understand (cognition).  Work and work Statistician.  Reproductive health. Screening  You may have the following tests or measurements:  Height, weight, and BMI.  Blood pressure.  Lipid and cholesterol levels. These may be checked every 5 years, or more frequently if you are over 57 years old.  Skin check.  Lung cancer screening. You may have this screening every year starting at age 19 if you have a 30-pack-year history of smoking and currently smoke or have quit within the past 15 years.  Fecal occult blood test (FOBT) of the stool. You may have this test every year starting at age 18.  Flexible sigmoidoscopy or colonoscopy. You may have a sigmoidoscopy every 5 years or a colonoscopy every 10 years starting at age 67.  Hepatitis C blood test.  Hepatitis B blood test.  Sexually transmitted disease (STD) testing.  Diabetes screening. This is done by checking your blood sugar (glucose) after you have not eaten for a while (fasting). You may have this done every 1-3 years.  Bone density scan. This is done to screen for osteoporosis. You may have this done starting at age 83.  Mammogram. This may be done every 1-2  years. Talk to your health care provider about how often you should have regular mammograms. Talk with your health care provider about your test results, treatment options, and if necessary, the need for more tests. Vaccines  Your health care provider may  recommend certain vaccines, such as:  Influenza vaccine. This is recommended every year.  Tetanus, diphtheria, and acellular pertussis (Tdap, Td) vaccine. You may need a Td booster every 10 years.  Zoster vaccine. You may need this after age 95.  Pneumococcal 13-valent conjugate (PCV13) vaccine. One dose is recommended after age 74.  Pneumococcal polysaccharide (PPSV23) vaccine. One dose is recommended after age 2. Talk to your health care provider about which screenings and vaccines you need and how often you need them. This information is not intended to replace advice given to you by your health care provider. Make sure you discuss any questions you have with your health care provider. Document Released: 11/06/2015 Document Revised: 06/29/2016 Document Reviewed: 08/11/2015 Elsevier Interactive Patient Education  2017 [place] Prevention in the Home Falls can cause injuries. They can happen to people of all ages. There are many things you can do to make your home safe and to help prevent falls. What can I do on the outside of my home?  Regularly fix the edges of walkways and driveways and fix any cracks.  Remove anything that might make you trip as you walk through a door, such as a raised step or threshold.  Trim any bushes or trees on the path to your home.  Use bright outdoor lighting.  Clear any walking paths of anything that might make someone trip, such as rocks or tools.  Regularly check to see if handrails are loose or broken. Make sure that both sides of any steps have handrails.  Any raised decks and porches should have guardrails on the edges.  Have any leaves, snow, or ice cleared regularly.  Use sand or salt on walking paths during winter.  Clean up any spills in your garage right away. This includes oil or grease spills. What can I do in the bathroom?  Use night lights.  Install grab bars by the toilet and in the tub and shower. Do not use towel  bars as grab bars.  Use non-skid mats or decals in the tub or shower.  If you need to sit down in the shower, use a plastic, non-slip stool.  Keep the floor dry. Clean up any water that spills on the floor as soon as it happens.  Remove soap buildup in the tub or shower regularly.  Attach bath mats securely with double-sided non-slip rug tape.  Do not have throw rugs and other things on the floor that can make you trip. What can I do in the bedroom?  Use night lights.  Make sure that you have a light by your bed that is easy to reach.  Do not use any sheets or blankets that are too big for your bed. They should not hang down onto the floor.  Have a firm chair that has side arms. You can use this for support while you get dressed.  Do not have throw rugs and other things on the floor that can make you trip. What can I do in the kitchen?  Clean up any spills right away.  Avoid walking on wet floors.  Keep items that you use a lot in easy-to-reach places.  If you need to reach something above you, use a strong step  stool that has a grab bar.  Keep electrical cords out of the way.  Do not use floor polish or wax that makes floors slippery. If you must use wax, use non-skid floor wax.  Do not have throw rugs and other things on the floor that can make you trip. What can I do with my stairs?  Do not leave any items on the stairs.  Make sure that there are handrails on both sides of the stairs and use them. Fix handrails that are broken or loose. Make sure that handrails are as long as the stairways.  Check any carpeting to make sure that it is firmly attached to the stairs. Fix any carpet that is loose or worn.  Avoid having throw rugs at the top or bottom of the stairs. If you do have throw rugs, attach them to the floor with carpet tape.  Make sure that you have a light switch at the top of the stairs and the bottom of the stairs. If you do not have them, ask someone to  add them for you. What else can I do to help prevent falls?  Wear shoes that:  Do not have high heels.  Have rubber bottoms.  Are comfortable and fit you well.  Are closed at the toe. Do not wear sandals.  If you use a stepladder:  Make sure that it is fully opened. Do not climb a closed stepladder.  Make sure that both sides of the stepladder are locked into place.  Ask someone to hold it for you, if possible.  Clearly mark and make sure that you can see:  Any grab bars or handrails.  First and last steps.  Where the edge of each step is.  Use tools that help you move around (mobility aids) if they are needed. These include:  Canes.  Walkers.  Scooters.  Crutches.  Turn on the lights when you go into a dark area. Replace any light bulbs as soon as they burn out.  Set up your furniture so you have a clear path. Avoid moving your furniture around.  If any of your floors are uneven, fix them.  If there are any pets around you, be aware of where they are.  Review your medicines with your doctor. Some medicines can make you feel dizzy. This can increase your chance of falling. Ask your doctor what other things that you can do to help prevent falls. This information is not intended to replace advice given to you by your health care provider. Make sure you discuss any questions you have with your health care provider. Document Released: 08/06/2009 Document Revised: 03/17/2016 Document Reviewed: 11/14/2014 Elsevier Interactive Patient Education  2017 [employer].

## 2021-03-04 ENCOUNTER — Other Ambulatory Visit (INDEPENDENT_AMBULATORY_CARE_PROVIDER_SITE_OTHER): Payer: Medicare Other

## 2021-03-04 ENCOUNTER — Other Ambulatory Visit: Payer: Self-pay

## 2021-03-04 DIAGNOSIS — E039 Hypothyroidism, unspecified: Secondary | ICD-10-CM | POA: Diagnosis not present

## 2021-03-04 DIAGNOSIS — Z79899 Other long term (current) drug therapy: Secondary | ICD-10-CM | POA: Diagnosis not present

## 2021-03-04 DIAGNOSIS — E785 Hyperlipidemia, unspecified: Secondary | ICD-10-CM

## 2021-03-04 DIAGNOSIS — E119 Type 2 diabetes mellitus without complications: Secondary | ICD-10-CM | POA: Diagnosis not present

## 2021-03-04 DIAGNOSIS — E559 Vitamin D deficiency, unspecified: Secondary | ICD-10-CM

## 2021-03-04 LAB — MICROALBUMIN / CREATININE URINE RATIO
Creatinine,U: 121.6 mg/dL
Microalb Creat Ratio: 0.6 mg/g (ref 0.0–30.0)
Microalb, Ur: <0.7 mg/dL (ref 0.0–1.9)

## 2021-03-04 LAB — BASIC METABOLIC PANEL
BUN: 19 mg/dL (ref 6–23)
CO2: 30 mEq/L (ref 19–32)
Calcium: 9.6 mg/dL (ref 8.4–10.5)
Chloride: 101 mEq/L (ref 96–112)
Creatinine, Ser: 0.86 mg/dL (ref 0.40–1.20)
GFR: 69.53 mL/min (ref >60.00)
Glucose, Bld: 138 mg/dL — ABNORMAL HIGH (ref 70–99)
Potassium: 3.2 mEq/L — ABNORMAL LOW (ref 3.5–5.1)
Sodium: 140 mEq/L (ref 135–145)

## 2021-03-04 LAB — LIPID PANEL
Cholesterol: 159 mg/dL (ref 0–200)
HDL: 54.2 mg/dL (ref >39.00)
LDL Cholesterol: 76 mg/dL (ref 0–99)
NonHDL: 104.93
Total CHOL/HDL Ratio: 3
Triglycerides: 145 mg/dL (ref 0.0–149.0)
VLDL: 29 mg/dL (ref 0.0–40.0)

## 2021-03-04 LAB — CBC WITH DIFFERENTIAL/PLATELET
Basophils Absolute: 0.1 10*3/uL (ref 0.0–0.1)
Basophils Relative: 0.6 % (ref 0.0–3.0)
Eosinophils Absolute: 0.2 10*3/uL (ref 0.0–0.7)
Eosinophils Relative: 1.9 % (ref 0.0–5.0)
HCT: 36.7 % (ref 36.0–46.0)
Hemoglobin: 12.4 g/dL (ref 12.0–15.0)
Lymphocytes Relative: 31.8 % (ref 12.0–46.0)
Lymphs Abs: 2.6 10*3/uL (ref 0.7–4.0)
MCHC: 33.8 g/dL (ref 30.0–36.0)
MCV: 76.1 fl — ABNORMAL LOW (ref 78.0–100.0)
Monocytes Absolute: 0.4 10*3/uL (ref 0.1–1.0)
Monocytes Relative: 5.5 % (ref 3.0–12.0)
Neutro Abs: 4.9 10*3/uL (ref 1.4–7.7)
Neutrophils Relative %: 60.2 % (ref 43.0–77.0)
Platelets: 352 10*3/uL (ref 150.0–400.0)
RBC: 4.83 Mil/uL (ref 3.87–5.11)
RDW: 15.6 % — ABNORMAL HIGH (ref 11.5–15.5)
WBC: 8.1 10*3/uL (ref 4.0–10.5)

## 2021-03-04 LAB — T4, FREE: Free T4: 0.97 ng/dL (ref 0.60–1.60)

## 2021-03-04 LAB — HEPATIC FUNCTION PANEL
ALT: 14 U/L (ref 0–35)
AST: 13 U/L (ref 0–37)
Albumin: 4.2 g/dL (ref 3.5–5.2)
Alkaline Phosphatase: 113 U/L (ref 39–117)
Bilirubin, Direct: 0.1 mg/dL (ref 0.0–0.3)
Total Bilirubin: 0.6 mg/dL (ref 0.2–1.2)
Total Protein: 7 g/dL (ref 6.0–8.3)

## 2021-03-04 LAB — T3, FREE: T3, Free: 3.3 pg/mL (ref 2.3–4.2)

## 2021-03-04 LAB — VITAMIN D 25 HYDROXY (VIT D DEFICIENCY, FRACTURES): VITD: 35.98 ng/mL (ref 30.00–100.00)

## 2021-03-04 LAB — HEMOGLOBIN A1C: Hgb A1c MFr Bld: 7.2 % — ABNORMAL HIGH (ref 4.6–6.5)

## 2021-03-04 LAB — TSH: TSH: 4.23 u[IU]/mL (ref 0.35–4.50)

## 2021-03-09 DIAGNOSIS — C44311 Basal cell carcinoma of skin of nose: Secondary | ICD-10-CM | POA: Diagnosis not present

## 2021-03-09 DIAGNOSIS — L578 Other skin changes due to chronic exposure to nonionizing radiation: Secondary | ICD-10-CM | POA: Diagnosis not present

## 2021-03-09 DIAGNOSIS — L570 Actinic keratosis: Secondary | ICD-10-CM | POA: Diagnosis not present

## 2021-03-11 ENCOUNTER — Other Ambulatory Visit: Payer: Self-pay

## 2021-03-11 ENCOUNTER — Encounter: Payer: Self-pay | Admitting: Family Medicine

## 2021-03-11 ENCOUNTER — Ambulatory Visit (INDEPENDENT_AMBULATORY_CARE_PROVIDER_SITE_OTHER): Payer: Medicare Other | Admitting: Family Medicine

## 2021-03-11 VITALS — BP 126/70 | HR 80 | Temp 97.6°F | Ht 59.5 in | Wt 175.0 lb

## 2021-03-11 DIAGNOSIS — M1712 Unilateral primary osteoarthritis, left knee: Secondary | ICD-10-CM | POA: Diagnosis not present

## 2021-03-11 DIAGNOSIS — Z0001 Encounter for general adult medical examination with abnormal findings: Secondary | ICD-10-CM | POA: Diagnosis not present

## 2021-03-11 DIAGNOSIS — Z23 Encounter for immunization: Secondary | ICD-10-CM | POA: Diagnosis not present

## 2021-03-11 DIAGNOSIS — M65342 Trigger finger, left ring finger: Secondary | ICD-10-CM | POA: Diagnosis not present

## 2021-03-11 DIAGNOSIS — Z0000 Encounter for general adult medical examination without abnormal findings: Secondary | ICD-10-CM

## 2021-03-11 MED ORDER — TRIAMCINOLONE ACETONIDE 40 MG/ML IJ SUSP
40.0000 mg | Freq: Once | INTRAMUSCULAR | Status: AC
  Administered 2021-03-11: 40 mg via INTRA_ARTICULAR

## 2021-03-11 MED ORDER — TRIAMCINOLONE ACETONIDE 40 MG/ML IJ SUSP
20.0000 mg | Freq: Once | INTRAMUSCULAR | Status: AC
Start: 2021-03-11 — End: 2021-03-11
  Administered 2021-03-11: 20 mg via INTRA_ARTICULAR

## 2021-03-11 NOTE — Progress Notes (Signed)
Klea Nall T. Logun Colavito, MD, [place] at [facility] [place], [address]  Phone: [phone]  FAX: [phone]  Robin Bass - 70 y.o. female  MRN [id number]  Date of Birth: 1951/03/05  Date: 03/11/2021  PCP: Owens Loffler, MD  Referral: Owens Loffler, MD  Chief Complaint  Patient presents with  . Annual Exam    This visit occurred during the SARS-CoV-2 public health emergency.  Safety protocols were in place, including screening questions prior to the visit, additional usage of staff PPE, and extensive cleaning of exam room while observing appropriate contact time as indicated for disinfecting solutions.   Patient Care Team: Owens Loffler, MD as PCP - General Subjective:   Robin Bass is a 70 y.o. pleasant patient who presents with the following:  Health Maintenance Summary Reviewed and updated, unless pt declines services.  Tobacco History Reviewed. Non-smoker Alcohol: No concerns, no excessive use Exercise Habits: minimal activity STD concerns: none Drug Use: None Lumps or breast concerns: no  L 3rd digit trigger finger.   Eye Exam - [place]. No DM changes Foot exam Mammogram and BMD pending Pneumovax 23, #2 She has not had colon cancer screening  S/p hyst   Diabetes Mellitus: Tolerating Medications: yes Compliance with diet: fair, Body mass index is 34.75 kg/m. Exercise: minimal / intermittent Avg blood sugars at home: not checking Foot problems: none Hypoglycemia: none No nausea, vomitting, blurred vision, polyuria.  Lab Results  Component Value Date   HGBA1C 7.2 (H) 03/04/2021   HGBA1C 6.9 (A) 03/26/2020   HGBA1C 7.9 (H) 12/11/2019   Lab Results  Component Value Date   MICROALBUR <0.7 03/04/2021   LDLCALC 76 03/04/2021   CREATININE 0.86 03/04/2021    Wt Readings from Last 3 Encounters:  03/11/21 175 lb (79.4  kg)  03/26/20 176 lb 6 oz (80 kg)  03/02/20 175 lb (79.4 kg)    L 4th trigger finger, desires injection today.  She does have some mild L knee OA, and this is currently flared up.   Health Maintenance  Topic Date Due  . DEXA SCAN  Never done  . MAMMOGRAM  01/03/2020  . PNA vac Low Risk Adult (2 of 2 - PPSV23) 09/06/2020  . COLONOSCOPY (Pts 45-71yrs Insurance coverage will need to be confirmed)  03/02/2022 (Originally 12/29/1997)  . INFLUENZA VACCINE  05/24/2021  . HEMOGLOBIN A1C  09/04/2021  . OPHTHALMOLOGY EXAM  03/11/2022  . FOOT EXAM  03/12/2022  . TETANUS/TDAP  04/01/2024  . COVID-19 Vaccine  Completed  . Hepatitis C Screening  Completed  . HPV VACCINES  Aged Out    Immunization History  Administered Date(s) Administered  . Fluad Quad(high Dose 65+) 07/01/2019  . Influenza Whole 07/31/2008, 07/12/2010  . Influenza, Seasonal, Injecte, Preservative Fre 07/08/2015, 07/26/2016, 08/02/2017  . Influenza,inj,Quad PF,6+ Mos 07/13/2018  . PFIZER(Purple Top)SARS-COV-2 Vaccination 11/29/2019, 12/24/2019, 07/23/2020, 03/05/2021  . Pneumococcal Conjugate-13 08/21/2014  . Pneumococcal Polysaccharide-23 09/07/2015  . Tdap 04/01/2014   Patient Active Problem List   Diagnosis Date Noted  . Diabetes mellitus type 2, controlled, without complications ([place]) 09/32/6712    Priority: High  . Hypothyroidism 07/31/2008    Priority: Medium  . Essential hypertension 07/31/2008    Priority: Medium  . Hyperlipidemia LDL goal <70 09/07/2015  . Depression 03/31/2012  . Overactive bladder 02/05/2011  . 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 ASTHMA 07/31/2008  . COPD 07/31/2008  . GERD 07/31/2008  Past Medical History:  Diagnosis Date  . Asthma   . Basal cell carcinoma    skin  . COPD (chronic obstructive pulmonary disease) ([place])   . Diabetes mellitus type 2, controlled, without complications ([place]) 81/19/1478  . GERD (gastroesophageal reflux disease)   . History of pituitary tumor   .  Hypothyroidism   . Overactive bladder     Past Surgical History:  Procedure Laterality Date  . ABDOMINAL HYSTERECTOMY      Family History  Problem Relation Age of Onset  . Hypertension Mother   . Heart attack Father   . Breast cancer Neg Hx     Past Medical History, Surgical History, Social History, Family History, Problem List, Medications, and Allergies have been reviewed and updated if relevant.  Review of Systems: Pertinent positives are listed above.  Otherwise, a full 14 point review of systems has been done in full and it is negative except where it is noted positive.  Objective:   BP 126/70   Pulse 80   Temp 97.6 F (36.4 C) (Temporal)   Ht 4' 11.5" (1.511 m)   Wt 175 lb (79.4 kg)   SpO2 98%   BMI 34.75 kg/m  Ideal Body Weight: Weight in (lb) to have BMI = 25: 125.6 No exam data present Depression screen [facility] 2/9 03/02/2021 06/08/2020 12/11/2019 11/19/2018 09/11/2017  Decreased Interest 0 0 0 0 0  Down, Depressed, Hopeless 0 0 0 0 0  PHQ - 2 Score 0 0 0 0 0  Altered sleeping 0 - 0 - -  Tired, decreased energy 0 - 0 - -  Change in appetite 0 - 0 - -  Feeling bad or failure about yourself  0 - 0 - -  Trouble concentrating 0 - 0 - -  Moving slowly or fidgety/restless 0 - 0 - -  Suicidal thoughts 0 - 0 - -  PHQ-9 Score 0 - 0 - -  Difficult doing work/chores Not difficult at all - Not difficult at all - -     GEN: well developed, well nourished, no acute distress Eyes: conjunctiva and lids normal, PERRLA, EOMI ENT: TM clear, nares clear, oral exam WNL Neck: supple, no lymphadenopathy, no thyromegaly, no JVD Pulm: clear to auscultation and percussion, respiratory effort normal CV: regular rate and rhythm, S1-S2, no murmur, rub or gallop, no bruits Chest: no scars, masses, no lumps BREAST: breast exam declined GI: soft, non-tender; no hepatosplenomegaly, masses; active bowel sounds all quadrants GU: GU exam declined Lymph: no cervical, axillary or inguinal  adenopathy MSK: gait normal, muscle tone and strength WNL, no joint swelling, effusions, discoloration, crepitus   TTP L medial joint line. Obvious L 4th trigger finger  SKIN: clear, good turgor, color WNL, no rashes, lesions, or ulcerations Neuro: normal mental status, normal strength, sensation, and motion Psych: alert; oriented to person, place and time, normally interactive and not anxious or depressed in appearance.   All labs reviewed with patient. Results for orders placed or performed in visit on 03/04/21  VITAMIN D 25 Hydroxy (Vit-D Deficiency, Fractures)  Result Value Ref Range   VITD 35.98 30.00 - 100.00 ng/mL  TSH  Result Value Ref Range   TSH 4.23 0.35 - 4.50 uIU/mL  T4, free  Result Value Ref Range   Free T4 0.97 0.60 - 1.60 ng/dL  T3, free  Result Value Ref Range   T3, Free 3.3 2.3 - 4.2 pg/mL  Lipid panel  Result Value Ref Range   Cholesterol 159  0 - 200 mg/dL   Triglycerides 145.0 0.0 - 149.0 mg/dL   HDL 54.20 >39.00 mg/dL   VLDL 29.0 0.0 - 40.0 mg/dL   LDL Cholesterol 76 0 - 99 mg/dL   Total CHOL/HDL Ratio 3    NonHDL 104.93   Hemoglobin A1c  Result Value Ref Range   Hgb A1c MFr Bld 7.2 (H) 4.6 - 6.5 %  Microalbumin / creatinine urine ratio  Result Value Ref Range   Microalb, Ur <0.7 0.0 - 1.9 mg/dL   Creatinine,U 121.6 mg/dL   Microalb Creat Ratio 0.6 0.0 - 30.0 mg/g  CBC with Differential/Platelet  Result Value Ref Range   WBC 8.1 4.0 - 10.5 K/uL   RBC 4.83 3.87 - 5.11 Mil/uL   Hemoglobin 12.4 12.0 - 15.0 g/dL   HCT 36.7 36.0 - 46.0 %   MCV 76.1 (L) 78.0 - 100.0 fl   MCHC 33.8 30.0 - 36.0 g/dL   RDW 15.6 (H) 11.5 - 15.5 %   Platelets 352.0 150.0 - 400.0 K/uL   Neutrophils Relative % 60.2 43.0 - 77.0 %   Lymphocytes Relative 31.8 12.0 - 46.0 %   Monocytes Relative 5.5 3.0 - 12.0 %   Eosinophils Relative 1.9 0.0 - 5.0 %   Basophils Relative 0.6 0.0 - 3.0 %   Neutro Abs 4.9 1.4 - 7.7 K/uL   Lymphs Abs 2.6 0.7 - 4.0 K/uL   Monocytes Absolute  0.4 0.1 - 1.0 K/uL   Eosinophils Absolute 0.2 0.0 - 0.7 K/uL   Basophils Absolute 0.1 0.0 - 0.1 K/uL  Hepatic function panel  Result Value Ref Range   Total Bilirubin 0.6 0.2 - 1.2 mg/dL   Bilirubin, Direct 0.1 0.0 - 0.3 mg/dL   Alkaline Phosphatase 113 39 - 117 U/L   AST 13 0 - 37 U/L   ALT 14 0 - 35 U/L   Total Protein 7.0 6.0 - 8.3 g/dL   Albumin 4.2 3.5 - 5.2 g/dL  Basic metabolic panel  Result Value Ref Range   Sodium 140 135 - 145 mEq/L   Potassium 3.2 (L) 3.5 - 5.1 mEq/L   Chloride 101 96 - 112 mEq/L   CO2 30 19 - 32 mEq/L   Glucose, Bld 138 (H) 70 - 99 mg/dL   BUN 19 6 - 23 mg/dL   Creatinine, Ser 0.86 0.40 - 1.20 mg/dL   GFR 69.53 >60.00 mL/min   Calcium 9.6 8.4 - 10.5 mg/dL   No results found.  Assessment and Plan:     ICD-10-CM   1. Encounter for health maintenance examination with abnormal findings  Z00.01   2. Need for 23-polyvalent pneumococcal polysaccharide vaccine  Z23 Pneumococcal polysaccharide vaccine 23-valent greater than or equal to 2yo subcutaneous/IM  3. Trigger ring finger of left hand  M65.342 triamcinolone acetonide (KENALOG-40) injection 20 mg  4. Primary osteoarthritis of left knee  M17.12 triamcinolone acetonide (KENALOG-40) injection 40 mg   Keep working on diet, exercise and weight. Mammo and BMD are pending. DM stable  OA flare, will inject with Kenalog today Trigger finger on the 4th digit on the L  Aspiration/Injection Procedure Note Robin Bass 1951-04-22 Date of procedure: 03/11/2021  Procedure: Large Joint Aspiration / Injection of Knee, L Indications: Pain  Procedure Details Patient verbally consented to procedure. Risks, benefits, and alternatives explained. Sterilely prepped with Chloraprep. Ethyl cholride used for anesthesia. 9 cc Lidocaine 1% mixed with 1 mL of Kenalog 40 mg injected using the anteromedial approach without difficulty. No  complications with procedure and tolerated well. Patient had decreased pain  post-injection. Medication: 1 mL of Kenalog 40 mg   Tendon Sheath Injection Procedure Note Robin Bass More 01/31/1951 Date of procedure: 03/11/2021  Procedure: Tendon Sheath Injection for Trigger Finger, L 4th Indications: Pain  Procedure Details Verbal consent was obtained. Risks (including potential risk for skin lightening and potential atrophy), benefits and alternatives were discussed. Prepped with Chloraprep and Ethyl Chloride used for anesthesia. Under sterile conditions, patient injected at palmar crease aiming distally with 45 degree angle towards nodule; injected directly into tendon sheath. Medication flowed freely without resistance.  Needle size: 22 gauge 1 1/2 inch Injection: 1/2 cc of Lidocaine 1% and Kenalog 20 mg Medication: 1/2 cc of Kenalog 40 mg (equaling Kenalog 20 mg)   Health Maintenance Exam: The patient's preventative maintenance and recommended screening tests for an annual wellness exam were reviewed in full today. Brought up to date unless services declined.  Counselled on the importance of diet, exercise, and its role in overall health and mortality. The patient's FH and SH was reviewed, including their home life, tobacco status, and drug and alcohol status.  Follow-up in 1 year for physical exam or additional follow-up below.  Follow-up: 6 months  Future Appointments  Date Time Provider Department Center  04/07/2021  3:00 PM Neale BurlyMoye, [place], MD [facility] None  05/05/2021 10:00 AM [facility] CCM PHARMACIST [facility]  03/03/2022  2:00 PM [facility] NURSE HEALTH ADVISOR [facility]    Meds ordered this encounter  Medications  . triamcinolone acetonide (KENALOG-40) injection 20 mg  . triamcinolone acetonide (KENALOG-40) injection 40 mg   There are no discontinued medications. Orders Placed This Encounter  Procedures  . Pneumococcal polysaccharide vaccine 23-valent greater than or equal to 2yo subcutaneous/IM    Signed,  Kwasi Joung T. Lekita Kerekes,  MD   Allergies as of 03/11/2021      Reactions   Sulfonamide Derivatives    Ace Inhibitors Cough      Medication List       Accurate as of Mar 11, 2021 11:59 PM. If you have any questions, ask your nurse or doctor.        albuterol 108 (90 Base) MCG/ACT inhaler Commonly known as: VENTOLIN HFA Inhale 2 puffs into the lungs every 6 (six) hours as needed for wheezing or shortness of breath.   amLODipine 10 MG tablet Commonly known as: NORVASC TAKE 1 TABLET BY MOUTH ONCE A DAY   atorvastatin 20 MG tablet Commonly known as: LIPITOR TAKE 1 TABLET BY MOUTH ONCE DAILY   benzonatate 200 MG capsule Commonly known as: TESSALON Take 1 capsule (200 mg total) by mouth 3 (three) times daily as needed. Swallow whole, do not bite the pill   busPIRone 5 MG tablet Commonly known as: BUSPAR Take 1 tablet (5 mg total) by mouth 2 (two) times daily as needed.   clonazePAM 0.5 MG tablet Commonly known as: KLONOPIN Take 1 tablet (0.5 mg total) by mouth 2 (two) times daily as needed for anxiety.   diphenhydramine-acetaminophen 25-500 MG Tabs tablet Commonly known as: TYLENOL PM Take 1 tablet by mouth at bedtime.   FLUoxetine 20 MG capsule Commonly known as: PROZAC TAKE 1 CAPSULE BY MOUTH ONCE DAILY   guaiFENesin-codeine 100-10 MG/5ML syrup Commonly known as: Cheratussin AC Take 5 mLs by mouth 2 (two) times daily as needed for cough (sedation precautions).   levothyroxine 50 MCG tablet Commonly known as: SYNTHROID TAKE 1 TABLET BY MOUTH ONCE A DAY. TAKE ON AN  EMPTY STOMACH WITH A GLASS OF WATER ATLEAST 30-60 MIN BEFORE BREAKFAST   losartan-hydrochlorothiazide 100-25 MG tablet Commonly known as: HYZAAR TAKE 1 TABLET BY MOUTH ONCE DAILY   metFORMIN 500 MG 24 hr tablet Commonly known as: Glucophage XR Take 1 tablet (500 mg total) by mouth daily with breakfast.   ONE TOUCH ULTRA TEST test strip Generic drug: glucose blood USE TO CHECK BLOOD SUGAR TWICE A DAY AND AS DIRECTED    OneTouch Delica Lancets 35T Misc Use to check blood sugar two times a day   Vitamin D (Ergocalciferol) 1.25 MG (50000 UNIT) Caps capsule Commonly known as: DRISDOL Take 1 capsule (50,000 Units total) by mouth every 7 (seven) days.   Vitamin D 50 MCG (2000 UT) Caps Take 2,000 Units by mouth daily at 12 noon.

## 2021-03-12 ENCOUNTER — Encounter: Payer: Self-pay | Admitting: Family Medicine

## 2021-04-07 ENCOUNTER — Ambulatory Visit: Payer: Medicare Other | Admitting: Dermatology

## 2021-04-13 ENCOUNTER — Other Ambulatory Visit: Payer: Self-pay | Admitting: Family Medicine

## 2021-04-13 DIAGNOSIS — C44311 Basal cell carcinoma of skin of nose: Secondary | ICD-10-CM | POA: Diagnosis not present

## 2021-04-29 ENCOUNTER — Telehealth: Payer: Self-pay

## 2021-04-29 NOTE — Chronic Care Management (AMB) (Addendum)
Chronic Care Management Pharmacy Assistant   Name: Robin Bass  MRN: [id number] DOB: 11-16-1950  Reason for Encounter: CCM Appointment Reminder Call   Recent office visits:  03/11/21 - Dr.Copland, PCP - Cortisone injection left finger and left knee  Recent consult visits:  None since last CCM contact  Hospital visits:  None in previous 6 months  Medications: Outpatient Encounter Medications as of 04/29/2021  Medication Sig   albuterol (VENTOLIN HFA) 108 (90 Base) MCG/ACT inhaler Inhale 2 puffs into the lungs every 6 (six) hours as needed for wheezing or shortness of breath.   amLODipine (NORVASC) 10 MG tablet TAKE 1 TABLET BY MOUTH ONCE A DAY   atorvastatin (LIPITOR) 20 MG tablet TAKE 1 TABLET BY MOUTH ONCE DAILY   benzonatate (TESSALON) 200 MG capsule Take 1 capsule (200 mg total) by mouth 3 (three) times daily as needed. Swallow whole, do not bite the pill   busPIRone (BUSPAR) 5 MG tablet Take 1 tablet (5 mg total) by mouth 2 (two) times daily as needed.   Cholecalciferol (VITAMIN D) 50 MCG (2000 UT) CAPS Take 2,000 Units by mouth daily at 12 noon.   clonazePAM (KLONOPIN) 0.5 MG tablet Take 1 tablet (0.5 mg total) by mouth 2 (two) times daily as needed for anxiety.   diphenhydramine-acetaminophen (TYLENOL PM) 25-500 MG TABS tablet Take 1 tablet by mouth at bedtime.   FLUoxetine (PROZAC) 20 MG capsule TAKE 1 CAPSULE BY MOUTH ONCE DAILY   guaiFENesin-codeine (CHERATUSSIN AC) 100-10 MG/5ML syrup Take 5 mLs by mouth 2 (two) times daily as needed for cough (sedation precautions).   levothyroxine (SYNTHROID) 50 MCG tablet TAKE 1 TABLET BY MOUTH ONCE A DAY. TAKE ON AN EMPTY STOMACH WITH A GLASS OF WATER ATLEAST 30-60 MIN BEFORE BREAKFAST   losartan-hydrochlorothiazide (HYZAAR) 100-25 MG tablet TAKE 1 TABLET BY MOUTH ONCE DAILY   metFORMIN (GLUCOPHAGE XR) 500 MG 24 hr tablet Take 1 tablet (500 mg total) by mouth daily with breakfast.   ONE TOUCH ULTRA TEST test strip USE TO  CHECK BLOOD SUGAR TWICE A DAY AND AS DIRECTED   ONETOUCH DELICA LANCETS 75F MISC Use to check blood sugar two times a day   Vitamin D, Ergocalciferol, (DRISDOL) 1.25 MG (50000 UNIT) CAPS capsule Take 1 capsule (50,000 Units total) by mouth every 7 (seven) days.   No facility-administered encounter medications on file as of 04/29/2021.    Nilda Riggs was not available to remind her of her upcoming telephone visit with Debbora Dus on 05/05/2021 at 10:00am.  she was reminded to have all medications, supplements and any blood glucose and blood pressure readings available for review at appointment by voicemail.   Star Rating Drugs: Medication:  Last Fill: Day Supply Atorvastatin 20mg        02/10/21                    90ds Losartan 100-25mg      01/12/21          90ds Metformin XR 500mg   02/10/21                    90ds  CCM appointment on 05/05/2021   Debbora Dus, CPP notified  Avel Sensor, [place] Assistant [phone]  I have reviewed the care management and care coordination activities outlined in this encounter and I am certifying that I agree with the content of this note. No further action required.  Debbora Dus, PharmD Clinical Pharmacist [facility] at [place]

## 2021-05-05 ENCOUNTER — Telehealth: Payer: Self-pay

## 2021-05-05 ENCOUNTER — Ambulatory Visit (INDEPENDENT_AMBULATORY_CARE_PROVIDER_SITE_OTHER): Payer: Medicare Other

## 2021-05-05 ENCOUNTER — Other Ambulatory Visit: Payer: Self-pay

## 2021-05-05 DIAGNOSIS — E119 Type 2 diabetes mellitus without complications: Secondary | ICD-10-CM | POA: Diagnosis not present

## 2021-05-05 DIAGNOSIS — F32A Depression, unspecified: Secondary | ICD-10-CM

## 2021-05-05 MED ORDER — CLONAZEPAM 0.5 MG PO TABS: 0.5000 mg | ORAL_TABLET | Freq: Two times a day (BID) | ORAL | 1 refills | Status: DC | PRN

## 2021-05-05 NOTE — Telephone Encounter (Signed)
Patient is requesting a refill on clonazepam to [facility]. She uses very sparingly for anxiety. Used about twice in the past month prior to medical procedures. Continues fluoxetine 20 mg daily as prescribed.  Debbora Dus, PharmD Clinical Pharmacist [facility] at [facility] [phone]

## 2021-05-05 NOTE — Telephone Encounter (Signed)
Ok, done 

## 2021-05-05 NOTE — Progress Notes (Signed)
Chronic Care Management Pharmacy Note  05/05/21 Name:  Robin Bass MRN:  [id number] DOB:  January 30, 1951  Summary: Some difficulty with falling asleep/waking after sleep onset   Recommendations/Changes made from today's visit: Suggested try taking her Prozac in the morning   Plan: CCM call in 1 month to see how this works for her   Subjective: Robin Bass is an 70 y.o. year old female who is a primary patient of Copland, Frederico Hamman, MD.  The CCM team was consulted for assistance with disease management and care coordination needs.    Engaged with patient by telephone for follow up visit in response to provider referral for pharmacy case management and/or care coordination services.   Consent to Services:  The patient was given information about Chronic Care Management services, agreed to services, and gave verbal consent prior to initiation of services.  Please see initial visit note for detailed documentation.   Patient Care Team: Owens Loffler, MD as PCP - General  Recent office visits: 03/11/21 - Dr.Copland, PCP - Patient presented for annual exam. DM stable, A1c 7.2%. 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 Follow up 6 months.  Recent consult visits: None since last CCM contact  Hospital visits: None in previous 6 months   Objective:  Lab Results  Component Value Date   CREATININE 0.86 03/04/2021   BUN 19 03/04/2021   GFR 69.53 03/04/2021   GFRNONAA 92 07/31/2008   GFRAA 112 07/31/2008   NA 140 03/04/2021   K 3.2 (L) 03/04/2021   CALCIUM 9.6 03/04/2021   CO2 30 03/04/2021   GLUCOSE 138 (H) 03/04/2021    Lab Results  Component Value Date/Time   HGBA1C 7.2 (H) 03/04/2021 09:07 AM   HGBA1C 6.9 (A) 03/26/2020 02:13 PM   HGBA1C 7.9 (H) 12/11/2019 08:47 AM   GFR 69.53 03/04/2021 09:07 AM   GFR 77.09 12/11/2019 08:47 AM   MICROALBUR <0.7 03/04/2021 09:07 AM   MICROALBUR 1.8 12/11/2019 08:47 AM    Last  diabetic Eye exam: completed 2022 per PCP note  Last diabetic Foot exam: completed 02/2021 per PCP   Lab Results  Component Value Date   CHOL 159 03/04/2021   HDL 54.20 03/04/2021   LDLCALC 76 03/04/2021   LDLDIRECT 109.6 05/13/2013   TRIG 145.0 03/04/2021   CHOLHDL 3 03/04/2021    Hepatic Function Latest Ref Rng & Units 03/04/2021 12/11/2019 09/11/2017  Total Protein 6.0 - 8.3 g/dL 7.0 7.0 7.5  Albumin 3.5 - 5.2 g/dL 4.2 4.2 4.3  AST 0 - 37 U/L 13 13 14   ALT 0 - 35 U/L 14 13 11   Alk Phosphatase 39 - 117 U/L 113 114 102  Total Bilirubin 0.2 - 1.2 mg/dL 0.6 0.5 0.5  Bilirubin, Direct 0.0 - 0.3 mg/dL 0.1 0.1 0.0    Lab Results  Component Value Date/Time   TSH 4.23 03/04/2021 09:07 AM   TSH 3.79 03/26/2020 02:58 PM   FREET4 0.97 03/04/2021 09:07 AM   FREET4 1.16 03/26/2020 02:58 PM    CBC Latest Ref Rng & Units 03/04/2021 12/11/2019 11/15/2018  WBC 4.0 - 10.5 K/uL 8.1 8.1 6.6  Hemoglobin 12.0 - 15.0 g/dL 12.4 12.6 12.7  Hematocrit 36.0 - 46.0 % 36.7 38.2 37.5  Platelets 150.0 - 400.0 K/uL 352.0 352.0 329.0    Lab Results  Component Value Date/Time   VD25OH 35.98 03/04/2021 09:07 AM   VD25OH 46.74 03/26/2020 02:58 PM    Clinical ASCVD: No  The  10-year ASCVD risk score Mikey Bussing DC Brooke Bonito., et al., 2013) is: 16.9%   Values used to calculate the score:     Age: 35 years     Sex: Female     Is Non-Hispanic African American: No     Diabetic: Yes     Tobacco smoker: No     Systolic Blood Pressure: 032 mmHg     Is BP treated: Yes     HDL Cholesterol: 54.2 mg/dL     Total Cholesterol: 159 mg/dL    Depression screen [facility] 2/9 03/02/2021 06/08/2020 12/11/2019  Decreased Interest 0 0 0  Down, Depressed, Hopeless 0 0 0  PHQ - 2 Score 0 0 0  Altered sleeping 0 - 0  Tired, decreased energy 0 - 0  Change in appetite 0 - 0  Feeling bad or failure about yourself  0 - 0  Trouble concentrating 0 - 0  Moving slowly or fidgety/restless 0 - 0  Suicidal thoughts 0 - 0  PHQ-9 Score 0 - 0   Difficult doing work/chores Not difficult at all - Not difficult at all    Social History   Tobacco Use  Smoking Status Never  Smokeless Tobacco Never   BP Readings from Last 3 Encounters:  03/11/21 126/70  03/26/20 112/60  03/02/20 137/80   Pulse Readings from Last 3 Encounters:  03/11/21 80  03/26/20 85  03/02/20 85   Wt Readings from Last 3 Encounters:  03/11/21 175 lb (79.4 kg)  03/26/20 176 lb 6 oz (80 kg)  03/02/20 175 lb (79.4 kg)   BMI Readings from Last 3 Encounters:  03/11/21 34.75 kg/m  03/26/20 35.03 kg/m  03/02/20 33.07 kg/m    Assessment/Interventions: Review of patient past medical history, allergies, medications, health status, including review of consultants reports, laboratory and other test data, was performed as part of comprehensive evaluation and provision of chronic care management services.   SDOH:  (Social Determinants of Health) assessments and interventions performed: Yes SDOH Interventions    Flowsheet Row Most Recent Value  SDOH Interventions   Financial Strain Interventions Intervention Not Indicated      SDOH Screenings   Alcohol Screen: Low Risk    Last Alcohol Screening Score (AUDIT): 0  Depression (PHQ2-9): Low Risk    PHQ-2 Score: 0  Financial Resource Strain: Low Risk    Difficulty of Paying Living Expenses: Not very hard  Food Insecurity: No Food Insecurity   Worried About Charity fundraiser in the Last Year: Never true   Ran Out of Food in the Last Year: Never true  Housing: Low Risk    Last Housing Risk Score: 0  Physical Activity: Inactive   Days of Exercise per Week: 0 days   Minutes of Exercise per Session: 0 min  Social Connections: Not on file  Stress: Stress Concern Present   Feeling of Stress : To some extent  Tobacco Use: Low Risk    Smoking Tobacco Use: Never   Smokeless Tobacco Use: Never  Transportation Needs: No Transportation Needs   Lack of Transportation (Medical): No   Lack of Transportation  (Non-Medical): No    CCM Care Plan  Allergies  Allergen Reactions   Sulfonamide Derivatives    Ace Inhibitors Cough    Medications Reviewed Today     Reviewed by Debbora Dus, [facility] (Pharmacist) on 05/23/21 at 2304  Med List Status: <None>   Medication Order Taking? Sig Documenting Provider Last Dose Status Informant  albuterol (VENTOLIN HFA) 108 (90  Base) MCG/ACT inhaler [id number] No Inhale 2 puffs into the lungs every 6 (six) hours as needed for wheezing or shortness of breath.  Patient not taking: Reported on 05/05/2021   Ria Bush, MD Not Taking Active   amLODipine (NORVASC) 10 MG tablet [id number] Yes TAKE 1 TABLET BY MOUTH ONCE A DAY Copland, Spencer, MD Taking Active   atorvastatin (LIPITOR) 20 MG tablet [id number] Yes TAKE 1 TABLET BY MOUTH ONCE DAILY Copland, Spencer, MD Taking Active   benzonatate (TESSALON) 200 MG capsule [id number] No Take 1 capsule (200 mg total) by mouth 3 (three) times daily as needed. Swallow whole, do not bite the pill  Patient not taking: Reported on 05/05/2021   Tower, Wynelle Fanny, MD Not Taking Active   Cholecalciferol (VITAMIN D) 50 MCG (2000 UT) CAPS [id number] Yes Take 2,000 Units by mouth daily at 12 noon. [provider] Taking Active Self  clonazePAM (KLONOPIN) 0.5 MG tablet [id number]  Take 1 tablet (0.5 mg total) by mouth 2 (two) times daily as needed for anxiety. Copland, Frederico Hamman, MD  Active   diphenhydramine-acetaminophen (TYLENOL PM) 25-500 MG TABS tablet [id number] Yes Take 1 tablet by mouth at bedtime. [provider] Taking Active Self  FLUoxetine (PROZAC) 20 MG capsule [id number] Yes TAKE 1 CAPSULE BY MOUTH ONCE DAILY Copland, Spencer, MD Taking Active   guaiFENesin-codeine [facility]) 100-10 MG/5ML syrup [id number] No Take 5 mLs by mouth 2 (two) times daily as needed for cough (sedation precautions).  Patient not taking: Reported on 05/05/2021   Ria Bush, MD Not Taking Active   levothyroxine (SYNTHROID) 50  MCG tablet [id number] Yes TAKE 1 TABLET BY MOUTH ONCE A DAY. TAKE ON AN EMPTY STOMACH WITH A GLASS OF WATER ATLEAST 30-60 MIN BEFORE BREAKFAST Copland, Spencer, MD Taking Active   losartan-hydrochlorothiazide (HYZAAR) 100-25 MG tablet [id number] Yes TAKE 1 TABLET BY MOUTH ONCE DAILY Copland, Spencer, MD Taking Active   metFORMIN (GLUCOPHAGE-XR) 500 MG 24 hr tablet [id number] Yes TAKE 1 TABLET BY MOUTH ONCE DAILY WITH BREAKFAST Copland, Spencer, MD Taking Active   ONE TOUCH ULTRA TEST test strip [id number] Yes USE TO CHECK BLOOD SUGAR TWICE A DAY AND AS DIRECTED Owens Loffler, MD Taking Active   [facility] LANCETS 81M [place] [id number] Yes Use to check blood sugar two times a day Owens Loffler, MD Taking Active             Patient Active Problem List   Diagnosis Date Noted   Hyperlipidemia LDL goal <70 09/07/2015   Diabetes mellitus type 2, controlled, without complications ([place]) 67/70/3403   Depression 03/31/2012   Overactive bladder 02/05/2011   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   Hypothyroidism 07/31/2008   Essential hypertension 07/31/2008   ASTHMA 07/31/2008   COPD 07/31/2008   GERD 07/31/2008    Immunization History  Administered Date(s) Administered   Fluad Quad(high Dose 65+) 07/01/2019   Influenza Whole 07/31/2008, 07/12/2010   Influenza, Seasonal, Injecte, Preservative Fre 07/08/2015, 07/26/2016, 08/02/2017   Influenza,inj,Quad PF,6+ Mos 07/13/2018   PFIZER(Purple Top)SARS-COV-2 Vaccination 11/29/2019, 12/24/2019, 07/23/2020, 03/05/2021   Pneumococcal Conjugate-13 08/21/2014   Pneumococcal Polysaccharide-23 09/07/2015, 03/11/2021   Tdap 04/01/2014    Conditions to be addressed/monitored:  Diabetes and Depression  Care Plan : [place]  Updates made by Debbora Dus, [facility] since 05/23/2021 12:00 AM     Problem: Disease Management   Priority: High  Onset Date: 05/06/2021  Note:    Current Barriers:  None identified  Pharmacist Clinical  Goal(s):  Patient will contact  provider office for questions/concerns as evidenced notation of same in electronic health record through collaboration with PharmD and provider.   Interventions: 1:1 collaboration with Owens Loffler, MD regarding development and update of comprehensive plan of care as evidenced by provider attestation and co-signature Inter-disciplinary care team collaboration (see longitudinal plan of care) Comprehensive medication review performed; medication list updated in electronic medical record  Diabetes (A1c goal <7%) -Controlled - per home BG readings -Current medications: Metformin XR 500 mg - 1 tablet daily -Medications previously tried: none - never on higher dose of metformin -She reports at last visit with PCP they discussed increasing metformin. She has improved her diet instead. -Current home glucose  - checks BG twice a day, every other day  fasting glucose: 134 yesterday (this is high for her), usually runs < 130 in morning  post prandial glucose: < 160-180, 2 hours after dinner -Denies hypoglycemic/hyperglycemic symptoms -Current meal patterns: She reports she has attended diabetes classes and still refers back to the diabetic education packets. -Current exercise: She has not resumed Yoga or any formal exercise since COVID. She does try to get steps in, errands.   -Educated on A1c and blood sugar goals; -Counseled to check feet daily and get yearly eye exams  -Recommended to continue current medication  Depression/Anxiety (Goal: Improve mood/anxiety) -Controlled - per patient report, thought she does have some stress lately  -Current treatment: Fluoxetine 20 mg - 1 capsule daily Clonazepam 0.5 mg - 1 BID prn anxiety OTC Tylenol PM - takes 1 at bedtime for sleep -Medications previously tried/failed: Buspirone - makes her very drowsy, took 1/2 tablet unable to tolerate, she has never been on any other antidepressants or higher dose fluoxetine.  -PHQ9:  Zero (03/02/21) -GAD7: none  -She states she takes fluoxetine for depression. -She is requesting a refill on Clonazepam. She took 1/2 tablet prior to both recent procedures. She does not use it for sleep. She only takes 1/2 tablet for acute situations. She denies anxiety, more depressed.  -She tries to avoid the news, goes out to eat once a week with a friend. -She has not been keeping up with her routine care - dentist, etc since [place] and this resulted in multiple teeth pulled. Recent skin cancer found on nose. She had it removed. Navigating her transition to Medicare has also been stressful. She declines interest in changes to medications today. -Still sleeping well with Tylenol PM every night. She has struggled with sleep onset for a couple of years. Denies any next day grogginess. Feels well rested. She falls asleep fine but wakes up to use the bathroom, mind racing. Hard to fall back to sleep.  -Denies caffeine. She takes the Prozac at night.  -Educated on Benefits of cognitive-behavioral therapy with or without medication -Recommended to continue current medication; Try taking the Prozac in the morning to see if this changes your sleep patterns. Request Clonazepam PRN refill from PCP.  Patient Goals/Self-Care Activities Patient will:  - contact office with health concerns  Follow Up Plan: Telephone follow up appointment with care management team member scheduled for:  6 months; CMA follow up in 1 month for sleep/fluoxetine timing review      Medication Assistance: None required.  Patient affirms current coverage meets needs.  Compliance/Adherence/Medication fill history: Care Gaps: DEXA Scan - she is aware she is due, she will ask about it when calls for   Mammogram - encouraged scheduling, she has phone number and will call  Star-Rating Drugs: Medication:  Last Fill:         Day Supply Atorvastatin 70m       02/10/21                    90ds Losartan 100-244m     01/12/21                    90ds  -- past due  Metformin XR 50070m4/20/22                    90ds  Patient's [facility]  [facility] [id number]: [phone]: [phone] pill box? Yes Pt endorses 100% compliance  Care Plan and Follow Up Patient Decision:  Patient agrees to Care Plan and Follow-up.  MicDebbora DusharmD Clinical Pharmacist [phone]

## 2021-05-11 ENCOUNTER — Other Ambulatory Visit: Payer: Self-pay | Admitting: Family Medicine

## 2021-05-23 NOTE — Patient Instructions (Signed)
Dear Robin Bass,  Below is a summary of the goals we discussed during our follow up appointment on May 06, 2021. Please contact me anytime with questions or concerns.   Visit Information  Patient Care Plan: CCM Pharmacy Care Plan     Problem Identified: Disease Management   Priority: High  Onset Date: 05/06/2021  Note:    Current Barriers:  None identified  Pharmacist Clinical Goal(s):  Patient will contact provider office for questions/concerns as evidenced notation of same in electronic health record through collaboration with PharmD and provider.   Interventions: 1:1 collaboration with Owens Loffler, MD regarding development and update of comprehensive plan of care as evidenced by provider attestation and co-signature Inter-disciplinary care team collaboration (see longitudinal plan of care) Comprehensive medication review performed; medication list updated in electronic medical record  Diabetes (A1c goal <7%) -Controlled - per home BG readings -Current medications: Metformin XR 500 mg - 1 tablet daily -Medications previously tried: none - never on higher dose of metformin -She reports at last visit with PCP they discussed increasing metformin. She has improved her diet instead. -Current home glucose  - checks BG twice a day, every other day  fasting glucose: 134 yesterday (this is high for her), usually runs < 130 in morning  post prandial glucose: < 160-180, 2 hours after dinner -Denies hypoglycemic/hyperglycemic symptoms -Current meal patterns: She reports she has attended diabetes classes and still refers back to the diabetic education packets. -Current exercise: She has not resumed Yoga or any formal exercise since COVID. She does try to get steps in, errands.   -Educated on A1c and blood sugar goals; -Counseled to check feet daily and get yearly eye exams  -Recommended to continue current medication  Depression/Anxiety (Goal: Improve  mood/anxiety) -Controlled - per patient report, thought she does have some stress lately  -Current treatment: Fluoxetine 20 mg - 1 capsule daily Clonazepam 0.5 mg - 1 BID prn anxiety OTC Tylenol PM - takes 1 at bedtime for sleep -Medications previously tried/failed: Buspirone - makes her very drowsy, took 1/2 tablet unable to tolerate, she has never been on any other antidepressants or higher dose fluoxetine.  -PHQ9: Zero (03/02/21) -GAD7: none  -She states she takes fluoxetine for depression. -She is requesting a refill on Clonazepam. She took 1/2 tablet prior to both recent procedures. She does not use it for sleep. She only takes 1/2 tablet for acute situations. She denies anxiety, more depressed.  -She tries to avoid the news, goes out to eat once a week with a friend. -She has not been keeping up with her routine care - dentist, etc since [place] and this resulted in multiple teeth pulled. Recent skin cancer found on nose. She had it removed. Navigating her transition to Medicare has also been stressful. She declines interest in changes to medications today. -Still sleeping well with Tylenol PM every night. She has struggled with sleep onset for a couple of years. Denies any next day grogginess. Feels well rested. She falls asleep fine but wakes up to use the bathroom, mind racing. Hard to fall back to sleep.  -Denies caffeine. She takes the Prozac at night.  -Educated on Benefits of cognitive-behavioral therapy with or without medication -Recommended to continue current medication; Try taking the Prozac in the morning to see if this changes your sleep patterns. Request Clonazepam PRN refill from PCP.  Patient Goals/Self-Care Activities Patient will:  - contact office with health concerns  Follow Up Plan: Telephone follow up appointment with  care management team member scheduled for:  6 months; CMA follow up in 1 month for sleep/fluoxetine timing review      Patient verbalizes  understanding of instructions provided today and agrees to view in [place].   Debbora Dus, PharmD Clinical Pharmacist [facility] at [facility] [phone]

## 2021-05-24 ENCOUNTER — Other Ambulatory Visit: Payer: Self-pay | Admitting: Family Medicine

## 2021-06-16 ENCOUNTER — Telehealth: Payer: Self-pay

## 2021-06-16 NOTE — Chronic Care Management (AMB) (Addendum)
Chronic Care Management Pharmacy Assistant   Name: Robin Bass  MRN: [id number] DOB: 1951-01-14  Reason for Encounter: General Adherence    Recent office visits:  None since last CCM contact  Recent consult visits:  None since last CCM contact  Hospital visits:  None in previous 6 months  Medications: Outpatient Encounter Medications as of 06/16/2021  Medication Sig   albuterol (VENTOLIN HFA) 108 (90 Base) MCG/ACT inhaler Inhale 2 puffs into the lungs every 6 (six) hours as needed for wheezing or shortness of breath. (Patient not taking: Reported on 05/05/2021)   amLODipine (NORVASC) 10 MG tablet TAKE 1 TABLET BY MOUTH ONCE A DAY   atorvastatin (LIPITOR) 20 MG tablet TAKE 1 TABLET BY MOUTH ONCE DAILY   benzonatate (TESSALON) 200 MG capsule Take 1 capsule (200 mg total) by mouth 3 (three) times daily as needed. Swallow whole, do not bite the pill (Patient not taking: Reported on 05/05/2021)   Cholecalciferol (VITAMIN Bass) 50 MCG (2000 UT) CAPS Take 2,000 Units by mouth daily at 12 noon.   clonazePAM (KLONOPIN) 0.5 MG tablet Take 1 tablet (0.5 mg total) by mouth 2 (two) times daily as needed for anxiety.   diphenhydramine-acetaminophen (TYLENOL PM) 25-500 MG TABS tablet Take 1 tablet by mouth at bedtime.   FLUoxetine (PROZAC) 20 MG capsule TAKE 1 CAPSULE BY MOUTH ONCE DAILY   guaiFENesin-codeine (CHERATUSSIN AC) 100-10 MG/5ML syrup Take 5 mLs by mouth 2 (two) times daily as needed for cough (sedation precautions). (Patient not taking: Reported on 05/05/2021)   levothyroxine (SYNTHROID) 50 MCG tablet TAKE 1 TABLET BY MOUTH ONCE A DAY. TAKE ON AN EMPTY STOMACH WITH A GLASS OF WATER ATLEAST 30-60 MIN BEFORE BREAKFAST   losartan-hydrochlorothiazide (HYZAAR) 100-25 MG tablet TAKE 1 TABLET BY MOUTH ONCE DAILY   metFORMIN (GLUCOPHAGE-XR) 500 MG 24 hr tablet TAKE 1 TABLET BY MOUTH ONCE DAILY WITH BREAKFAST   ONE TOUCH ULTRA TEST test strip USE TO CHECK BLOOD SUGAR TWICE A DAY AND AS  DIRECTED   ONETOUCH DELICA LANCETS 99991111 MISC Use to check blood sugar two times a day   No facility-administered encounter medications on file as of 06/16/2021.    Contacted Robin Bass on 06/18/21 for general disease state and medication adherence call.   Patient is not > 5 days past due for refill on the following medications per chart history:  No gaps in adherence  Star Medications: Medication Name/mg Last Fill Days Supply Atorvastatin '20mg'$   05/11/21 90 Metformin XR '500mg'$   05/11/21 90 Losartan/HCTZ 100-'25mg'$  6/21//22 90  What concerns do you have about your medications? No, per last Robin Bass visit, the patient changed the time she takes her prozac. She is taking this in the am and is sleeping better.  The patient denies side effects with her medications.   How often do you forget or accidentally miss a dose? Rarely maybe 1 time in 6 months   Do you use a pillbox? Yes  Are you having any problems getting your medications from your pharmacy? No  The patient reports no current issues with [name]  Has the cost of your medications been a concern? No  Since last visit with Robin Bass, the following interventions have been made: The patient changed the time she takes her prozac and seems to make a difference for her .   The patient has not had an ED visit since last contact.   The patient denies problems with their health.   she denies  concerns  or questions for Robin Bass, Robin Bass at this time.   Counseled patient on:  [place] job taking medications, Importance of taking medication daily without missed doses, Benefits of adherence packaging or a pillbox, and Access to CCM team for any cost, medication or pharmacy concerns.   Care Gaps: Last annual wellness visit: 02/2021 If Diabetic: Last eye exam / retinopathy screening: she will schedule at eye center Last diabetic foot exam: 02/2021  No appointments scheduled within the next 30 days.  Robin Bass, Robin Bass  notified  Robin Bass, [place] Assistant [phone]  I have reviewed the care management and care coordination activities outlined in this encounter and I am certifying that I agree with the content of this note. No further action required.  Robin Bass, Robin Bass Clinical Pharmacist [facility] at [facility] [phone]

## 2021-07-19 ENCOUNTER — Other Ambulatory Visit: Payer: Self-pay | Admitting: Family Medicine

## 2021-08-06 ENCOUNTER — Telehealth: Payer: Self-pay

## 2021-08-06 NOTE — Progress Notes (Addendum)
Chronic Care Management Pharmacy Assistant   Name: Robin Bass  MRN: [id number] DOB: 26-Jun-1951  Reason for Encounter: Diabetes Disease State   Recent office visits:  None since last CCM contact  Recent consult visits:  None since last CCM contact  Hospital visits:  None in previous 6 months  Medications: Outpatient Encounter Medications as of 08/06/2021  Medication Sig   albuterol (VENTOLIN HFA) 108 (90 Base) MCG/ACT inhaler Inhale 2 puffs into the lungs every 6 (six) hours as needed for wheezing or shortness of breath. (Patient not taking: Reported on 05/05/2021)   amLODipine (NORVASC) 10 MG tablet TAKE 1 TABLET BY MOUTH ONCE A DAY   atorvastatin (LIPITOR) 20 MG tablet TAKE 1 TABLET BY MOUTH ONCE DAILY   benzonatate (TESSALON) 200 MG capsule Take 1 capsule (200 mg total) by mouth 3 (three) times daily as needed. Swallow whole, do not bite the pill (Patient not taking: Reported on 05/05/2021)   Cholecalciferol (VITAMIN D) 50 MCG (2000 UT) CAPS Take 2,000 Units by mouth daily at 12 noon.   clonazePAM (KLONOPIN) 0.5 MG tablet Take 1 tablet (0.5 mg total) by mouth 2 (two) times daily as needed for anxiety.   diphenhydramine-acetaminophen (TYLENOL PM) 25-500 MG TABS tablet Take 1 tablet by mouth at bedtime.   FLUoxetine (PROZAC) 20 MG capsule TAKE 1 CAPSULE BY MOUTH ONCE DAILY   guaiFENesin-codeine (CHERATUSSIN AC) 100-10 MG/5ML syrup Take 5 mLs by mouth 2 (two) times daily as needed for cough (sedation precautions). (Patient not taking: Reported on 05/05/2021)   levothyroxine (SYNTHROID) 50 MCG tablet TAKE 1 TABLET BY MOUTH ONCE A DAY. TAKE ON AN EMPTY STOMACH WITH A GLASS OF WATER ATLEAST 30-60 MIN BEFORE BREAKFAST   losartan-hydrochlorothiazide (HYZAAR) 100-25 MG tablet TAKE 1 TABLET BY MOUTH ONCE DAILY   metFORMIN (GLUCOPHAGE-XR) 500 MG 24 hr tablet TAKE 1 TABLET BY MOUTH ONCE DAILY WITH BREAKFAST   ONE TOUCH ULTRA TEST test strip USE TO CHECK BLOOD SUGAR TWICE A DAY AND  AS DIRECTED   ONETOUCH DELICA LANCETS 71G MISC Use to check blood sugar two times a day   No facility-administered encounter medications on file as of 08/06/2021.   Recent Relevant Labs: Lab Results  Component Value Date/Time   HGBA1C 7.2 (H) 03/04/2021 09:07 AM   HGBA1C 6.9 (A) 03/26/2020 02:13 PM   HGBA1C 7.9 (H) 12/11/2019 08:47 AM   MICROALBUR <0.7 03/04/2021 09:07 AM   MICROALBUR 1.8 12/11/2019 08:47 AM    Kidney Function Lab Results  Component Value Date/Time   CREATININE 0.86 03/04/2021 09:07 AM   CREATININE 0.75 12/11/2019 08:47 AM   GFR 69.53 03/04/2021 09:07 AM   GFRNONAA 92 07/31/2008 11:38 AM   GFRAA 112 07/31/2008 11:38 AM   Contacted patient on 08/09/2021 to discuss diabetes disease state.   Current antihyperglycemic regimen:  Metformin XR 500 mg - 1 tablet daily   Patient verbally confirms she is taking the above medications as directed. Yes  What diet changes have been made to improve diabetes control? Patient stated she watches how much sugar she eats.   What recent interventions/DTPs have been made to improve glycemic control:  No recent interventions.   Have there been any recent hospitalizations or ED visits since last visit with CPP? No  Patient denies hypoglycemic symptoms, including Pale, Sweaty, Shaky, Hungry, Nervous/irritable, and Vision changes  Patient denies hyperglycemic symptoms, including blurry vision, excessive thirst, fatigue, polyuria, and weakness  How often are you checking your blood sugar? twice daily  What are your blood sugars ranging?  Date  Morning Reading Evening Reading 09/30.  119   155 10/02  128   151 10/04  116   209 - Patient had the flu shot same day 10/06  124   138 10/08  109   206 10/10  125   144 10/14  123   131 10/16  128   145  During the week, how often does your blood glucose drop below 70? Never  Are you checking your feet daily/regularly? Yes  Adherence Review: Is the patient currently on a STATIN  medication? Yes Is the patient currently on ACE/ARB medication? Yes Does the patient have >5 day gap between last estimated fill dates? No  Care Gaps: Annual wellness visit in last year? Yes Most recent A1C reading: 7.2 on 03/04/2021 Most Recent BP reading: 126/70 on 03/11/2021  Last eye exam / retinopathy screening: Up to date Last diabetic foot exam: Up to date  Counseled patient on importance of annual eye and foot exam.   Star Rating Drugs:  Medication:  Last Fill: Day Supply Atorvastatin 20mg  05/11/2021 90 Losartan 100-25mg   07/14/2021 90  Metformin XR 500mg  05/11/2021 90 Fill dates verified with [facility]  No appointments scheduled within the next 30 days.  Debbora Dus, CPP notified  Marijean Niemann, [place] Clinical Pharmacy Assistant [phone]   I have reviewed the care management and care coordination activities outlined in this encounter and I am certifying that I agree with the content of this note. No further action required.  Debbora Dus, PharmD Clinical Pharmacist [facility] at [facility] [phone]

## 2021-09-09 DIAGNOSIS — L570 Actinic keratosis: Secondary | ICD-10-CM | POA: Diagnosis not present

## 2021-09-09 DIAGNOSIS — L578 Other skin changes due to chronic exposure to nonionizing radiation: Secondary | ICD-10-CM | POA: Diagnosis not present

## 2021-09-09 DIAGNOSIS — Z85828 Personal history of other malignant neoplasm of skin: Secondary | ICD-10-CM | POA: Diagnosis not present

## 2021-09-09 DIAGNOSIS — D1801 Hemangioma of skin and subcutaneous tissue: Secondary | ICD-10-CM | POA: Diagnosis not present

## 2021-09-29 ENCOUNTER — Telehealth (INDEPENDENT_AMBULATORY_CARE_PROVIDER_SITE_OTHER): Payer: Medicare Other | Admitting: Nurse Practitioner

## 2021-09-29 ENCOUNTER — Other Ambulatory Visit: Payer: Self-pay

## 2021-09-29 ENCOUNTER — Encounter: Payer: Self-pay | Admitting: Nurse Practitioner

## 2021-09-29 VITALS — Temp 98.7°F

## 2021-09-29 DIAGNOSIS — R051 Acute cough: Secondary | ICD-10-CM | POA: Diagnosis not present

## 2021-09-29 DIAGNOSIS — J029 Acute pharyngitis, unspecified: Secondary | ICD-10-CM | POA: Insufficient documentation

## 2021-09-29 DIAGNOSIS — G4489 Other headache syndrome: Secondary | ICD-10-CM | POA: Insufficient documentation

## 2021-09-29 LAB — POC COVID19 BINAXNOW: SARS Coronavirus 2 Ag: NEGATIVE

## 2021-09-29 MED ORDER — HYDROCODONE BIT-HOMATROP MBR 5-1.5 MG/5ML PO SOLN: 5.0000 mL | Freq: Two times a day (BID) | ORAL | 0 refills | Status: DC | PRN

## 2021-09-29 NOTE — Assessment & Plan Note (Signed)
Likely related to cough and sinus drainage.  Continue over-the-counter conservative management pending COVID-19 test.

## 2021-09-29 NOTE — Assessment & Plan Note (Signed)
Cough is burdensome we will send in some Hycodan cough syrup she can use twice daily as needed.  Is tolerated this medication in the past.  Did see she had COPD and asthma listed in her chart but is not on any inhalers currently.  Continue conservative management, pending COVID-19 test

## 2021-09-29 NOTE — Progress Notes (Signed)
Patient ID: Robin Bass, female    DOB: [birth year], 70 y.o.   MRN: [id number]  Virtual visit completed through caregiltiy, a video enabled telemedicine application. Due to national recommendations of social distancing due to COVID-19, a virtual visit is felt to be most appropriate for this patient at this time. Reviewed limitations, risks, security and privacy concerns of performing a virtual visit and the availability of in person appointments. I also reviewed that there may be a patient responsible charge related to this service. The patient agreed to proceed.   Attempted to connect via video enabled program and was unsuccessful.   Phone call last 12 mins and 30 seconds  Patient location: home Provider location: [place] at [facility], office Persons participating in this virtual visit: patient, provider   If any vitals were documented, they were collected by patient at home unless specified below.    Temp 98.7 F (37.1 C) Comment: per patient on 09/28/21   CC: Sore throat Subjective:   HPI: Robin Bass is a 70 y.o. female presenting on 09/29/2021 for Sore Throat (Lost voice on 09/26/21, then started with sore throat, cough-productive-not sure of the color, runny nose, headache. No fever, no body aches, nausea or diarrhea. Did not do a Covid test. Has been taking Mucinnex DM.)  Symptoms started on 09/26/2021 At home covid test not yet Pfizer x2 and 3 booster Daughter has been sick for approx 5 weeks Mucinnex DM OTC, helps some with cough   Relevant past medical, surgical, family and social history reviewed and updated as indicated. Interim medical history since our last visit reviewed. Allergies and medications reviewed and updated. Outpatient Medications Prior to Visit  Medication Sig Dispense Refill   amLODipine (NORVASC) 10 MG tablet TAKE 1 TABLET BY MOUTH ONCE A DAY 90 tablet 0   atorvastatin (LIPITOR) 20 MG tablet TAKE 1 TABLET BY MOUTH ONCE DAILY  90 tablet 3   Cholecalciferol (VITAMIN D) 50 MCG (2000 UT) CAPS Take 2,000 Units by mouth daily at 12 noon.     clonazePAM (KLONOPIN) 0.5 MG tablet Take 1 tablet (0.5 mg total) by mouth 2 (two) times daily as needed for anxiety. 30 tablet 1   FLUoxetine (PROZAC) 20 MG capsule TAKE 1 CAPSULE BY MOUTH ONCE DAILY 90 capsule 1   levothyroxine (SYNTHROID) 50 MCG tablet TAKE 1 TABLET BY MOUTH ONCE A DAY. TAKE ON AN EMPTY STOMACH WITH A GLASS OF WATER ATLEAST 30-60 MIN BEFORE BREAKFAST 90 tablet 3   losartan-hydrochlorothiazide (HYZAAR) 100-25 MG tablet TAKE 1 TABLET BY MOUTH ONCE DAILY 90 tablet 3   metFORMIN (GLUCOPHAGE-XR) 500 MG 24 hr tablet TAKE 1 TABLET BY MOUTH ONCE DAILY WITH BREAKFAST 90 tablet 3   ONE TOUCH ULTRA TEST test strip USE TO CHECK BLOOD SUGAR TWICE A DAY AND AS DIRECTED 100 each 6   ONETOUCH DELICA LANCETS 32R MISC Use to check blood sugar two times a day 100 each 11   Fluorouracil 5 % SOLN Apply a small amount as directed twice a day For use on face for 5 days (Patient not taking: Reported on 09/29/2021)     albuterol (VENTOLIN HFA) 108 (90 Base) MCG/ACT inhaler Inhale 2 puffs into the lungs every 6 (six) hours as needed for wheezing or shortness of breath. (Patient not taking: Reported on 05/05/2021) 1 Inhaler 2   benzonatate (TESSALON) 200 MG capsule Take 1 capsule (200 mg total) by mouth 3 (three) times daily as needed. Swallow whole, do not bite the  pill (Patient not taking: Reported on 05/05/2021) 30 capsule 1   diphenhydramine-acetaminophen (TYLENOL PM) 25-500 MG TABS tablet Take 1 tablet by mouth at bedtime.     guaiFENesin-codeine (CHERATUSSIN AC) 100-10 MG/5ML syrup Take 5 mLs by mouth 2 (two) times daily as needed for cough (sedation precautions). (Patient not taking: Reported on 05/05/2021) 120 mL 0   No facility-administered medications prior to visit.     Per HPI unless specifically indicated in ROS section below Review of Systems  Constitutional:  Positive for fatigue.  Negative for chills and fever.  HENT:  Positive for congestion, sore throat (not when she swallows. intermittent) and voice change. Negative for ear discharge, ear pain, sinus pressure and sinus pain.   Respiratory:  Positive for cough (productive). Negative for shortness of breath.   Cardiovascular:  Negative for chest pain.  Gastrointestinal:  Negative for abdominal pain, diarrhea, nausea and vomiting.  Musculoskeletal:  Negative for arthralgias and myalgias.  Neurological:  Positive for headaches.  Objective:  Temp 98.7 F (37.1 C) Comment: per patient on 09/28/21  Wt Readings from Last 3 Encounters:  03/11/21 175 lb (79.4 kg)  03/26/20 176 lb 6 oz (80 kg)  03/02/20 175 lb (79.4 kg)       Physical exam: Gen: alert, NAD, not ill appearing Pulm: speaks in complete sentences without increased work of breathing Psych: normal mood, normal thought content      Results for orders placed or performed in visit on 03/04/21  VITAMIN D 25 Hydroxy (Vit-D Deficiency, Fractures)  Result Value Ref Range   VITD 35.98 30.00 - 100.00 ng/mL  TSH  Result Value Ref Range   TSH 4.23 0.35 - 4.50 uIU/mL  T4, free  Result Value Ref Range   Free T4 0.97 0.60 - 1.60 ng/dL  T3, free  Result Value Ref Range   T3, Free 3.3 2.3 - 4.2 pg/mL  Lipid panel  Result Value Ref Range   Cholesterol 159 0 - 200 mg/dL   Triglycerides 145.0 0.0 - 149.0 mg/dL   HDL 54.20 >39.00 mg/dL   VLDL 29.0 0.0 - 40.0 mg/dL   LDL Cholesterol 76 0 - 99 mg/dL   Total CHOL/HDL Ratio 3    NonHDL 104.93   Hemoglobin A1c  Result Value Ref Range   Hgb A1c MFr Bld 7.2 (H) 4.6 - 6.5 %  Microalbumin / creatinine urine ratio  Result Value Ref Range   Microalb, Ur <0.7 0.0 - 1.9 mg/dL   Creatinine,U 121.6 mg/dL   Microalb Creat Ratio 0.6 0.0 - 30.0 mg/g  CBC with Differential/Platelet  Result Value Ref Range   WBC 8.1 4.0 - 10.5 K/uL   RBC 4.83 3.87 - 5.11 Mil/uL   Hemoglobin 12.4 12.0 - 15.0 g/dL   HCT 36.7 36.0 - 46.0 %    MCV 76.1 (L) 78.0 - 100.0 fl   MCHC 33.8 30.0 - 36.0 g/dL   RDW 15.6 (H) 11.5 - 15.5 %   Platelets 352.0 150.0 - 400.0 K/uL   Neutrophils Relative % 60.2 43.0 - 77.0 %   Lymphocytes Relative 31.8 12.0 - 46.0 %   Monocytes Relative 5.5 3.0 - 12.0 %   Eosinophils Relative 1.9 0.0 - 5.0 %   Basophils Relative 0.6 0.0 - 3.0 %   Neutro Abs 4.9 1.4 - 7.7 K/uL   Lymphs Abs 2.6 0.7 - 4.0 K/uL   Monocytes Absolute 0.4 0.1 - 1.0 K/uL   Eosinophils Absolute 0.2 0.0 - 0.7 K/uL   Basophils Absolute  0.1 0.0 - 0.1 K/uL  Hepatic function panel  Result Value Ref Range   Total Bilirubin 0.6 0.2 - 1.2 mg/dL   Bilirubin, Direct 0.1 0.0 - 0.3 mg/dL   Alkaline Phosphatase 113 39 - 117 U/L   AST 13 0 - 37 U/L   ALT 14 0 - 35 U/L   Total Protein 7.0 6.0 - 8.3 g/dL   Albumin 4.2 3.5 - 5.2 g/dL  Basic metabolic panel  Result Value Ref Range   Sodium 140 135 - 145 mEq/L   Potassium 3.2 (L) 3.5 - 5.1 mEq/L   Chloride 101 96 - 112 mEq/L   CO2 30 19 - 32 mEq/L   Glucose, Bld 138 (H) 70 - 99 mg/dL   BUN 19 6 - 23 mg/dL   Creatinine, Ser 0.86 0.40 - 1.20 mg/dL   GFR 69.53 >60.00 mL/min   Calcium 9.6 8.4 - 10.5 mg/dL   Assessment & Plan:   Problem List Items Addressed This Visit       Other   Acute cough - Primary    Cough is burdensome we will send in some Hycodan cough syrup she can use twice daily as needed.  Is tolerated this medication in the past.  Did see she had COPD and asthma listed in her chart but is not on any inhalers currently.  Continue conservative management, pending COVID-19 test      Relevant Medications   HYDROcodone bit-homatropine (HYCODAN) 5-1.5 MG/5ML syrup   Other Relevant Orders   POC COVID-19   Other headache syndrome    Over-the-counter conservative treatment.  Pending COVID-19 testing      Relevant Orders   POC COVID-19   Sore throat    Likely related to cough and sinus drainage.  Continue over-the-counter conservative management pending COVID-19 test.       Relevant Orders   POC COVID-19     No orders of the defined types were placed in this encounter.  No orders of the defined types were placed in this encounter.   I discussed the assessment and treatment plan with the patient. The patient was provided an opportunity to ask questions and all were answered. The patient agreed with the plan and demonstrated an understanding of the instructions. The patient was advised to call back or seek an in-person evaluation if the symptoms worsen or if the condition fails to improve as anticipated.  Follow up plan: No follow-ups on file.  Romilda Garret, NP

## 2021-09-29 NOTE — Assessment & Plan Note (Signed)
Over-the-counter conservative treatment.  Pending COVID-19 testing

## 2021-10-01 ENCOUNTER — Encounter: Payer: Self-pay | Admitting: Nurse Practitioner

## 2021-10-01 MED ORDER — AMOXICILLIN-POT CLAVULANATE 875-125 MG PO TABS: 1.0000 | ORAL_TABLET | Freq: Two times a day (BID) | ORAL | 0 refills | Status: DC

## 2021-10-05 ENCOUNTER — Ambulatory Visit (INDEPENDENT_AMBULATORY_CARE_PROVIDER_SITE_OTHER)
Admission: RE | Admit: 2021-10-05 | Discharge: 2021-10-05 | Disposition: A | Discharge status: Home / Self Care | Payer: Medicare Other | Source: Ambulatory Visit | Attending: Family Medicine | Admitting: Family Medicine

## 2021-10-05 ENCOUNTER — Encounter: Payer: Self-pay | Admitting: Family Medicine

## 2021-10-05 ENCOUNTER — Ambulatory Visit (INDEPENDENT_AMBULATORY_CARE_PROVIDER_SITE_OTHER): Payer: Medicare Other | Admitting: Family Medicine

## 2021-10-05 ENCOUNTER — Other Ambulatory Visit: Payer: Self-pay

## 2021-10-05 VITALS — BP 130/72 | HR 90 | Temp 98.0°F | Ht 59.5 in | Wt 176.4 lb

## 2021-10-05 DIAGNOSIS — E785 Hyperlipidemia, unspecified: Secondary | ICD-10-CM | POA: Diagnosis not present

## 2021-10-05 DIAGNOSIS — R051 Acute cough: Secondary | ICD-10-CM

## 2021-10-05 DIAGNOSIS — J209 Acute bronchitis, unspecified: Secondary | ICD-10-CM

## 2021-10-05 DIAGNOSIS — E119 Type 2 diabetes mellitus without complications: Secondary | ICD-10-CM | POA: Diagnosis not present

## 2021-10-05 DIAGNOSIS — R059 Cough, unspecified: Secondary | ICD-10-CM | POA: Diagnosis not present

## 2021-10-05 LAB — COMPREHENSIVE METABOLIC PANEL
ALT: 12 U/L (ref 0–35)
AST: 16 U/L (ref 0–37)
Albumin: 3.6 g/dL (ref 3.5–5.2)
Alkaline Phosphatase: 112 U/L (ref 39–117)
BUN: 16 mg/dL (ref 6–23)
CO2: 34 mEq/L — ABNORMAL HIGH (ref 19–32)
Calcium: 9.6 mg/dL (ref 8.4–10.5)
Chloride: 98 mEq/L (ref 96–112)
Creatinine, Ser: 0.92 mg/dL (ref 0.40–1.20)
GFR: 63.86 mL/min (ref >60.00)
Glucose, Bld: 246 mg/dL — ABNORMAL HIGH (ref 70–99)
Potassium: 3.5 mEq/L (ref 3.5–5.1)
Sodium: 138 mEq/L (ref 135–145)
Total Bilirubin: 0.3 mg/dL (ref 0.2–1.2)
Total Protein: 7.3 g/dL (ref 6.0–8.3)

## 2021-10-05 LAB — LIPID PANEL
Cholesterol: 135 mg/dL (ref 0–200)
HDL: 45.8 mg/dL (ref >39.00)
LDL Cholesterol: 56 mg/dL (ref 0–99)
NonHDL: 89.41
Total CHOL/HDL Ratio: 3
Triglycerides: 166 mg/dL — ABNORMAL HIGH (ref 0.0–149.0)
VLDL: 33.2 mg/dL (ref 0.0–40.0)

## 2021-10-05 LAB — HEMOGLOBIN A1C: Hgb A1c MFr Bld: 7.5 % — ABNORMAL HIGH (ref 4.6–6.5)

## 2021-10-05 MED ORDER — AZITHROMYCIN 250 MG PO TABS: ORAL_TABLET | ORAL | 0 refills | Status: DC

## 2021-10-05 MED ORDER — PROMETHAZINE-DM 6.25-15 MG/5ML PO SYRP: 5.0000 mL | ORAL_SOLUTION | Freq: Four times a day (QID) | ORAL | 0 refills | Status: DC | PRN

## 2021-10-05 MED ORDER — ALBUTEROL SULFATE HFA 108 (90 BASE) MCG/ACT IN AERS: 2.0000 | INHALATION_SPRAY | RESPIRATORY_TRACT | 0 refills | Status: AC | PRN

## 2021-10-05 NOTE — Assessment & Plan Note (Signed)
Lab today  Plan f/u with pcp Glucose is labile Diet not optimal (eating sandwiches)

## 2021-10-05 NOTE — Assessment & Plan Note (Signed)
Lab today  Plan f/u with pcp

## 2021-10-05 NOTE — Progress Notes (Signed)
Subjective:    Patient ID: Robin Bass, female    DOB: 06-Mar-1951, 70 y.o.   MRN: [id number]  This visit occurred during the SARS-CoV-2 public health emergency.  Safety protocols were in place, including screening questions prior to the visit, additional usage of staff PPE, and extensive cleaning of exam room while observing appropriate contact time as indicated for disinfecting solutions.   HPI 70 yo pt of [name] presents with cough (neg covid testing)  Wt Readings from Last 3 Encounters:  10/05/21 176 lb 6 oz (80 kg)  03/11/21 175 lb (79.4 kg)  03/26/20 176 lb 6 oz (80 kg)   35.03 kg/m  She was seen by NP Cable for this on 12/7 and px hycodan for cough /viral Then on 12/9 tx with augmentin when she developed more prod cough with green sputum  Still taking abx 2 doses of hycodan left   Feels tired  Limp and exhausted  Not sob , just coughing  Worse at night -keeps her up   Has nausea/occ vomiting  ? If abx makes her nauseated    Had low grade temp to start / that is better  Cough is prod of green sputum  Nasal disch has blood/also green   She did have the flu shot  Pulse ox with ambulation is 94%     She has a past h/o asthma (copd)  and DM2 She has used inhaler in the past  Lab Results  Component Value Date   HGBA1C 7.2 (H) 03/04/2021   Per pt -glucose is labile  Not low however  Patient Active Problem List   Diagnosis Date Noted   Other headache syndrome 09/29/2021   Sore throat 09/29/2021   Acute cough 03/01/2019   Hyperlipidemia LDL goal <70 09/07/2015   Diabetes mellitus type 2, controlled, without complications ([place]) 66/59/9357   Acute bronchitis 04/16/2013   Depression 03/31/2012   Overactive bladder 02/05/2011   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   Hypothyroidism 07/31/2008   Essential hypertension 07/31/2008   Asthma 07/31/2008   COPD 07/31/2008   GERD 07/31/2008   Past Medical History:  Diagnosis Date   Asthma    Basal  cell carcinoma    skin   COPD (chronic obstructive pulmonary disease) ([place])    Diabetes mellitus type 2, controlled, without complications ([place]) 01/77/9390   GERD (gastroesophageal reflux disease)    History of pituitary tumor    Hypothyroidism    Overactive bladder    Past Surgical History:  Procedure Laterality Date   ABDOMINAL HYSTERECTOMY     Social History   Tobacco Use   Smoking status: Never   Smokeless tobacco: Never  Vaping Use   Vaping Use: Never used  Substance Use Topics   Alcohol use: No   Drug use: No   Family History  Problem Relation Age of Onset   Hypertension Mother    Heart attack Father    Breast cancer Neg Hx    Allergies  Allergen Reactions   Sulfonamide Derivatives    Ace Inhibitors Cough   Current Outpatient Medications on File Prior to Visit  Medication Sig Dispense Refill   amLODipine (NORVASC) 10 MG tablet TAKE 1 TABLET BY MOUTH ONCE A DAY 90 tablet 0   atorvastatin (LIPITOR) 20 MG tablet TAKE 1 TABLET BY MOUTH ONCE DAILY 90 tablet 3   Cholecalciferol (VITAMIN D) 50 MCG (2000 UT) CAPS Take 2,000 Units by mouth daily at 12 noon.     clonazePAM (KLONOPIN) 0.5 MG  tablet Take 1 tablet (0.5 mg total) by mouth 2 (two) times daily as needed for anxiety. 30 tablet 1   Fluorouracil 5 % SOLN      FLUoxetine (PROZAC) 20 MG capsule TAKE 1 CAPSULE BY MOUTH ONCE DAILY 90 capsule 1   levothyroxine (SYNTHROID) 50 MCG tablet TAKE 1 TABLET BY MOUTH ONCE A DAY. TAKE ON AN EMPTY STOMACH WITH A GLASS OF WATER ATLEAST 30-60 MIN BEFORE BREAKFAST 90 tablet 3   losartan-hydrochlorothiazide (HYZAAR) 100-25 MG tablet TAKE 1 TABLET BY MOUTH ONCE DAILY 90 tablet 3   metFORMIN (GLUCOPHAGE-XR) 500 MG 24 hr tablet TAKE 1 TABLET BY MOUTH ONCE DAILY WITH BREAKFAST 90 tablet 3   ONE TOUCH ULTRA TEST test strip USE TO CHECK BLOOD SUGAR TWICE A DAY AND AS DIRECTED 100 each 6   ONETOUCH DELICA LANCETS 01B MISC Use to check blood sugar two times a day 100 each 11   No current  facility-administered medications on file prior to visit.     Review of Systems  Constitutional:  Positive for fatigue. Negative for activity change, appetite change, fever and unexpected weight change.  HENT:  Positive for congestion. Negative for ear pain, rhinorrhea, sinus pressure and sore throat.   Eyes:  Negative for pain, redness and visual disturbance.  Respiratory:  Positive for cough. Negative for shortness of breath, wheezing and stridor.   Cardiovascular:  Negative for chest pain and palpitations.  Gastrointestinal:  Negative for abdominal pain, blood in stool, constipation and diarrhea.  Endocrine: Negative for polydipsia and polyuria.  Genitourinary:  Negative for dysuria, frequency and urgency.  Musculoskeletal:  Negative for arthralgias, back pain and myalgias.  Skin:  Negative for pallor and rash.  Allergic/Immunologic: Negative for environmental allergies.  Neurological:  Positive for headaches. Negative for dizziness and syncope.  Hematological:  Negative for adenopathy. Does not bruise/bleed easily.  Psychiatric/Behavioral:  Negative for decreased concentration and dysphoric mood. The patient is not nervous/anxious.       Objective:   Physical Exam Constitutional:      General: She is not in acute distress.    Appearance: Normal appearance. She is well-developed. She is obese. She is not ill-appearing or diaphoretic.  HENT:     Head: Normocephalic and atraumatic.     Right Ear: Tympanic membrane, ear canal and external ear normal.     Left Ear: Tympanic membrane, ear canal and external ear normal.     Nose: Congestion present.     Mouth/Throat:     Mouth: Mucous membranes are moist.     Pharynx: Oropharynx is clear. No posterior oropharyngeal erythema.  Eyes:     General:        Right eye: No discharge.        Left eye: No discharge.     Conjunctiva/sclera: Conjunctivae normal.     Pupils: Pupils are equal, round, and reactive to light.  Neck:     Thyroid:  No thyromegaly.     Vascular: No carotid bruit or JVD.  Cardiovascular:     Rate and Rhythm: Normal rate and regular rhythm.     Heart sounds: Normal heart sounds.    No gallop.  Pulmonary:     Effort: Pulmonary effort is normal. No respiratory distress.     Breath sounds: Normal breath sounds. No wheezing or rales.     Comments: Good air exch Scant wheeze at and of forced expiration  Abdominal:     General: Bowel sounds are normal. There is no  distension or abdominal bruit.     Palpations: Abdomen is soft. There is no mass.     Tenderness: There is no abdominal tenderness.  Musculoskeletal:     Cervical back: Normal range of motion and neck supple.     Right lower leg: No edema.     Left lower leg: No edema.  Lymphadenopathy:     Cervical: No cervical adenopathy.  Skin:    General: Skin is warm and dry.     Coloration: Skin is not pale.     Findings: No rash.  Neurological:     Mental Status: She is alert.     Coordination: Coordination normal.     Deep Tendon Reflexes: Reflexes are normal and symmetric. Reflexes normal.  Psychiatric:        Mood and Affect: Mood normal.          Assessment & Plan:   Problem List Items Addressed This Visit       Respiratory   Acute bronchitis - Primary    Reviewed encounters (visit phone) with NP Cable  Some nausea and continued cough  Changed hycodan to prometh DM and augmentin to zpak to see if helpful for nausea  Albuterol prn for wheez cxr ordered  Reassuring exam  ER precautions discussed  Update if not starting to improve in a week or if worsening        Relevant Orders   DG Chest 2 View (Completed)     Endocrine   Diabetes mellitus type 2, controlled, without complications ([place]) (Chronic)    Lab today  Plan f/u with pcp Glucose is labile Diet not optimal (eating sandwiches)      Relevant Orders   Comprehensive metabolic panel   Hemoglobin A1c     Other   Hyperlipidemia LDL goal <70    Lab today  Plan f/u  with pcp      Relevant Orders   Comprehensive metabolic panel   Lipid panel   Acute cough    With bronchitis-see A/P      Relevant Orders   DG Chest 2 View (Completed)

## 2021-10-05 NOTE — Assessment & Plan Note (Signed)
Reviewed encounters (visit phone) with NP Cable  Some nausea and continued cough  Changed hycodan to prometh DM and augmentin to zpak to see if helpful for nausea  Albuterol prn for wheez cxr ordered  Reassuring exam  ER precautions discussed  Update if not starting to improve in a week or if worsening

## 2021-10-05 NOTE — Patient Instructions (Signed)
Stop augmentin  Take the zpak instead  Use inhaler as needed  Chest xray today   Try prometh DM instead of hycodan for cough  Goal is less nausea   Keep up fluids  Lab for diabetes/cholesterol today   Stop at check out to make a follow up appt with [name]

## 2021-10-05 NOTE — Assessment & Plan Note (Signed)
With bronchitis-see A/P

## 2021-10-20 ENCOUNTER — Other Ambulatory Visit: Payer: Self-pay | Admitting: Family Medicine

## 2021-10-25 ENCOUNTER — Telehealth: Payer: Medicare Other | Admitting: Physician Assistant

## 2021-10-25 DIAGNOSIS — R3989 Other symptoms and signs involving the genitourinary system: Secondary | ICD-10-CM

## 2021-10-25 MED ORDER — NITROFURANTOIN MONOHYD MACRO 100 MG PO CAPS: 100.0000 mg | ORAL_CAPSULE | Freq: Two times a day (BID) | ORAL | 0 refills | Status: DC

## 2021-10-25 NOTE — Patient Instructions (Signed)
Robin Bass, thank you for joining Mar Daring, PA-C for today's virtual visit.  While this provider is not your primary care provider (PCP), if your PCP is located in our provider database this encounter information will be shared with them immediately following your visit.  Consent: (Patient) Robin Bass provided verbal consent for this virtual visit at the beginning of the encounter.  Current Medications:  Current Outpatient Medications:    nitrofurantoin, macrocrystal-monohydrate, (MACROBID) 100 MG capsule, Take 1 capsule (100 mg total) by mouth 2 (two) times daily., Disp: 10 capsule, Rfl: 0   albuterol (VENTOLIN HFA) 108 (90 Base) MCG/ACT inhaler, Inhale 2 puffs into the lungs every 4 (four) hours as needed for wheezing., Disp: 1 each, Rfl: 0   amLODipine (NORVASC) 10 MG tablet, TAKE 1 TABLET BY MOUTH ONCE A DAY, Disp: 90 tablet, Rfl: 0   atorvastatin (LIPITOR) 20 MG tablet, TAKE 1 TABLET BY MOUTH ONCE DAILY, Disp: 90 tablet, Rfl: 3   azithromycin (ZITHROMAX Z-PAK) 250 MG tablet, Take 2 pills by mouth today and then 1 pill daily for 4 days, Disp: 6 tablet, Rfl: 0   Cholecalciferol (VITAMIN D) 50 MCG (2000 UT) CAPS, Take 2,000 Units by mouth daily at 12 noon., Disp: , Rfl:    clonazePAM (KLONOPIN) 0.5 MG tablet, Take 1 tablet (0.5 mg total) by mouth 2 (two) times daily as needed for anxiety., Disp: 30 tablet, Rfl: 1   Fluorouracil 5 % SOLN, , Disp: , Rfl:    FLUoxetine (PROZAC) 20 MG capsule, TAKE 1 CAPSULE BY MOUTH ONCE DAILY, Disp: 90 capsule, Rfl: 1   levothyroxine (SYNTHROID) 50 MCG tablet, TAKE 1 TABLET BY MOUTH ONCE A DAY. TAKE ON AN EMPTY STOMACH WITH A GLASS OF WATER ATLEAST 30-60 MIN BEFORE BREAKFAST, Disp: 90 tablet, Rfl: 3   losartan-hydrochlorothiazide (HYZAAR) 100-25 MG tablet, TAKE 1 TABLET BY MOUTH ONCE DAILY, Disp: 90 tablet, Rfl: 3   metFORMIN (GLUCOPHAGE-XR) 500 MG 24 hr tablet, TAKE 1 TABLET BY MOUTH ONCE DAILY WITH BREAKFAST, Disp: 90  tablet, Rfl: 3   ONE TOUCH ULTRA TEST test strip, USE TO CHECK BLOOD SUGAR TWICE A DAY AND AS DIRECTED, Disp: 100 each, Rfl: 6   ONETOUCH DELICA LANCETS 38V MISC, Use to check blood sugar two times a day, Disp: 100 each, Rfl: 11   promethazine-dextromethorphan (PROMETHAZINE-DM) 6.25-15 MG/5ML syrup, Take 5 mLs by mouth 4 (four) times daily as needed for cough., Disp: 118 mL, Rfl: 0   Medications ordered in this encounter:  Meds ordered this encounter  Medications   nitrofurantoin, macrocrystal-monohydrate, (MACROBID) 100 MG capsule    Sig: Take 1 capsule (100 mg total) by mouth 2 (two) times daily.    Dispense:  10 capsule    Refill:  0    Order Specific Question:   Supervising Provider    Answer:   Sabra Heck, [place]     *If you need refills on other medications prior to your next appointment, please contact your pharmacy*  Follow-Up: Call back or seek an in-person evaluation if the symptoms worsen or if the condition fails to improve as anticipated.  Other Instructions Urinary Tract Infection, Adult A urinary tract infection (UTI) is an infection of any part of the urinary tract. The urinary tract includes the kidneys, ureters, bladder, and urethra. These organs make, store, and get rid of urine in the body. An upper UTI affects the ureters and kidneys. A lower UTI affects the bladder and urethra. What are the causes? Most  urinary tract infections are caused by bacteria in your genital area around your urethra, where urine leaves your body. These bacteria grow and cause inflammation of your urinary tract. What increases the risk? You are more likely to develop this condition if: You have a urinary catheter that stays in place. You are not able to control when you urinate or have a bowel movement (incontinence). You are female and you: Use a spermicide or diaphragm for birth control. Have low estrogen levels. Are pregnant. You have certain genes that increase your risk. You are  sexually active. You take antibiotic medicines. You have a condition that causes your flow of urine to slow down, such as: An enlarged prostate, if you are female. Blockage in your urethra. A kidney stone. A nerve condition that affects your bladder control (neurogenic bladder). Not getting enough to drink, or not urinating often. You have certain medical conditions, such as: Diabetes. A weak disease-fighting system (immunesystem). Sickle cell disease. Gout. Spinal cord injury. What are the signs or symptoms? Symptoms of this condition include: Needing to urinate right away (urgency). Frequent urination. This may include small amounts of urine each time you urinate. Pain or burning with urination. Blood in the urine. Urine that smells bad or unusual. Trouble urinating. Cloudy urine. Vaginal discharge, if you are female. Pain in the abdomen or the lower back. You may also have: Vomiting or a decreased appetite. Confusion. Irritability or tiredness. A fever or chills. Diarrhea. The first symptom in older adults may be confusion. In some cases, they may not have any symptoms until the infection has worsened. How is this diagnosed? This condition is diagnosed based on your medical history and a physical exam. You may also have other tests, including: Urine tests. Blood tests. Tests for STIs (sexually transmitted infections). If you have had more than one UTI, a cystoscopy or imaging studies may be done to determine the cause of the infections. How is this treated? Treatment for this condition includes: Antibiotic medicine. Over-the-counter medicines to treat discomfort. Drinking enough water to stay hydrated. If you have frequent infections or have other conditions such as a kidney stone, you may need to see a health care provider who specializes in the urinary tract (urologist). In rare cases, urinary tract infections can cause sepsis. Sepsis is a life-threatening condition  that occurs when the body responds to an infection. Sepsis is treated in the hospital with IV antibiotics, fluids, and other medicines. Follow these instructions at home: Medicines Take over-the-counter and prescription medicines only as told by your health care provider. If you were prescribed an antibiotic medicine, take it as told by your health care provider. Do not stop using the antibiotic even if you start to feel better. General instructions Make sure you: Empty your bladder often and completely. Do not hold urine for long periods of time. Empty your bladder after sex. Wipe from front to back after urinating or having a bowel movement if you are female. Use each tissue only one time when you wipe. Drink enough fluid to keep your urine pale yellow. Keep all follow-up visits. This is important. Contact a health care provider if: Your symptoms do not get better after 1-2 days. Your symptoms go away and then return. Get help right away if: You have severe pain in your back or your lower abdomen. You have a fever or chills. You have nausea or vomiting. Summary A urinary tract infection (UTI) is an infection of any part of the urinary tract, which  includes the kidneys, ureters, bladder, and urethra. Most urinary tract infections are caused by bacteria in your genital area. Treatment for this condition often includes antibiotic medicines. If you were prescribed an antibiotic medicine, take it as told by your health care provider. Do not stop using the antibiotic even if you start to feel better. Keep all follow-up visits. This is important. This information is not intended to replace advice given to you by your health care provider. Make sure you discuss any questions you have with your health care provider. Document Revised: 05/22/2020 Document Reviewed: 05/22/2020 Elsevier Patient Education  2022 [employer].    If you have been instructed to have an in-person evaluation today at  a local Urgent Care facility, please use the link below. It will take you to a list of all of our available [facility], including address, phone number and hours of operation. Please do not delay care.  [facility]  If you or a family member do not have a primary care provider, use the link below to schedule a visit and establish care. When you choose a [facility] primary care physician or advanced practice provider, you gain a long-term partner in health. Find a Primary Care Provider  Learn more about [facility]'s in-office and virtual care options: [facility]

## 2021-10-25 NOTE — Progress Notes (Signed)
Virtual Visit Consent   Robin Bass, you are scheduled for a virtual visit with a [facility] provider today.     Just as with appointments in the office, your consent must be obtained to participate.  Your consent will be active for this visit and any virtual visit you may have with one of our providers in the next 365 days.     If you have a MyChart account, a copy of this consent can be sent to you electronically.  All virtual visits are billed to your insurance company just like a traditional visit in the office.    As this is a virtual visit, video technology does not allow for your provider to perform a traditional examination.  This may limit your provider's ability to fully assess your condition.  If your provider identifies any concerns that need to be evaluated in person or the need to arrange testing (such as labs, EKG, etc.), we will make arrangements to do so.     Although advances in technology are sophisticated, we cannot ensure that it will always work on either your end or our end.  If the connection with a video visit is poor, the visit may have to be switched to a telephone visit.  With either a video or telephone visit, we are not always able to ensure that we have a secure connection.     I need to obtain your verbal consent now.   Are you willing to proceed with your visit today?    Robin Bass has provided verbal consent on 10/25/2021 for a virtual visit (video or telephone).   Mar Daring, PA-C   Date: 10/25/2021 12:16 PM   Virtual Visit via Video Note   I, Mar Daring, connected with  Robin Bass  ([id number], 26-Aug-1951) on 10/25/21 at 12:15 PM EST by a video-enabled telemedicine application and verified that I am speaking with the correct person using two identifiers.  Location: Patient: Virtual Visit Location Patient: Home Provider: Virtual Visit Location Provider: Home Office   I discussed the limitations  of evaluation and management by telemedicine and the availability of in person appointments. The patient expressed understanding and agreed to proceed.    History of Present Illness: Robin Bass is a 71 y.o. who identifies as a female who was assigned female at birth, and is being seen today for suspected UTI.  HPI: Urinary Tract Infection  This is a new problem. The current episode started yesterday. The problem occurs every urination. The problem has been gradually worsening. The quality of the pain is described as stabbing (stabbing at the end of urination). The pain is mild. There has been no fever. Associated symptoms include frequency, hematuria, hesitancy, sweats and urgency. Pertinent negatives include no chills, discharge, flank pain, nausea or vomiting. Associated symptoms comments: General malaise. She has tried increased fluids (AZO tabs) for the symptoms. The treatment provided mild relief. Her past medical history is significant for recurrent UTIs (a couple times a year). There is no history of catheterization, kidney stones or a urological procedure.     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  Hypothyroidism 07/31/2008   Essential hypertension 07/31/2008   Asthma 07/31/2008   COPD 07/31/2008   GERD 07/31/2008    Allergies:  Allergies  Allergen Reactions   Sulfonamide Derivatives    Ace Inhibitors Cough   Medications:  Current Outpatient Medications:    nitrofurantoin, macrocrystal-monohydrate, (MACROBID) 100 MG capsule, Take 1 capsule (100 mg total) by mouth 2 (two) times daily., Disp: 10 capsule, Rfl: 0    albuterol (VENTOLIN HFA) 108 (90 Base) MCG/ACT inhaler, Inhale 2 puffs into the lungs every 4 (four) hours as needed for wheezing., Disp: 1 each, Rfl: 0   amLODipine (NORVASC) 10 MG tablet, TAKE 1 TABLET BY MOUTH ONCE A DAY, Disp: 90 tablet, Rfl: 0   atorvastatin (LIPITOR) 20 MG tablet, TAKE 1 TABLET BY MOUTH ONCE DAILY, Disp: 90 tablet, Rfl: 3   azithromycin (ZITHROMAX Z-PAK) 250 MG tablet, Take 2 pills by mouth today and then 1 pill daily for 4 days, Disp: 6 tablet, Rfl: 0   Cholecalciferol (VITAMIN D) 50 MCG (2000 UT) CAPS, Take 2,000 Units by mouth daily at 12 noon., Disp: , Rfl:    clonazePAM (KLONOPIN) 0.5 MG tablet, Take 1 tablet (0.5 mg total) by mouth 2 (two) times daily as needed for anxiety., Disp: 30 tablet, Rfl: 1   Fluorouracil 5 % SOLN, , Disp: , Rfl:    FLUoxetine (PROZAC) 20 MG capsule, TAKE 1 CAPSULE BY MOUTH ONCE DAILY, Disp: 90 capsule, Rfl: 1   levothyroxine (SYNTHROID) 50 MCG tablet, TAKE 1 TABLET BY MOUTH ONCE A DAY. TAKE ON AN EMPTY STOMACH WITH A GLASS OF WATER ATLEAST 30-60 MIN BEFORE BREAKFAST, Disp: 90 tablet, Rfl: 3   losartan-hydrochlorothiazide (HYZAAR) 100-25 MG tablet, TAKE 1 TABLET BY MOUTH ONCE DAILY, Disp: 90 tablet, Rfl: 3   metFORMIN (GLUCOPHAGE-XR) 500 MG 24 hr tablet, TAKE 1 TABLET BY MOUTH ONCE DAILY WITH BREAKFAST, Disp: 90 tablet, Rfl: 3   ONE TOUCH ULTRA TEST test strip, USE TO CHECK BLOOD SUGAR TWICE A DAY AND AS DIRECTED, Disp: 100 each, Rfl: 6   ONETOUCH DELICA LANCETS 86P MISC, Use to check blood sugar two times a day, Disp: 100 each, Rfl: 11   promethazine-dextromethorphan (PROMETHAZINE-DM) 6.25-15 MG/5ML syrup, Take 5 mLs by mouth 4 (four) times daily as needed for cough., Disp: 118 mL, Rfl: 0  Observations/Objective: Patient is well-developed, well-nourished in no acute distress.  Resting comfortably at home.  Head is normocephalic, atraumatic.  No labored breathing.  Speech is clear and coherent with logical content.  Patient is alert and  oriented at baseline.    Assessment and Plan: 1. Suspected UTI - nitrofurantoin, macrocrystal-monohydrate, (MACROBID) 100 MG capsule; Take 1 capsule (100 mg total) by mouth 2 (two) times daily.  Dispense: 10 capsule; Refill: 0  - Worsening symptoms.  - Will treat empirically with Macrobid - May continue AZO for spasm.  - Continue to push fluids.  - She is to seek in person evaluation for Urine Culture if symptoms do not improve or if they worsen.    Follow Up Instructions: I discussed the assessment and treatment plan with the patient. The patient was provided an opportunity to ask questions and all were answered. The patient agreed with the plan and demonstrated an understanding of the instructions.  A copy of instructions were sent to the patient via MyChart unless otherwise noted below.    The patient was advised to call back or seek an in-person evaluation if the symptoms worsen or if the condition fails to improve as  anticipated.  Time:  I spent 10 minutes with the patient via telehealth technology discussing the above problems/concerns.    Mar Daring, PA-C

## 2021-11-07 ENCOUNTER — Encounter: Payer: Self-pay | Admitting: Family Medicine

## 2021-11-15 ENCOUNTER — Other Ambulatory Visit: Payer: Self-pay | Admitting: Family Medicine

## 2021-11-15 NOTE — Telephone Encounter (Signed)
Please schedule CPE with fasting labs prior with Dr. Lorelei Pont.  Already has Medicare Wellness scheduled with nurse on 03/03/2022.  Please schedule CPE after that date.

## 2021-11-15 NOTE — Telephone Encounter (Signed)
LMTCB to schedule CPE AND LAB

## 2021-12-08 ENCOUNTER — Telehealth: Payer: Self-pay

## 2021-12-08 NOTE — Progress Notes (Signed)
° ° °  Chronic Care Management Pharmacy Assistant   Name: Gerene Nedd  MRN: [id number] DOB: 1951/03/07  Reason for Encounter: CCM (Appointment Reminder)   Medications: Outpatient Encounter Medications as of 12/08/2021  Medication Sig Note   albuterol (VENTOLIN HFA) 108 (90 Base) MCG/ACT inhaler Inhale 2 puffs into the lungs every 4 (four) hours as needed for wheezing.    amLODipine (NORVASC) 10 MG tablet TAKE 1 TABLET BY MOUTH ONCE A DAY    atorvastatin (LIPITOR) 20 MG tablet TAKE 1 TABLET BY MOUTH ONCE DAILY    azithromycin (ZITHROMAX Z-PAK) 250 MG tablet Take 2 pills by mouth today and then 1 pill daily for 4 days    Cholecalciferol (VITAMIN D) 50 MCG (2000 UT) CAPS Take 2,000 Units by mouth daily at 12 noon.    clonazePAM (KLONOPIN) 0.5 MG tablet Take 1 tablet (0.5 mg total) by mouth 2 (two) times daily as needed for anxiety.    Fluorouracil 5 % SOLN  09/29/2021: On hold for now   FLUoxetine (PROZAC) 20 MG capsule TAKE 1 CAPSULE BY MOUTH ONCE DAILY    levothyroxine (SYNTHROID) 50 MCG tablet TAKE 1 TABLET BY MOUTH ONCE A DAY. TAKE ON AN EMPTY STOMACH WITH A GLASS OF WATER ATLEAST 30-60 MIN BEFORE BREAKFAST    losartan-hydrochlorothiazide (HYZAAR) 100-25 MG tablet TAKE 1 TABLET BY MOUTH ONCE DAILY    metFORMIN (GLUCOPHAGE-XR) 500 MG 24 hr tablet TAKE 1 TABLET BY MOUTH ONCE DAILY WITH BREAKFAST    nitrofurantoin, macrocrystal-monohydrate, (MACROBID) 100 MG capsule Take 1 capsule (100 mg total) by mouth 2 (two) times daily.    ONE TOUCH ULTRA TEST test strip USE TO CHECK BLOOD SUGAR TWICE A DAY AND AS DIRECTED    ONETOUCH DELICA LANCETS 54S MISC Use to check blood sugar two times a day    promethazine-dextromethorphan (PROMETHAZINE-DM) 6.25-15 MG/5ML syrup Take 5 mLs by mouth 4 (four) times daily as needed for cough.    No facility-administered encounter medications on file as of 12/08/2021.   Nilda Riggs was contacted to remind of upcoming telephone visit with  Debbora Dus on 12/13/2021 at 10:00 am. Patient was reminded to have all medications, supplements and any blood glucose and blood pressure readings available for review at appointment. If unable to reach, a voicemail was left for patient.   Are you having any problems with your medications? No   Do you have any concerns you like to discuss with the pharmacist? No  Star Rating Drugs: Medication:   Last Fill: Day Supply Atorvastatin 20 mg  11/09/2021 90 Metformin 500 mg  11/09/2021 90   Losartan HCTZ 100-25 mg 10/11/2021 [place], CPP notified  Marijean Niemann, [place] Pharmacy Assistant [phone]  Time Spent: 10 Minutes

## 2021-12-13 ENCOUNTER — Ambulatory Visit (INDEPENDENT_AMBULATORY_CARE_PROVIDER_SITE_OTHER): Payer: Medicare Other

## 2021-12-13 ENCOUNTER — Other Ambulatory Visit: Payer: Self-pay

## 2021-12-13 DIAGNOSIS — F32A Depression, unspecified: Secondary | ICD-10-CM

## 2021-12-13 DIAGNOSIS — E119 Type 2 diabetes mellitus without complications: Secondary | ICD-10-CM

## 2021-12-13 NOTE — Patient Instructions (Signed)
Dear Robin Bass,  Below is a summary of the goals we discussed during our follow up appointment on December 13, 2021. Please contact me anytime with questions or concerns.   Visit Information  Patient Care Plan: CCM Pharmacy Care Plan     Problem Identified: Disease Management   Priority: High  Onset Date: 05/06/2021  Note:    Current Barriers:  None identified  Pharmacist Clinical Goal(s):  Patient will contact provider office for questions/concerns as evidenced notation of same in electronic health record through collaboration with PharmD and provider.   Interventions: 1:1 collaboration with Owens Loffler, MD regarding development and update of comprehensive plan of care as evidenced by provider attestation and co-signature Inter-disciplinary care team collaboration (see longitudinal plan of care) Comprehensive medication review performed; medication list updated in electronic medical record  Diabetes (A1c goal <7%) -Uncontrolled - A1c increased to 7.5% (09/2021).  Reports BG were elevated December- January due to illness, eating more bread. Most recently, fasting BG is 125-128. Occasional 135 in morning. None above 200 later in day.  -Current medications: Metformin XR 500 mg - 1 tablet daily -Medications previously tried: none - never on higher dose of metformin -Current home glucose  - checks BG twice a day, every other day  fasting glucose: 125-128 post prandial glucose: 170-180s -Denies hypoglycemic/hyperglycemic symptoms -Current meal patterns: She reports she has attended diabetes classes and still refers back to the diabetic education packets. She is trying to limit breads now that she is feeling better. -Current exercise: She has not resumed Yoga or any formal exercise since COVID. She does try to get steps in daily.  -Reviewed cholesterol results and BP goals with patient. Both are controlled, stable. -Recommended to continue medication; Recommend  schedule follow up with Dr. Lorelei Pont for A1c May 2023.  Depression(Goal: Improve mood -Controlled - per patient report. She states she is doing very well. Sleeping much better with fluoxetine in morning. She was able to taper off Tylenol PM. She only uses clonazepam for acute stress situations.  -Current treatment: Fluoxetine 20 mg - 1 capsule daily in morning Clonazepam 0.5 mg - 1 BID prn anxiety OTC Tylenol PM - takes 1 at bedtime for sleep -Medications previously tried/failed: Buspirone - makes her very drowsy, took 1/2 tablet unable to tolerate, she has never been on any other antidepressants or higher dose fluoxetine.  -PHQ9: Zero (03/02/21) -GAD7: none  -Recommended to continue current medication  Patient Goals/Self-Care Activities Patient will:  - contact office with health concerns  Follow Up Plan: Telephone follow up appointment with care management team member scheduled for:  - CCM PharmD follow up 12 months - [place] DM review 6 months       Patient verbalizes understanding of instructions and care plan provided today and agrees to view in [place] [facility]. Active MyChart status confirmed with patient.    Debbora Dus, PharmD Clinical Pharmacist Practitioner [facility] at [facility] [phone]

## 2021-12-13 NOTE — Progress Notes (Signed)
Chronic Care Management Pharmacy Note  12/13/2021 Name:  Kammie Scioli MRN:  [id number] DOB:  [birth year]  Summary: CCM 6 month follow up visit. Patient is doing well. Discussed diabetes, A1c increased to 7.5%. She has held off on scheduling PCP appt until back at [facility]. Reports BG were elevated December- January due to illness, eating more bread. Most recently, fasting BG is 125-128. Occasional 135 in morning. None above 200 later in day. She tries to limit snacking before bed.  She is limited bread now that she's feeling better. Discussed need to re-eval A1c around May 2023. She will schedule PCP visit for that time. Reviewed lipids, controlled. Reviewed HTN, controlled. Depression, controlled. She was able to taper off Tylenol PM. Sleeping well. No medication adherence concerns or barriers identified.  Recommendations/Changes made from today's visit: Continue to monitor home BG. Schedule follow up with PCP.  Plan: - CCM PharmD follow up 12 months - [place] DM review 6 months   Subjective: Ikia Cincotta is an 71 y.o. year old female who is a primary patient of Copland, Frederico Hamman, MD.  The CCM team was consulted for assistance with disease management and care coordination needs.    Engaged with patient by telephone for follow up visit in response to provider referral for pharmacy case management and/or care coordination services.   Consent to Services:  The patient was given information about Chronic Care Management services, agreed to services, and gave verbal consent prior to initiation of services.  Please see initial visit note for detailed documentation.   Patient Care Team: Owens Loffler, MD as PCP - General  Recent office visits: 10/25/21 - [facility] - UTI, start nitrofurantoin 100 mg BID. 10/05/21 - Loura Pardon, MD - Pt presented for acute bronchitis. Stop Augmentin. Start azithromycin. Use albuterol PRN. Chest x-ray today. Labs  - A1c and lipids. Follow up with PCP.  Recent consult visits: None since last CCM contact  Hospital visits: None in previous 6 months   Objective:  Lab Results  Component Value Date   CREATININE 0.92 10/05/2021   BUN 16 10/05/2021   GFR 63.86 10/05/2021   GFRNONAA 92 07/31/2008   GFRAA 112 07/31/2008   NA 138 10/05/2021   K 3.5 10/05/2021   CALCIUM 9.6 10/05/2021   CO2 34 (H) 10/05/2021   GLUCOSE 246 (H) 10/05/2021    Lab Results  Component Value Date/Time   HGBA1C 7.5 (H) 10/05/2021 11:48 AM   HGBA1C 7.2 (H) 03/04/2021 09:07 AM   GFR 63.86 10/05/2021 11:48 AM   GFR 69 03/04/2021 09:07 AM   MICROALBUR <0.7 03/04/2021 09:07 AM   MICROALBUR 1.8 12/11/2019 08:47 AM    Last diabetic Eye exam: completed 2022 per chart review Last diabetic Foot exam: completed 02/2021 per chart review  Lab Results  Component Value Date   CHOL 135 10/05/2021   HDL 45.80 10/05/2021   LDLCALC 56 10/05/2021   LDLDIRECT 109.6 05/13/2013   TRIG 166.0 (H) 10/05/2021   CHOLHDL 3 10/05/2021    Hepatic Function Latest Ref Rng & Units 10/05/2021 03/04/2021 12/11/2019  Total Protein 6.0 - 8.3 g/dL 7.3 7.0 7.0  Albumin 3.5 - 5.2 g/dL 3.6 4.2 4.2  AST 0 - 37 U/L _0 ALT 0 - 35 U/L _1 Alk Phosphatase 39 - 117 U/L 112 113 114  Total Bilirubin 0.2 - 1.2 mg/dL 0.3 0.6 0.5  Bilirubin, Direct 0.0 - 0.3 mg/dL - 0.1 0.1  Lab Results  Component Value Date/Time   TSH 4.23 03/04/2021 09:07 AM   TSH 3.79 03/26/2020 02:58 PM   FREET4 0.97 03/04/2021 09:07 AM   FREET4 1.16 03/26/2020 02:58 PM    CBC Latest Ref Rng & Units 03/04/2021 12/11/2019 11/15/2018  WBC 4.0 - 10.5 K/uL 8.1 8.1 6.6  Hemoglobin 12.0 - 15.0 g/dL 12.4 12.6 12.7  Hematocrit 36.0 - 46.0 % 36.7 38.2 37.5  Platelets 150.0 - 400.0 K/uL 352.0 352.0 329.0    Lab Results  Component Value Date/Time   VD25OH 35.98 03/04/2021 09:07 AM   VD25OH 46.74 03/26/2020 02:58 PM    Clinical ASCVD: No  The 10-year ASCVD risk  score (Arnett DK, et al., 2019) is: 17.6%   Values used to calculate the score:     Age: 71 years     Sex: Female     Is Non-Hispanic African American: No     Diabetic: Yes     Tobacco smoker: No     Systolic Blood Pressure: 749 mmHg     Is BP treated: Yes     HDL Cholesterol: 45.8 mg/dL     Total Cholesterol: 135 mg/dL    Depression screen [facility] 2/9 03/02/2021 06/08/2020 12/11/2019  Decreased Interest 0 0 0  Down, Depressed, Hopeless 0 0 0  PHQ - 2 Score 0 0 0  Altered sleeping 0 - 0  Tired, decreased energy 0 - 0  Change in appetite 0 - 0  Feeling bad or failure about yourself  0 - 0  Trouble concentrating 0 - 0  Moving slowly or fidgety/restless 0 - 0  Suicidal thoughts 0 - 0  PHQ-9 Score 0 - 0  Difficult doing work/chores Not difficult at all - Not difficult at all    Social History   Tobacco Use  Smoking Status Never  Smokeless Tobacco Never   BP Readings from Last 3 Encounters:  10/05/21 130/72  03/11/21 126/70  03/26/20 112/60   Pulse Readings from Last 3 Encounters:  10/05/21 90  03/11/21 80  03/26/20 85   Wt Readings from Last 3 Encounters:  10/05/21 176 lb 6 oz (80 kg)  03/11/21 175 lb (79.4 kg)  03/26/20 176 lb 6 oz (80 kg)   BMI Readings from Last 3 Encounters:  10/05/21 35.03 kg/m  03/11/21 34.75 kg/m  03/26/20 35.03 kg/m    Assessment/Interventions: Review of patient past medical history, allergies, medications, health status, including review of consultants reports, laboratory and other test data, was performed as part of comprehensive evaluation and provision of chronic care management services.   SDOH:  (Social Determinants of Health) assessments and interventions performed: No  SDOH Screenings   Alcohol Screen: Low Risk    Last Alcohol Screening Score (AUDIT): 0  Depression (PHQ2-9): Low Risk    PHQ-2 Score: 0  Financial Resource Strain: Low Risk    Difficulty of Paying Living Expenses: Not very hard  Food Insecurity: No Food Insecurity    Worried About Charity fundraiser in the Last Year: Never true   Ran Out of Food in the Last Year: Never true  Housing: Low Risk    Last Housing Risk Score: 0  Physical Activity: Inactive   Days of Exercise per Week: 0 days   Minutes of Exercise per Session: 0 min  Social Connections: Not on file  Stress: Stress Concern Present   Feeling of Stress : To some extent  Tobacco Use: Low Risk    Smoking Tobacco Use: Never  Smokeless Tobacco Use: Never   Passive Exposure: Not on file  Transportation Needs: No Transportation Needs   Lack of Transportation (Medical): No   Lack of Transportation (Non-Medical): No    CCM Care Plan  Allergies  Allergen Reactions   Sulfonamide Derivatives    Ace Inhibitors Cough    Medications Reviewed Today     Reviewed by Debbora Dus, [facility] (Pharmacist) on 12/13/21 at 1054  Med List Status: <None>   Medication Order Taking? Sig Documenting Provider Last Dose Status Informant  albuterol (VENTOLIN HFA) 108 (90 Base) MCG/ACT inhaler [id number] Yes Inhale 2 puffs into the lungs every 4 (four) hours as needed for wheezing. Tower, Wynelle Fanny, MD Taking Active   amLODipine (NORVASC) 10 MG tablet [id number] Yes TAKE 1 TABLET BY MOUTH ONCE A DAY Copland, Spencer, MD Taking Active   atorvastatin (LIPITOR) 20 MG tablet [id number] Yes TAKE 1 TABLET BY MOUTH ONCE DAILY Copland, Spencer, MD Taking Active   Cholecalciferol (VITAMIN D) 50 MCG (2000 UT) CAPS [id number] Yes Take 2,000 Units by mouth daily at 12 noon. [provider] Taking Active Self  clonazePAM (KLONOPIN) 0.5 MG tablet [id number]  Take 1 tablet (0.5 mg total) by mouth 2 (two) times daily as needed for anxiety. Owens Loffler, MD  Active   Fluorouracil 5 % SOLN [id number]   [provider]  Active            Med Note Georgiann Cocker, Weldon Picking   Wed Sep 29, 2021  8:27 AM) On hold for now  FLUoxetine (PROZAC) 20 MG capsule [id number] Yes TAKE 1 CAPSULE BY MOUTH ONCE DAILY Copland, Spencer, MD  Taking Active   levothyroxine (SYNTHROID) 50 MCG tablet [id number] Yes TAKE 1 TABLET BY MOUTH ONCE A DAY. TAKE ON AN EMPTY STOMACH WITH A GLASS OF WATER ATLEAST 30-60 MIN BEFORE BREAKFAST Copland, Spencer, MD Taking Active   losartan-hydrochlorothiazide (HYZAAR) 100-25 MG tablet [id number] Yes TAKE 1 TABLET BY MOUTH ONCE DAILY Copland, Spencer, MD Taking Active   metFORMIN (GLUCOPHAGE-XR) 500 MG 24 hr tablet [id number] Yes TAKE 1 TABLET BY MOUTH ONCE DAILY WITH BREAKFAST Copland, Spencer, MD Taking Active   ONE TOUCH ULTRA TEST test strip [id number]  USE TO CHECK BLOOD SUGAR TWICE A DAY AND AS DIRECTED Owens Loffler, MD  Active   [place] LANCETS [address] [place] [id number]  Use to check blood sugar two times a day Copland, Frederico Hamman, MD  Active             Patient Active Problem List   Diagnosis Date Noted   Hyperlipidemia LDL goal <70 09/07/2015   Diabetes mellitus type 2, controlled, without complications ([place]) 61/47/0929   Depression 03/31/2012   Overactive bladder 02/05/2011   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   Hypothyroidism 07/31/2008   Essential hypertension 07/31/2008   Asthma 07/31/2008   COPD 07/31/2008   GERD 07/31/2008   Immunization History  Administered Date(s) Administered   Fluad Quad(high Dose 65+) 07/01/2019   Influenza Whole 07/31/2008, 07/12/2010   Influenza, Seasonal, Injecte, Preservative Fre 07/08/2015, 07/26/2016, 08/02/2017   Influenza,inj,Quad PF,6+ Mos 07/13/2018   PFIZER(Purple Top)SARS-COV-2 Vaccination 11/29/2019, 12/24/2019, 07/23/2020, 03/05/2021   Pneumococcal Conjugate-13 08/21/2014   Pneumococcal Polysaccharide-23 09/07/2015, 03/11/2021   Tdap 04/01/2014   Conditions to be addressed/monitored:  Diabetes and Depression  Care Plan : [place]  Updates made by Debbora Dus, [facility] since 12/13/2021 12:00 AM     Problem: Disease Management   Priority: High  Onset Date: 05/06/2021  Note:    Current  Barriers:  None  identified  Pharmacist Clinical Goal(s):  Patient will contact provider office for questions/concerns as evidenced notation of same in electronic health record through collaboration with PharmD and provider.   Interventions: 1:1 collaboration with Owens Loffler, MD regarding development and update of comprehensive plan of care as evidenced by provider attestation and co-signature Inter-disciplinary care team collaboration (see longitudinal plan of care) Comprehensive medication review performed; medication list updated in electronic medical record  Diabetes (A1c goal <7%) -Uncontrolled - A1c increased to 7.5% (09/2021).  Reports BG were elevated December- January due to illness, eating more bread. Most recently, fasting BG is 125-128. Occasional 135 in morning. None above 200 later in day.  -Current medications: Metformin XR 500 mg - 1 tablet daily -Medications previously tried: none - never on higher dose of metformin -Current home glucose  - checks BG twice a day, every other day  fasting glucose: 125-128 post prandial glucose: 170-180s -Denies hypoglycemic/hyperglycemic symptoms -Current meal patterns: She reports she has attended diabetes classes and still refers back to the diabetic education packets. She is trying to limit breads now that she is feeling better. -Current exercise: She has not resumed Yoga or any formal exercise since COVID. She does try to get steps in daily.  -Reviewed cholesterol results and BP goals with patient. Both are controlled, stable. -Recommended to continue medication; Recommend schedule follow up with Dr. Lorelei Pont for A1c May 2023.  Depression(Goal: Improve mood -Controlled - per patient report. She states she is doing very well. Sleeping much better with fluoxetine in morning. She was able to taper off Tylenol PM. She only uses clonazepam for acute stress situations.  -Current treatment: Fluoxetine 20 mg - 1 capsule daily in morning Clonazepam 0.5 mg  - 1 BID prn anxiety OTC Tylenol PM - takes 1 at bedtime for sleep -Medications previously tried/failed: Buspirone - makes her very drowsy, took 1/2 tablet unable to tolerate, she has never been on any other antidepressants or higher dose fluoxetine.  -PHQ9: Zero (03/02/21) -GAD7: none  -Recommended to continue current medication  Patient Goals/Self-Care Activities Patient will:  - contact office with health concerns  Follow Up Plan: Telephone follow up appointment with care management team member scheduled for:  - CCM PharmD follow up 12 months - [place] DM review 6 months      Medication Assistance: None required.  Patient affirms current coverage meets needs.  Compliance/Adherence/Medication fill history: Care Gaps: She is aware and will schedule appt for the following. She was concerned about masks.  - DEXA Scan  - Mammogram   Star-Rating Drugs: Medication:                            Last Fill:         Day Supply Atorvastatin 20 mg                  11/09/2021      90 Metformin 500 mg                   11/09/2021      90                     Losartan - HCTZ 100-25 mg   10/11/2021      90  Patient's preferred pharmacy is:  [facility] [place] - [address] [place] [address] Phone: [phone] Fax: [phone]  Uses pill box? Yes Pt endorses 100% compliance  Care Plan and Follow Up Patient Decision:  Patient agrees to Care Plan and Follow-up.  Debbora Dus, PharmD Clinical Pharmacist [facility] at [facility] [phone]

## 2021-12-14 ENCOUNTER — Other Ambulatory Visit: Payer: Self-pay | Admitting: Family Medicine

## 2021-12-14 DIAGNOSIS — Z1231 Encounter for screening mammogram for malignant neoplasm of breast: Secondary | ICD-10-CM

## 2021-12-21 DIAGNOSIS — F32A Depression, unspecified: Secondary | ICD-10-CM | POA: Diagnosis not present

## 2021-12-21 DIAGNOSIS — E119 Type 2 diabetes mellitus without complications: Secondary | ICD-10-CM

## 2022-01-10 ENCOUNTER — Other Ambulatory Visit: Payer: Self-pay | Admitting: Family Medicine

## 2022-01-20 ENCOUNTER — Ambulatory Visit
Admission: RE | Admit: 2022-01-20 | Discharge: 2022-01-20 | Disposition: A | Discharge status: Home / Self Care | Payer: Medicare Other | Source: Ambulatory Visit | Attending: Family Medicine | Admitting: Family Medicine

## 2022-01-20 DIAGNOSIS — Z1231 Encounter for screening mammogram for malignant neoplasm of breast: Secondary | ICD-10-CM | POA: Insufficient documentation

## 2022-01-24 ENCOUNTER — Other Ambulatory Visit: Payer: Self-pay | Admitting: *Deleted

## 2022-01-24 MED ORDER — AMLODIPINE BESYLATE 10 MG PO TABS: 10.0000 mg | ORAL_TABLET | Freq: Every day | ORAL | 0 refills | Status: DC

## 2022-02-07 ENCOUNTER — Other Ambulatory Visit: Payer: Self-pay | Admitting: Family Medicine

## 2022-03-03 ENCOUNTER — Ambulatory Visit (INDEPENDENT_AMBULATORY_CARE_PROVIDER_SITE_OTHER): Payer: Medicare Other

## 2022-03-03 VITALS — Wt 176.0 lb

## 2022-03-03 DIAGNOSIS — Z0000 Encounter for general adult medical examination without abnormal findings: Secondary | ICD-10-CM

## 2022-03-03 DIAGNOSIS — Z780 Asymptomatic menopausal state: Secondary | ICD-10-CM

## 2022-03-03 NOTE — Progress Notes (Signed)
?Virtual Visit via Telephone Note ? ?I connected with  Robin Bass on 03/03/22 at  1:45 PM EDT by telephone and verified that I am speaking with the correct person using two identifiers. ? ?Location: ?Patient: home ?Provider: [facility] ?Persons participating in the virtual visit: patient/Nurse Health Advisor ?  ?I discussed the limitations, risks, security and privacy concerns of performing an evaluation and management service by telephone and the availability of in person appointments. The patient expressed understanding and agreed to proceed. ? ?Interactive audio and video telecommunications were attempted between this nurse and patient, however failed, due to patient having technical difficulties OR patient did not have access to video capability.  We continued and completed visit with audio only. ? ?Some vital signs may be absent or patient reported.  ? ?Robin David, LPN ? ?Subjective:  ? Robin Bass is a 71 y.o. female who presents for Medicare Annual (Subsequent) preventive examination. ? ?Review of Systems    ? ?  ? ?   ?Objective:  ?  ?There were no vitals filed for this visit. ?There is no height or weight on file to calculate BMI. ? ? ?  03/02/2021  ?  2:04 PM 03/02/2020  ? 10:06 AM 12/11/2019  ?  2:47 PM 12/02/2019  ? 10:54 AM  ?Advanced Directives  ?Does Patient Have a Medical Advance Directive? Yes No Yes No  ?Type of Paramedic of [place];Living will  [place];Living will   ?Copy of [place] in Chart? No - copy requested  No - copy requested   ? ? ?Current Medications (verified) ?Outpatient Encounter Medications as of 03/03/2022  ?Medication Sig  ? albuterol (VENTOLIN HFA) 108 (90 Base) MCG/ACT inhaler Inhale 2 puffs into the lungs every 4 (four) hours as needed for wheezing.  ? amLODipine (NORVASC) 10 MG tablet Take 1 tablet (10 mg total) by mouth daily.  ? atorvastatin (LIPITOR) 20 MG tablet TAKE 1  TABLET BY MOUTH ONCE DAILY  ? Cholecalciferol (VITAMIN D) 50 MCG (2000 UT) CAPS Take 2,000 Units by mouth daily at 12 noon.  ? clonazePAM (KLONOPIN) 0.5 MG tablet Take 1 tablet (0.5 mg total) by mouth 2 (two) times daily as needed for anxiety.  ? Fluorouracil 5 % SOLN   ? FLUoxetine (PROZAC) 20 MG capsule TAKE 1 CAPSULE BY MOUTH ONCE DAILY  ? levothyroxine (SYNTHROID) 50 MCG tablet TAKE 1 TABLET BY MOUTH ONCE A DAY. TAKE ON AN EMPTY STOMACH WITH A GLASS OF WATER ATLEAST 30-60 MIN BEFORE BREAKFAST  ? losartan-hydrochlorothiazide (HYZAAR) 100-25 MG tablet TAKE 1 TABLET BY MOUTH ONCE DAILY  ? metFORMIN (GLUCOPHAGE-XR) 500 MG 24 hr tablet TAKE 1 TABLET BY MOUTH ONCE DAILY WITH BREAKFAST  ? ONE TOUCH ULTRA TEST test strip USE TO CHECK BLOOD SUGAR TWICE A DAY AND AS DIRECTED  ? ONETOUCH DELICA LANCETS 37C MISC Use to check blood sugar two times a day  ? ?No facility-administered encounter medications on file as of 03/03/2022.  ? ? ?Allergies (verified) ?Sulfonamide derivatives and Ace inhibitors  ? ?History: ?Past Medical History:  ?Diagnosis Date  ? Asthma   ? Basal cell carcinoma   ? skin  ? COPD (chronic obstructive pulmonary disease) ([place])   ? Diabetes mellitus type 2, controlled, without complications ([place]) 58/85/0277  ? GERD (gastroesophageal reflux disease)   ? History of pituitary tumor   ? Hypothyroidism   ? Overactive bladder   ? ?Past Surgical History:  ?Procedure Laterality  Date  ? ABDOMINAL HYSTERECTOMY    ? ?Family History  ?Problem Relation Age of Onset  ? Hypertension Mother   ? Heart attack Father   ? Breast cancer Neg Hx   ? ?Social History  ? ?Socioeconomic History  ? Marital status: Married  ?  Spouse name: Not on file  ? Number of children: 1  ? Years of education: Not on file  ? Highest education level: Not on file  ?Occupational History  ? Occupation: Network engineer  ?  Employer: UNEMPLOYED  ?Tobacco Use  ? Smoking status: Never  ? Smokeless tobacco: Never  ?Vaping Use  ? Vaping Use: Never used   ?Substance and Sexual Activity  ? Alcohol use: No  ? Drug use: No  ? Sexual activity: Not on file  ?Other Topics Concern  ? Not on file  ?Social History Narrative  ? Not on file  ? ?Social Determinants of Health  ? ?Financial Resource Strain: Low Risk   ? Difficulty of Paying Living Expenses: Not very hard  ?Food Insecurity: Not on file  ?Transportation Needs: Not on file  ?Physical Activity: Not on file  ?Stress: Not on file  ?Social Connections: Not on file  ? ? ?Tobacco Counseling ?Counseling given: Not Answered ? ? ?Clinical Intake: ? ?Pre-visit preparation completed: Yes ? ?Pain : No/denies pain ? ?  ? ?Nutritional Risks: None ?Diabetes: Yes ?CBG done?: No ?Did pt. bring in CBG monitor from home?: No ? ?How often do you need to have someone help you when you read instructions, pamphlets, or other written materials from your doctor or pharmacy?: 1 - Never ? ?Diabetic?yes ?Nutrition Risk Assessment: ? ?Has the patient had any N/V/D within the last 2 months?  No  ?Does the patient have any non-healing wounds?  No  ?Has the patient had any unintentional weight loss or weight gain?  No  ? ?Diabetes: ? ?Is the patient diabetic?  Yes  ?If diabetic, was a CBG obtained today?  No  ?Did the patient bring in their glucometer from home?  No  ?How often do you monitor your CBG's? Morning and night every other day.  ? ?Financial Strains and Diabetes Management: ? ?Are you having any financial strains with the device, your supplies or your medication? No .  ?Does the patient want to be seen by Chronic Care Management for management of their diabetes?  No  ?Would the patient like to be referred to a Nutritionist or for Diabetic Management?  No  ? ?Diabetic Exams: ? ?Diabetic Eye Exam: Completed 03/11/21. . Pt has been advised about the importance in completing this exam.  ? ?Diabetic Foot Exam: Completed 03/12/21. Pt has been advised about the importance in completing this exam.   ? ? ?Interpreter Needed?: No ? ?Information  entered by :: Kirke Shaggy, LPN ? ? ?Activities of Daily Living ?   ? View : No data to display.  ?  ?  ?  ? ? ?Patient Care Team: ?Owens Loffler, MD as PCP - General ? ?Indicate any recent Medical Services you may have received from other than [facility] providers in the past year (date may be approximate). ? ?   ?Assessment:  ? This is a routine wellness examination for Robin Bass. ? ?Hearing/Vision screen ?No results found. ? ?Dietary issues and exercise activities discussed: ?  ? ? Goals Addressed   ?None ?  ? ?Depression Screen ? ?  03/02/2021  ?  2:06 PM 06/08/2020  ?  3:09 PM 12/11/2019  ?  2:50 PM 11/19/2018  ? 11:23 AM 09/11/2017  ?  9:12 AM  ?PHQ 2/9 Scores  ?PHQ - 2 Score 0 0 0 0 0  ?PHQ- 9 Score 0  0    ?  ?Fall Risk ? ?  03/02/2021  ?  2:05 PM 12/11/2019  ?  2:48 PM 11/19/2018  ? 11:23 AM  ?Fall Risk   ?Falls in the past year? 0 0 0  ?Number falls in past yr: 0 0   ?Injury with Fall? 0 0   ?Risk for fall due to : Medication side effect Medication side effect   ?Follow up Falls evaluation completed;Falls prevention discussed Falls evaluation completed;Falls prevention discussed   ? ? ?FALL RISK PREVENTION PERTAINING TO THE HOME: ? ?Any stairs in or around the home? Yes  ?If so, are there any without handrails? No  ?Home free of loose throw rugs in walkways, pet beds, electrical cords, etc? Yes  ?Adequate lighting in your home to reduce risk of falls? Yes  ? ?ASSISTIVE DEVICES UTILIZED TO PREVENT FALLS: ? ?Life alert? No  ?Use of a cane, walker or w/c? No  ?Grab bars in the bathroom? No  ?Shower chair or bench in shower? No  ?Elevated toilet seat or a handicapped toilet? No  ? ? ? ?Cognitive Function: ? ?  03/02/2021  ?  2:09 PM 12/11/2019  ?  2:52 PM  ?MMSE - Mini Mental State Exam  ?Orientation to time 5 5  ?Orientation to Place 5 5  ?Registration 3 3  ?Attention/ Calculation 5 5  ?Recall 3 3  ?Language- repeat 1 1  ? ?  ?  ? ?Immunizations ?Immunization History  ?Administered Date(s) Administered  ? Fluad Quad(high  Dose 65+) 07/01/2019  ? Influenza Whole 07/31/2008, 07/12/2010  ? Influenza, Seasonal, Injecte, Preservative Fre 07/08/2015, 07/26/2016, 08/02/2017  ? Influenza,inj,Quad PF,6+ Mos 07/13/2018  ? PFIZER(Purple Top)SARS-CO

## 2022-03-03 NOTE — Patient Instructions (Signed)
Robin Bass , ?Thank you for taking time to come for your Medicare Wellness Visit. I appreciate your ongoing commitment to your health goals. Please review the following plan we discussed and let me know if I can assist you in the future.  ? ?Screening recommendations/referrals: ?Colonoscopy: referral declined ?Mammogram: 01/20/22 ?Bone Density: referral sent ?Recommended yearly ophthalmology/optometry visit for glaucoma screening and checkup ?Recommended yearly dental visit for hygiene and checkup ? ?Vaccinations: ?Influenza vaccine: n/d ?Pneumococcal vaccine: 03/11/21 ?Tdap vaccine: 04/01/14 ?Shingles vaccine: Zostavax 02/22/11   ?Covid-19:11/29/19, 12/24/19, 07/23/20, 03/05/21 ? ?Advanced directives: yes, requested copy ? ?Conditions/risks identified: none ? ?Next appointment: Follow up in one year for your annual wellness visit 03/07/23 @ 1:15pm by phone ? ? ?Preventive Care 69 Years and Older, Female ?Preventive care refers to lifestyle choices and visits with your health care provider that can promote health and wellness. ?What does preventive care include? ?A yearly physical exam. This is also called an annual well check. ?Dental exams once or twice a year. ?Routine eye exams. Ask your health care provider how often you should have your eyes checked. ?Personal lifestyle choices, including: ?Daily care of your teeth and gums. ?Regular physical activity. ?Eating a healthy diet. ?Avoiding tobacco and drug use. ?Limiting alcohol use. ?Practicing safe sex. ?Taking low-dose aspirin every day. ?Taking vitamin and mineral supplements as recommended by your health care provider. ?What happens during an annual well check? ?The services and screenings done by your health care provider during your annual well check will depend on your age, overall health, lifestyle risk factors, and family history of disease. ?Counseling  ?Your health care provider may ask you questions about your: ?Alcohol use. ?Tobacco use. ?Drug use. ?Emotional  well-being. ?Home and relationship well-being. ?Sexual activity. ?Eating habits. ?History of falls. ?Memory and ability to understand (cognition). ?Work and work Statistician. ?Reproductive health. ?Screening  ?You may have the following tests or measurements: ?Height, weight, and BMI. ?Blood pressure. ?Lipid and cholesterol levels. These may be checked every 5 years, or more frequently if you are over 63 years old. ?Skin check. ?Lung cancer screening. You may have this screening every year starting at age 19 if you have a 30-pack-year history of smoking and currently smoke or have quit within the past 15 years. ?Fecal occult blood test (FOBT) of the stool. You may have this test every year starting at age 39. ?Flexible sigmoidoscopy or colonoscopy. You may have a sigmoidoscopy every 5 years or a colonoscopy every 10 years starting at age 26. ?Hepatitis C blood test. ?Hepatitis B blood test. ?Sexually transmitted disease (STD) testing. ?Diabetes screening. This is done by checking your blood sugar (glucose) after you have not eaten for a while (fasting). You may have this done every 1-3 years. ?Bone density scan. This is done to screen for osteoporosis. You may have this done starting at age 41. ?Mammogram. This may be done every 1-2 years. Talk to your health care provider about how often you should have regular mammograms. ?Talk with your health care provider about your test results, treatment options, and if necessary, the need for more tests. ?Vaccines  ?Your health care provider may recommend certain vaccines, such as: ?Influenza vaccine. This is recommended every year. ?Tetanus, diphtheria, and acellular pertussis (Tdap, Td) vaccine. You may need a Td booster every 10 years. ?Zoster vaccine. You may need this after age 51. ?Pneumococcal 13-valent conjugate (PCV13) vaccine. One dose is recommended after age 46. ?Pneumococcal polysaccharide (PPSV23) vaccine. One dose is recommended after age 26. ?  Talk to your  health care provider about which screenings and vaccines you need and how often you need them. ?This information is not intended to replace advice given to you by your health care provider. Make sure you discuss any questions you have with your health care provider. ?Document Released: 11/06/2015 Document Revised: 06/29/2016 Document Reviewed: 08/11/2015 ?Elsevier Interactive Patient Education ? 2017 [place]. ? ?Fall Prevention in the Home ?Falls can cause injuries. They can happen to people of all ages. There are many things you can do to make your home safe and to help prevent falls. ?What can I do on the outside of my home? ?Regularly fix the edges of walkways and driveways and fix any cracks. ?Remove anything that might make you trip as you walk through a door, such as a raised step or threshold. ?Trim any bushes or trees on the path to your home. ?Use bright outdoor lighting. ?Clear any walking paths of anything that might make someone trip, such as rocks or tools. ?Regularly check to see if handrails are loose or broken. Make sure that both sides of any steps have handrails. ?Any raised decks and porches should have guardrails on the edges. ?Have any leaves, snow, or ice cleared regularly. ?Use sand or salt on walking paths during winter. ?Clean up any spills in your garage right away. This includes oil or grease spills. ?What can I do in the bathroom? ?Use night lights. ?Install grab bars by the toilet and in the tub and shower. Do not use towel bars as grab bars. ?Use non-skid mats or decals in the tub or shower. ?If you need to sit down in the shower, use a plastic, non-slip stool. ?Keep the floor dry. Clean up any water that spills on the floor as soon as it happens. ?Remove soap buildup in the tub or shower regularly. ?Attach bath mats securely with double-sided non-slip rug tape. ?Do not have throw rugs and other things on the floor that can make you trip. ?What can I do in the bedroom? ?Use night  lights. ?Make sure that you have a light by your bed that is easy to reach. ?Do not use any sheets or blankets that are too big for your bed. They should not hang down onto the floor. ?Have a firm chair that has side arms. You can use this for support while you get dressed. ?Do not have throw rugs and other things on the floor that can make you trip. ?What can I do in the kitchen? ?Clean up any spills right away. ?Avoid walking on wet floors. ?Keep items that you use a lot in easy-to-reach places. ?If you need to reach something above you, use a strong step stool that has a grab bar. ?Keep electrical cords out of the way. ?Do not use floor polish or wax that makes floors slippery. If you must use wax, use non-skid floor wax. ?Do not have throw rugs and other things on the floor that can make you trip. ?What can I do with my stairs? ?Do not leave any items on the stairs. ?Make sure that there are handrails on both sides of the stairs and use them. Fix handrails that are broken or loose. Make sure that handrails are as long as the stairways. ?Check any carpeting to make sure that it is firmly attached to the stairs. Fix any carpet that is loose or worn. ?Avoid having throw rugs at the top or bottom of the stairs. If you do have throw  rugs, attach them to the floor with carpet tape. ?Make sure that you have a light switch at the top of the stairs and the bottom of the stairs. If you do not have them, ask someone to add them for you. ?What else can I do to help prevent falls? ?Wear shoes that: ?Do not have high heels. ?Have rubber bottoms. ?Are comfortable and fit you well. ?Are closed at the toe. Do not wear sandals. ?If you use a stepladder: ?Make sure that it is fully opened. Do not climb a closed stepladder. ?Make sure that both sides of the stepladder are locked into place. ?Ask someone to hold it for you, if possible. ?Clearly mark and make sure that you can see: ?Any grab bars or handrails. ?First and last  steps. ?Where the edge of each step is. ?Use tools that help you move around (mobility aids) if they are needed. These include: ?Canes. ?Walkers. ?Scooters. ?Crutches. ?Turn on the lights when you go into a dark are

## 2022-03-09 ENCOUNTER — Other Ambulatory Visit (INDEPENDENT_AMBULATORY_CARE_PROVIDER_SITE_OTHER): Payer: Medicare Other

## 2022-03-09 DIAGNOSIS — E785 Hyperlipidemia, unspecified: Secondary | ICD-10-CM | POA: Diagnosis not present

## 2022-03-09 DIAGNOSIS — Z79899 Other long term (current) drug therapy: Secondary | ICD-10-CM | POA: Diagnosis not present

## 2022-03-09 DIAGNOSIS — E119 Type 2 diabetes mellitus without complications: Secondary | ICD-10-CM | POA: Diagnosis not present

## 2022-03-09 DIAGNOSIS — E559 Vitamin D deficiency, unspecified: Secondary | ICD-10-CM | POA: Diagnosis not present

## 2022-03-09 DIAGNOSIS — E039 Hypothyroidism, unspecified: Secondary | ICD-10-CM | POA: Diagnosis not present

## 2022-03-09 LAB — CBC WITH DIFFERENTIAL/PLATELET
Basophils Absolute: 0.1 10*3/uL (ref 0.0–0.1)
Basophils Relative: 0.8 % (ref 0.0–3.0)
Eosinophils Absolute: 0.3 10*3/uL (ref 0.0–0.7)
Eosinophils Relative: 3.6 % (ref 0.0–5.0)
HCT: 38.1 % (ref 36.0–46.0)
Hemoglobin: 12.5 g/dL (ref 12.0–15.0)
Lymphocytes Relative: 29.3 % (ref 12.0–46.0)
Lymphs Abs: 2.3 10*3/uL (ref 0.7–4.0)
MCHC: 32.8 g/dL (ref 30.0–36.0)
MCV: 75.1 fl — ABNORMAL LOW (ref 78.0–100.0)
Monocytes Absolute: 0.5 10*3/uL (ref 0.1–1.0)
Monocytes Relative: 6.5 % (ref 3.0–12.0)
Neutro Abs: 4.6 10*3/uL (ref 1.4–7.7)
Neutrophils Relative %: 59.8 % (ref 43.0–77.0)
Platelets: 348 10*3/uL (ref 150.0–400.0)
RBC: 5.07 Mil/uL (ref 3.87–5.11)
RDW: 16.7 % — ABNORMAL HIGH (ref 11.5–15.5)
WBC: 7.7 10*3/uL (ref 4.0–10.5)

## 2022-03-09 LAB — BASIC METABOLIC PANEL
BUN: 19 mg/dL (ref 6–23)
CO2: 27 mEq/L (ref 19–32)
Calcium: 10.1 mg/dL (ref 8.4–10.5)
Chloride: 99 mEq/L (ref 96–112)
Creatinine, Ser: 0.84 mg/dL (ref 0.40–1.20)
GFR: 71.02 mL/min (ref >60.00)
Glucose, Bld: 142 mg/dL — ABNORMAL HIGH (ref 70–99)
Potassium: 3.7 mEq/L (ref 3.5–5.1)
Sodium: 138 mEq/L (ref 135–145)

## 2022-03-09 LAB — LIPID PANEL
Cholesterol: 157 mg/dL (ref 0–200)
HDL: 56.8 mg/dL (ref >39.00)
LDL Cholesterol: 79 mg/dL (ref 0–99)
NonHDL: 100.39
Total CHOL/HDL Ratio: 3
Triglycerides: 107 mg/dL (ref 0.0–149.0)
VLDL: 21.4 mg/dL (ref 0.0–40.0)

## 2022-03-09 LAB — HEMOGLOBIN A1C: Hgb A1c MFr Bld: 7.4 % — ABNORMAL HIGH (ref 4.6–6.5)

## 2022-03-09 LAB — HEPATIC FUNCTION PANEL
ALT: 13 U/L (ref 0–35)
AST: 12 U/L (ref 0–37)
Albumin: 4.4 g/dL (ref 3.5–5.2)
Alkaline Phosphatase: 123 U/L — ABNORMAL HIGH (ref 39–117)
Bilirubin, Direct: 0.1 mg/dL (ref 0.0–0.3)
Total Bilirubin: 0.5 mg/dL (ref 0.2–1.2)
Total Protein: 7.3 g/dL (ref 6.0–8.3)

## 2022-03-09 LAB — T3, FREE: T3, Free: 3.5 pg/mL (ref 2.3–4.2)

## 2022-03-09 LAB — VITAMIN D 25 HYDROXY (VIT D DEFICIENCY, FRACTURES): VITD: 44.22 ng/mL (ref 30.00–100.00)

## 2022-03-09 LAB — T4, FREE: Free T4: 1.11 ng/dL (ref 0.60–1.60)

## 2022-03-09 LAB — TSH: TSH: 5.59 u[IU]/mL — ABNORMAL HIGH (ref 0.35–5.50)

## 2022-03-14 ENCOUNTER — Encounter: Payer: Self-pay | Admitting: Family Medicine

## 2022-03-14 ENCOUNTER — Ambulatory Visit (INDEPENDENT_AMBULATORY_CARE_PROVIDER_SITE_OTHER): Payer: Medicare Other | Admitting: Family Medicine

## 2022-03-14 VITALS — BP 118/60 | HR 103 | Temp 98.6°F | Ht 59.5 in | Wt 169.3 lb

## 2022-03-14 DIAGNOSIS — Z0000 Encounter for general adult medical examination without abnormal findings: Secondary | ICD-10-CM | POA: Diagnosis not present

## 2022-03-14 DIAGNOSIS — E119 Type 2 diabetes mellitus without complications: Secondary | ICD-10-CM

## 2022-03-14 LAB — MICROALBUMIN / CREATININE URINE RATIO
Creatinine,U: 163.6 mg/dL
Microalb Creat Ratio: 0.8 mg/g (ref 0.0–30.0)
Microalb, Ur: 1.4 mg/dL (ref 0.0–1.9)

## 2022-03-14 MED ORDER — METFORMIN HCL ER 500 MG PO TB24: 1000.0000 mg | ORAL_TABLET | Freq: Every day | ORAL | 1 refills | Status: DC

## 2022-03-14 NOTE — Patient Instructions (Signed)
[facility] - go to the pharmacy.

## 2022-03-14 NOTE — Progress Notes (Signed)
Robin Bass T. Emberli Ballester, MD, [place] at [facility] [place], [address]  Phone: [phone]  FAX: [phone]  Robin Bass - 71 y.o. female  MRN [id number]  Date of Birth: Sep 28, 1951  Date: 03/14/2022  PCP: Owens Loffler, MD  Referral: Owens Loffler, MD  Chief Complaint  Patient presents with   Annual Exam    Part 2    Patient Care Team: Owens Loffler, MD as PCP - General Subjective:   Robin Bass is a 71 y.o. pleasant patient who presents with the following:  Health Maintenance Summary Reviewed and updated, unless pt declines services.  Tobacco History Reviewed. Non-smoker Alcohol: No concerns, no excessive use Exercise Habits: Some activity, rec at least 30 mins 5 times a week STD concerns: none Drug Use: None Lumps or breast concerns: no  Basic about some memory questions - question about alz memory issues Folstein 30/30   Shingrix - will get at pharmacy Colonoscopy - no Bivalent covid booster  Eye exam - due now - will get  Thyroid: No symptoms. Labs reviewed. Denies cold / heat intolerance, dry skin, hair loss. No goiter.  Slightly high tsh at 5.59 Normal t3 and t4  Lab Results  Component Value Date   TSH 5.59 (H) 03/09/2022    Diabetes Mellitus: Tolerating Medications: yes Compliance with diet: fair, Body mass index is 33.62 kg/m. Exercise: minimal / intermittent Avg blood sugars at home: not checking Foot problems: none Hypoglycemia: none No nausea, vomitting, blurred vision, polyuria.  Lab Results  Component Value Date   HGBA1C 7.4 (H) 03/09/2022   HGBA1C 7.5 (H) 10/05/2021   HGBA1C 7.2 (H) 03/04/2021   Lab Results  Component Value Date   MICROALBUR <0.7 03/04/2021   LDLCALC 79 03/09/2022   CREATININE 0.84 03/09/2022    Wt Readings from Last 3 Encounters:  03/14/22 169 lb 5 oz (76.8 kg)  03/03/22 176 lb (79.8 kg)  10/05/21 176 lb 6 oz  (80 kg)     Health Maintenance  Topic Date Due   Zoster Vaccines- Shingrix (1 of 2) Never done   COLONOSCOPY (Pts 45-59yr Insurance coverage will need to be confirmed)  Never done   DEXA SCAN  Never done   COVID-19 Vaccine (5 - Booster for Pfizer series) 04/30/2021   OPHTHALMOLOGY EXAM  03/11/2022   FOOT EXAM  03/12/2022   INFLUENZA VACCINE  05/24/2022   HEMOGLOBIN A1C  09/09/2022   MAMMOGRAM  01/21/2024   TETANUS/TDAP  04/01/2024   Pneumonia Vaccine 69 Years old  Completed   Hepatitis C Screening  Completed   HPV VACCINES  Aged Out    Immunization History  Administered Date(s) Administered   Fluad Quad(high Dose 65+) 07/01/2019   Influenza Whole 07/31/2008, 07/12/2010   Influenza, Seasonal, Injecte, Preservative Fre 07/08/2015, 07/26/2016, 08/02/2017   Influenza,inj,Quad PF,6+ Mos 07/13/2018   PFIZER(Purple Top)SARS-COV-2 Vaccination 11/29/2019, 12/24/2019, 07/23/2020, 03/05/2021   Pneumococcal Conjugate-13 08/21/2014   Pneumococcal Polysaccharide-23 09/07/2015, 03/11/2021   Tdap 04/01/2014   Patient Active Problem List   Diagnosis Date Noted   Diabetes mellitus type 2, controlled, without complications ([place] 142/68/3419   Priority: High   Hypothyroidism 07/31/2008    Priority: Medium    Essential hypertension 07/31/2008    Priority: Medium    Hyperlipidemia LDL goal <70 09/07/2015   Depression 03/31/2012   Overactive bladder 02/05/2011   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   Asthma 07/31/2008   COPD 07/31/2008   GERD 07/31/2008  Past Medical History:  Diagnosis Date   Asthma    Basal cell carcinoma    skin   COPD (chronic obstructive pulmonary disease) (HCC)    Diabetes mellitus type 2, controlled, without complications ([place]) 77/41/2878   GERD (gastroesophageal reflux disease)    History of pituitary tumor    Hypothyroidism    Overactive bladder     Past Surgical History:  Procedure Laterality Date   ABDOMINAL HYSTERECTOMY      Family History   Problem Relation Age of Onset   Hypertension Mother    Heart attack Father    Breast cancer Neg Hx     Social History   Social History Narrative   Not on file    Past Medical History, Surgical History, Social History, Family History, Problem List, Medications, and Allergies have been reviewed and updated if relevant.  Review of Systems: Pertinent positives are listed above.  Otherwise, a full 14 point review of systems has been done in full and it is negative except where it is noted positive.  Objective:   BP 118/60   Pulse (!) 103   Temp 98.6 F (37 C) (Oral)   Ht 4' 11.5" (1.511 m)   Wt 169 lb 5 oz (76.8 kg)   SpO2 95%   BMI 33.62 kg/m  Ideal Body Weight: Weight in (lb) to have BMI = 25: 125.6 No results found.    03/03/2022    1:51 PM 03/02/2021    2:06 PM 06/08/2020    3:09 PM 12/11/2019    2:50 PM 11/19/2018   11:23 AM  Depression screen PHQ 2/9  Decreased Interest 0 0 0 0 0  Down, Depressed, Hopeless 0 0 0 0 0  PHQ - 2 Score 0 0 0 0 0  Altered sleeping  0  0   Tired, decreased energy  0  0   Change in appetite  0  0   Feeling bad or failure about yourself   0  0   Trouble concentrating  0  0   Moving slowly or fidgety/restless  0  0   Suicidal thoughts  0  0   PHQ-9 Score  0  0   Difficult doing work/chores  Not difficult at all  Not difficult at all      GEN: well developed, well nourished, no acute distress Eyes: conjunctiva and lids normal, PERRLA, EOMI ENT: TM clear, nares clear, oral exam WNL Neck: supple, no lymphadenopathy, no thyromegaly, no JVD Pulm: clear to auscultation and percussion, respiratory effort normal CV: regular rate and rhythm, S1-S2, no murmur, rub or gallop, no bruits Chest: no scars, masses, no lumps BREAST: breast exam declined GI: soft, non-tender; no hepatosplenomegaly, masses; active bowel sounds all quadrants GU: GU exam declined Lymph: no cervical, axillary or inguinal adenopathy MSK: gait normal, muscle tone and  strength WNL, no joint swelling, effusions, discoloration, crepitus  SKIN: clear, good turgor, color WNL, no rashes, lesions, or ulcerations Neuro: normal mental status, normal strength, sensation, and motion Psych: alert; oriented to person, place and time, normally interactive and not anxious or depressed in appearance.  What is the (year) (season) (date) (day) (month) 5 /5  What is the (state) (country) (town) (hospital) (floor) 5/5  Remember Apple, [place], [place] - repeat them back to me 3/3  Spell W-O-R-L-D backwards?  5/5  What are the 3 objects I gave you 3/3  Language  Name pen - watch 2/2  Repeat No ifs, ands or buts? 1/1  Take a paper in your hand, fold it in half and put it on the floor.  3/3  Read and obey the following:  CLOSE YOUR EYES  1/1  Write a sentence 1/1  Copy the design 1/1  Total 30/30    All labs reviewed with patient. Results for orders placed or performed in visit on 03/09/22  Lipid panel  Result Value Ref Range   Cholesterol 157 0 - 200 mg/dL   Triglycerides 107.0 0.0 - 149.0 mg/dL   HDL 56.80 >39.00 mg/dL   VLDL 21.4 0.0 - 40.0 mg/dL   LDL Cholesterol 79 0 - 99 mg/dL   Total CHOL/HDL Ratio 3    NonHDL 100.39   Hepatic function panel  Result Value Ref Range   Total Bilirubin 0.5 0.2 - 1.2 mg/dL   Bilirubin, Direct 0.1 0.0 - 0.3 mg/dL   Alkaline Phosphatase 123 (H) 39 - 117 U/L   AST 12 0 - 37 U/L   ALT 13 0 - 35 U/L   Total Protein 7.3 6.0 - 8.3 g/dL   Albumin 4.4 3.5 - 5.2 g/dL  Basic metabolic panel  Result Value Ref Range   Sodium 138 135 - 145 mEq/L   Potassium 3.7 3.5 - 5.1 mEq/L   Chloride 99 96 - 112 mEq/L   CO2 27 19 - 32 mEq/L   Glucose, Bld 142 (H) 70 - 99 mg/dL   BUN 19 6 - 23 mg/dL   Creatinine, Ser 0.84 0.40 - 1.20 mg/dL   GFR 71.02 >60.00 mL/min   Calcium 10.1 8.4 - 10.5 mg/dL  CBC with Differential/Platelet  Result Value Ref Range   WBC 7.7 4.0 - 10.5 K/uL   RBC 5.07 3.87 - 5.11 Mil/uL   Hemoglobin 12.5 12.0  - 15.0 g/dL   HCT 38.1 36.0 - 46.0 %   MCV 75.1 (L) 78.0 - 100.0 fl   MCHC 32.8 30.0 - 36.0 g/dL   RDW 16.7 (H) 11.5 - 15.5 %   Platelets 348.0 150.0 - 400.0 K/uL   Neutrophils Relative % 59.8 43.0 - 77.0 %   Lymphocytes Relative 29.3 12.0 - 46.0 %   Monocytes Relative 6.5 3.0 - 12.0 %   Eosinophils Relative 3.6 0.0 - 5.0 %   Basophils Relative 0.8 0.0 - 3.0 %   Neutro Abs 4.6 1.4 - 7.7 K/uL   Lymphs Abs 2.3 0.7 - 4.0 K/uL   Monocytes Absolute 0.5 0.1 - 1.0 K/uL   Eosinophils Absolute 0.3 0.0 - 0.7 K/uL   Basophils Absolute 0.1 0.0 - 0.1 K/uL  Hemoglobin A1c  Result Value Ref Range   Hgb A1c MFr Bld 7.4 (H) 4.6 - 6.5 %  TSH  Result Value Ref Range   TSH 5.59 (H) 0.35 - 5.50 uIU/mL  T3, free  Result Value Ref Range   T3, Free 3.5 2.3 - 4.2 pg/mL  T4, free  Result Value Ref Range   Free T4 1.11 0.60 - 1.60 ng/dL  VITAMIN D 25 Hydroxy (Vit-D Deficiency, Fractures)  Result Value Ref Range   VITD 44.22 30.00 - 100.00 ng/mL   No results found.  Assessment and Plan:     ICD-10-CM   1. Healthcare maintenance  Z00.00     2. Controlled type 2 diabetes mellitus without complication, without long-term current use of insulin (HCC)  E11.9 Microalbumin / creatinine urine ratio     Mostly UTD Will get Shingrix at pharmacy Declines Colon Will get Covid booster  No changes to thyroid medication  A1c is somewhat high today, will increase metformin to 2 tablets  Health Maintenance Exam: The patient's preventative maintenance and recommended screening tests for an annual wellness exam were reviewed in full today. Brought up to date unless services declined.  Counselled on the importance of diet, exercise, and its role in overall health and mortality. The patient's FH and SH was reviewed, including their home life, tobacco status, and drug and alcohol status.  Follow-up in 1 year for physical exam or additional follow-up below.  Follow-up: Return in about 6 months (around  09/14/2022) for diabetes follow-up. Or follow-up in 1 year if not noted.  Future Appointments  Date Time Provider Orr  03/24/2022  2:20 PM Owens Loffler, MD [facility]  12/14/2022  8:45 AM [facility] CCM PHARMACIST [facility]  03/07/2023  1:15 PM [facility] NURSE HEALTH ADVISOR [facility]    Meds ordered this encounter  Medications   metFORMIN (GLUCOPHAGE-XR) 500 MG 24 hr tablet    Sig: Take 2 tablets (1,000 mg total) by mouth daily with breakfast.    Dispense:  180 tablet    Refill:  1   Medications Discontinued During This Encounter  Medication Reason   Fluorouracil 5 % SOLN Completed Course   metFORMIN (GLUCOPHAGE-XR) 500 MG 24 hr tablet Reorder   No orders of the defined types were placed in this encounter.   Signed,  [name]. Saydi Kobel, MD   Allergies as of 03/14/2022       Reactions   Sulfonamide Derivatives    Ace Inhibitors Cough        Medication List        Accurate as of Mar 14, 2022  2:03 PM. If you have any questions, ask your nurse or doctor.          STOP taking these medications    Fluorouracil 5 % Soln Stopped by: Owens Loffler, MD       TAKE these medications    albuterol 108 (90 Base) MCG/ACT inhaler Commonly known as: VENTOLIN HFA Inhale 2 puffs into the lungs every 4 (four) hours as needed for wheezing.   amLODipine 10 MG tablet Commonly known as: NORVASC Take 1 tablet (10 mg total) by mouth daily.   atorvastatin 20 MG tablet Commonly known as: LIPITOR TAKE 1 TABLET BY MOUTH ONCE DAILY   clonazePAM 0.5 MG tablet Commonly known as: KLONOPIN Take 1 tablet (0.5 mg total) by mouth 2 (two) times daily as needed for anxiety.   FLUoxetine 20 MG capsule Commonly known as: PROZAC TAKE 1 CAPSULE BY MOUTH ONCE DAILY   levothyroxine 50 MCG tablet Commonly known as: SYNTHROID TAKE 1 TABLET BY MOUTH ONCE A DAY. TAKE ON AN EMPTY STOMACH WITH A GLASS OF WATER ATLEAST 30-60 MIN BEFORE BREAKFAST    losartan-hydrochlorothiazide 100-25 MG tablet Commonly known as: HYZAAR TAKE 1 TABLET BY MOUTH ONCE DAILY   metFORMIN 500 MG 24 hr tablet Commonly known as: GLUCOPHAGE-XR Take 2 tablets (1,000 mg total) by mouth daily with breakfast. What changed: how much to take Changed by: Owens Loffler, MD   ONE TOUCH ULTRA TEST test strip Generic drug: glucose blood USE TO CHECK BLOOD SUGAR TWICE A DAY AND AS DIRECTED   OneTouch Delica Lancets 25E Misc Use to check blood sugar two times a day   Vitamin D 50 MCG (2000 UT) Caps Take 2,000 Units by mouth daily at 12 noon.

## 2022-03-15 ENCOUNTER — Encounter: Payer: Self-pay | Admitting: *Deleted

## 2022-03-24 ENCOUNTER — Ambulatory Visit (INDEPENDENT_AMBULATORY_CARE_PROVIDER_SITE_OTHER): Payer: Medicare Other | Admitting: Family Medicine

## 2022-03-24 ENCOUNTER — Encounter: Payer: Self-pay | Admitting: Family Medicine

## 2022-03-24 VITALS — BP 120/60 | HR 85 | Temp 98.5°F | Ht 59.5 in | Wt 172.2 lb

## 2022-03-24 DIAGNOSIS — M1712 Unilateral primary osteoarthritis, left knee: Secondary | ICD-10-CM | POA: Diagnosis not present

## 2022-03-24 MED ORDER — TRIAMCINOLONE ACETONIDE 40 MG/ML IJ SUSP
40.0000 mg | Freq: Once | INTRAMUSCULAR | Status: AC
  Administered 2022-03-24: 40 mg via INTRA_ARTICULAR

## 2022-03-24 NOTE — Addendum Note (Signed)
Addended by: Carter Kitten on: 03/24/2022 03:07 PM   Modules accepted: Orders

## 2022-03-24 NOTE — Progress Notes (Signed)
    Robin Wolff T. Lisbeth Puller, MD, [place] at [facility] [place], [address]  Phone: [phone]  FAX: [phone]  Robin Bass - 71 y.o. female  MRN [id number]  Date of Birth: [birth year]  Date: 03/24/2022  PCP: Owens Loffler, MD  Referral: Owens Loffler, MD  Chief Complaint  Patient presents with   Knee Pain   Subjective:   Robin Bass is a 71 y.o. very pleasant female patient with Body mass index is 34.21 kg/m. who presents with the following:  L knee OA:  Procedure only, her knee is flared up.  We talked about this at her last office visit.    ICD-10-CM   1. Primary osteoarthritis of left knee  M17.12       Aspiration/Injection Procedure Note Robin Bass [birth year] Date of procedure: 03/24/2022  Procedure: Large Joint Aspiration / Injection of Knee, L Indications: Pain  Procedure Details Patient verbally consented to procedure. Risks, benefits, and alternatives explained. Sterilely prepped with Chloraprep. Ethyl cholride used for anesthesia. 9 cc Lidocaine 1% mixed with 1 mL of Kenalog 40 mg injected using the anteromedial approach without difficulty. No complications with procedure and tolerated well. Patient had decreased pain post-injection. Medication: 1 mL of Kenalog 40 mg

## 2022-04-04 DIAGNOSIS — D1801 Hemangioma of skin and subcutaneous tissue: Secondary | ICD-10-CM | POA: Diagnosis not present

## 2022-04-04 DIAGNOSIS — D3612 Benign neoplasm of peripheral nerves and autonomic nervous system, upper limb, including shoulder: Secondary | ICD-10-CM | POA: Diagnosis not present

## 2022-04-04 DIAGNOSIS — Z85828 Personal history of other malignant neoplasm of skin: Secondary | ICD-10-CM | POA: Diagnosis not present

## 2022-04-04 DIAGNOSIS — L918 Other hypertrophic disorders of the skin: Secondary | ICD-10-CM | POA: Diagnosis not present

## 2022-04-04 DIAGNOSIS — L723 Sebaceous cyst: Secondary | ICD-10-CM | POA: Diagnosis not present

## 2022-04-04 DIAGNOSIS — L7211 Pilar cyst: Secondary | ICD-10-CM | POA: Diagnosis not present

## 2022-04-04 DIAGNOSIS — L821 Other seborrheic keratosis: Secondary | ICD-10-CM | POA: Diagnosis not present

## 2022-04-04 DIAGNOSIS — L720 Epidermal cyst: Secondary | ICD-10-CM | POA: Diagnosis not present

## 2022-04-04 DIAGNOSIS — L570 Actinic keratosis: Secondary | ICD-10-CM | POA: Diagnosis not present

## 2022-04-18 ENCOUNTER — Other Ambulatory Visit: Payer: Self-pay | Admitting: Family Medicine

## 2022-05-09 ENCOUNTER — Encounter: Payer: Self-pay | Admitting: Emergency Medicine

## 2022-05-09 ENCOUNTER — Ambulatory Visit
Admission: EM | Admit: 2022-05-09 | Discharge: 2022-05-09 | Disposition: A | Discharge status: Home / Self Care | Payer: Medicare Other | Attending: Emergency Medicine | Admitting: Emergency Medicine

## 2022-05-09 DIAGNOSIS — R350 Frequency of micturition: Secondary | ICD-10-CM | POA: Insufficient documentation

## 2022-05-09 LAB — POCT URINALYSIS DIP (MANUAL ENTRY)
Bilirubin, UA: NEGATIVE
Glucose, UA: NEGATIVE mg/dL
Ketones, POC UA: NEGATIVE mg/dL
Nitrite, UA: NEGATIVE
Protein Ur, POC: NEGATIVE mg/dL
Spec Grav, UA: 1.01 (ref 1.010–1.025)
Urobilinogen, UA: 0.2 E.U./dL
pH, UA: 6 (ref 5.0–8.0)

## 2022-05-09 MED ORDER — CEPHALEXIN 500 MG PO CAPS: 500.0000 mg | ORAL_CAPSULE | Freq: Two times a day (BID) | ORAL | 0 refills | Status: AC

## 2022-05-09 NOTE — ED Provider Notes (Signed)
[facility] Robin Bass    CSN: [id number] Arrival date & time: 05/09/22  1440      History   Chief Complaint Chief Complaint  Patient presents with   Urinary Frequency    HPI Robin Bass is a 71 y.o. female.   Patient presents with urinary frequency and lower abdominal pressure for 2 weeks.  Symptoms worsening this morning.  Endorses that it feels like everything is on fire in the genital area.  Has attempted use of Cystex which was ineffective.  Denies hematuria, dysuria, flank pain, fever, chills, new rash or lesions, vaginal itching, discharge or odor.  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      Home Medications    Prior to Admission medications   Medication Sig Start Date End Date Taking? Authorizing Provider  cephALEXin (KEFLEX) 500 MG capsule Take 1 capsule (500 mg total) by mouth 2 (two) times daily for 5 days. 05/09/22 05/14/22 Yes Shalina Norfolk R, NP  albuterol (VENTOLIN HFA) 108 (90 Base) MCG/ACT inhaler Inhale 2 puffs into the lungs every 4 (four) hours as needed for wheezing. 10/05/21   Tower, Wynelle Fanny, MD  amLODipine (NORVASC) 10 MG tablet TAKE  1 TABLET BY MOUTH ONCE A DAY 04/18/22   Copland, Frederico Hamman, MD  atorvastatin (LIPITOR) 20 MG tablet TAKE 1 TABLET BY MOUTH ONCE DAILY 02/07/22   Copland, Frederico Hamman, MD  Cholecalciferol (VITAMIN D) 50 MCG (2000 UT) CAPS Take 2,000 Units by mouth daily at 12 noon.    [provider]  clonazePAM (KLONOPIN) 0.5 MG tablet Take 1 tablet (0.5 mg total) by mouth 2 (two) times daily as needed for anxiety. 05/05/21   Copland, Frederico Hamman, MD  FLUoxetine (PROZAC) 20 MG capsule TAKE 1 CAPSULE BY MOUTH ONCE DAILY 11/15/21   Copland, Frederico Hamman, MD  levothyroxine (SYNTHROID) 50 MCG tablet TAKE 1 TABLET BY MOUTH ONCE A DAY. TAKE ON AN EMPTY STOMACH WITH A GLASS OF WATER ATLEAST 30-60 MIN BEFORE BREAKFAST 05/24/21   Copland, Frederico Hamman, MD  losartan-hydrochlorothiazide (HYZAAR) 100-25 MG tablet TAKE 1 TABLET BY MOUTH ONCE DAILY 01/11/22   Copland, Frederico Hamman, MD  metFORMIN (GLUCOPHAGE-XR) 500 MG 24 hr tablet Take 2 tablets (1,000 mg total) by mouth daily with breakfast. 03/14/22   Copland, Frederico Hamman, MD  ONE TOUCH ULTRA TEST test strip USE TO CHECK BLOOD SUGAR TWICE A DAY AND AS DIRECTED 06/20/15   Copland, Frederico Hamman, MD  [facility] DELICA LANCETS 24O MISC Use to check blood sugar two times a day 06/04/14   Owens Loffler, MD    Family History Family History  Problem Relation Age of Onset   Hypertension Mother  Heart attack Father    Breast cancer Neg Hx     Social History Social History   Tobacco Use   Smoking status: Never   Smokeless tobacco: Never  Vaping Use   Vaping Use: Never used  Substance Use Topics   Alcohol use: No   Drug use: No     Allergies   Sulfonamide derivatives and Ace inhibitors   Review of Systems Review of Systems  Constitutional: Negative.   Respiratory: Negative.    Cardiovascular: Negative.   Genitourinary:  Positive for frequency and pelvic pain. Negative for decreased urine volume, difficulty urinating, dyspareunia, dysuria, enuresis, flank pain, genital sores, hematuria, menstrual  problem and urgency.  Skin: Negative.      Physical Exam Triage Vital Signs ED Triage Vitals  Enc Vitals Group     BP 05/09/22 1511 114/67     Pulse Rate 05/09/22 1511 82     Resp 05/09/22 1511 16     Temp 05/09/22 1511 98.1 F (36.7 C)     Temp Source 05/09/22 1511 Oral     SpO2 05/09/22 1511 95 %     Weight --      Height --      Head Circumference --      Peak Flow --      Pain Score 05/09/22 1510 0     Pain Loc --      Pain Edu? --      Excl. in [place]? --    No data found.  Updated Vital Signs BP 114/67 (BP Location: Left Arm)   Pulse 82   Temp 98.1 F (36.7 C) (Oral)   Resp 16   SpO2 95%   Visual Acuity Right Eye Distance:   Left Eye Distance:   Bilateral Distance:    Right Eye Near:   Left Eye Near:    Bilateral Near:     Physical Exam Constitutional:      Appearance: Normal appearance.  HENT:     Head: Normocephalic.  Eyes:     Extraocular Movements: Extraocular movements intact.  Pulmonary:     Effort: Pulmonary effort is normal.  Abdominal:     General: Abdomen is flat. Bowel sounds are normal. There is no distension.     Palpations: Abdomen is soft.     Tenderness: There is abdominal tenderness in the suprapubic area. There is no right CVA tenderness or left CVA tenderness.  Genitourinary:    Comments: deferred Neurological:     Mental Status: She is alert and oriented to person, place, and time. Mental status is at baseline.  Psychiatric:        Mood and Affect: Mood normal.        Behavior: Behavior normal.      UC Treatments / Results  Labs (all labs ordered are listed, but only abnormal results are displayed) Labs Reviewed  POCT URINALYSIS DIP (MANUAL ENTRY) - Abnormal; Notable for the following components:      Result Value   Blood, UA small (*)    Leukocytes, UA Small (1+) (*)    All other components within normal limits  CERVICOVAGINAL ANCILLARY ONLY    EKG   Radiology No results found.  Procedures Procedures  (including critical care time)  Medications Ordered in UC Medications - No data to display  Initial Impression / Assessment and Plan / UC Course  I have reviewed the triage vital signs and the nursing notes.  Pertinent labs & imaging results that were available  during my care of the patient were reviewed by me and considered in my medical decision making (see chart for details).  Urinary frequency  Urinalysis showing Wes Lezotte blood cells, negative for infiltrates, sent for culture, vaginal swab for rule out BV and yeast pending, will treat prophylactically, Keflex 5-day course prescribed, recommended continued use of Cystex for additional support, may also, recommended increase fluid intake and additional measures, may follow-up with urgent care as needed if symptoms persist or worsen Final Clinical Impressions(s) / UC Diagnoses   Final diagnoses:  Urinary frequency     Discharge Instructions      Your urine analysis showed a very small amount of Azaryah Oleksy blood cells but did not show bacteria therefore it has been sent to the lab to determine if bacterial growth  Your vaginal swab checking for yeast and bacterial vaginosis is the test results only and medication sent to pharmacy at that time  Begin use of Keflex every morning and every evening for the next 5 days  You may continue use of Cystex as needed  Increase your fluid intake through use of water  As always practice good hygiene, wiping front to back and avoidance of scented vaginal products to prevent further irritation  If symptoms continue to persist after use of medication or recur please follow-up with urgent care or your primary doctor as needed    ED Prescriptions     Medication Sig Dispense Auth. Provider   cephALEXin (KEFLEX) 500 MG capsule Take 1 capsule (500 mg total) by mouth 2 (two) times daily for 5 days. 10 capsule Hans Eden, NP      PDMP not reviewed this encounter.   Hans Eden,  [place] 05/09/22 [phone]

## 2022-05-09 NOTE — Discharge Instructions (Addendum)
Your urine analysis showed a very small amount of Tymar Polyak blood cells but did not show bacteria therefore it has been sent to the lab to determine if bacterial growth  Your vaginal swab checking for yeast and bacterial vaginosis is the test results only and medication sent to pharmacy at that time  Begin use of Keflex every morning and every evening for the next 5 days  You may continue use of Cystex as needed  Increase your fluid intake through use of water  As always practice good hygiene, wiping front to back and avoidance of scented vaginal products to prevent further irritation  If symptoms continue to persist after use of medication or recur please follow-up with urgent care or your primary doctor as needed

## 2022-05-09 NOTE — ED Triage Notes (Signed)
Pt c/o urinary frequency for several weeks and lower abdomen discomfort that has gotten worse today.

## 2022-05-10 ENCOUNTER — Telehealth (HOSPITAL_COMMUNITY): Payer: Self-pay | Admitting: Emergency Medicine

## 2022-05-10 LAB — CERVICOVAGINAL ANCILLARY ONLY
Bacterial Vaginitis (gardnerella): NEGATIVE
Candida Glabrata: POSITIVE — AB
Candida Vaginitis: NEGATIVE
Comment: NEGATIVE
Comment: NEGATIVE
Comment: NEGATIVE

## 2022-05-10 MED ORDER — FLUCONAZOLE 150 MG PO TABS: 150.0000 mg | ORAL_TABLET | Freq: Once | ORAL | 0 refills | Status: AC

## 2022-05-17 ENCOUNTER — Other Ambulatory Visit: Payer: Self-pay | Admitting: Family Medicine

## 2022-05-17 MED ORDER — LEVOTHYROXINE SODIUM 50 MCG PO TABS: ORAL_TABLET | ORAL | 3 refills | Status: DC

## 2022-05-17 NOTE — Addendum Note (Signed)
Addended by: Carter Kitten on: 05/17/2022 12:43 PM   Modules accepted: Orders

## 2022-06-03 ENCOUNTER — Telehealth: Payer: Self-pay

## 2022-06-03 NOTE — Progress Notes (Unsigned)
    Chronic Care Management Pharmacy Assistant   Name: Mada Sadik  MRN: [id number] DOB: [birth year]  Reason for Encounter: CCM (Diabetes Disease State)  Recent office visits:  03/24/22 Owens Loffler, MD Primary osteoarthritis of left knee Administered: triamcinolone acetonide (KENALOG-40) injection 40 mg 03/14/22 Owens Loffler, MD Annual Exam Change: Metformin 1,000 mg Stop: (completed) Fluorouracil 5%   Recent consult visits:  05/09/22 [facility] Final Diagnoses: Urinary Frequency Start: Cephalexin 500 mg Start: Fluconazole 150 mg 01/20/22 [facility] visits:  None in previous 6 months  Medications: Outpatient Encounter Medications as of 06/03/2022  Medication Sig   albuterol (VENTOLIN HFA) 108 (90 Base) MCG/ACT inhaler Inhale 2 puffs into the lungs every 4 (four) hours as needed for wheezing.   amLODipine (NORVASC) 10 MG tablet TAKE 1 TABLET BY MOUTH ONCE A DAY   atorvastatin (LIPITOR) 20 MG tablet TAKE 1 TABLET BY MOUTH ONCE DAILY   Cholecalciferol (VITAMIN D) 50 MCG (2000 UT) CAPS Take 2,000 Units by mouth daily at 12 noon.   clonazePAM (KLONOPIN) 0.5 MG tablet Take 1 tablet (0.5 mg total) by mouth 2 (two) times daily as needed for anxiety.   FLUoxetine (PROZAC) 20 MG capsule TAKE 1 CAPSULE BY MOUTH ONCE DAILY   levothyroxine (SYNTHROID) 50 MCG tablet TAKE 1 TABLET BY MOUTH ONCE A DAY. TAKE ON AN EMPTY STOMACH WITH A GLASS OF WATER ATLEAST 30-60 MIN BEFORE BREAKFAST   losartan-hydrochlorothiazide (HYZAAR) 100-25 MG tablet TAKE 1 TABLET BY MOUTH ONCE DAILY   metFORMIN (GLUCOPHAGE-XR) 500 MG 24 hr tablet Take 2 tablets (1,000 mg total) by mouth daily with breakfast.   ONE TOUCH ULTRA TEST test strip USE TO CHECK BLOOD SUGAR TWICE A DAY AND AS DIRECTED   ONETOUCH DELICA LANCETS 02I MISC Use to check blood sugar two times a day   No facility-administered encounter medications on file as of 06/03/2022.   Recent Relevant Labs: Lab Results   Component Value Date/Time   HGBA1C 7.4 (H) 03/09/2022 09:42 AM   HGBA1C 7.5 (H) 10/05/2021 11:48 AM   MICROALBUR 1.4 03/14/2022 10:51 AM   MICROALBUR <0.7 03/04/2021 09:07 AM    Kidney Function Lab Results  Component Value Date/Time   CREATININE 0.84 03/09/2022 09:42 AM   CREATININE 0.92 10/05/2021 11:48 AM   GFR 71.02 03/09/2022 09:42 AM   GFRNONAA 92 07/31/2008 11:38 AM   GFRAA 112 07/31/2008 11:38 AM   Attempted contact with patient 3 times. Unsuccessful outreach. Will atttempt contact next month.  Current antihyperglycemic regimen:  Metformin 500 mg - Take 2 tablets daily with breakfast    Adherence Review: Is the patient currently on a STATIN medication? Yes Is the patient currently on ACE/ARB medication? Yes Does the patient have >5 day gap between last estimated fill dates? No  Care Gaps: Annual wellness visit in last year? No Most recent A1C reading: 7.4 on 03/09/22 Most Recent BP reading: 114/67 on 05/09/22  Last eye exam / retinopathy screening: 03/11/21 Last diabetic foot exam: 03/12/22  Star Rating Drugs:  Medication:   Last Fill: Day Supply Atorvastatin 20 mg  05/13/22 100 Losartan-HCTZ 100-25 mg 04/12/22 90 Metformin 500 mg  03/14/22 90 Verified fill dates with [place] appointment on 12/14/22  Charlene Brooke, CPP notified  Marijean Niemann, [place] Assistant [phone]

## 2022-07-07 ENCOUNTER — Telehealth: Payer: Self-pay

## 2022-07-07 NOTE — Progress Notes (Signed)
Chronic Care Management Pharmacy Assistant   Name: Robin Bass  MRN: [id number] DOB: 27-Dec-1950  Reason for Encounter: CCM (Diabetes Disease State)   Recent office visits:  None since last CCM contact  Recent consult visits:  None since last CCM contact  Hospital visits:  None since last CCM contact  Medications: Outpatient Encounter Medications as of 07/07/2022  Medication Sig   albuterol (VENTOLIN HFA) 108 (90 Base) MCG/ACT inhaler Inhale 2 puffs into the lungs every 4 (four) hours as needed for wheezing.   amLODipine (NORVASC) 10 MG tablet TAKE 1 TABLET BY MOUTH ONCE A DAY   atorvastatin (LIPITOR) 20 MG tablet TAKE 1 TABLET BY MOUTH ONCE DAILY   Cholecalciferol (VITAMIN D) 50 MCG (2000 UT) CAPS Take 2,000 Units by mouth daily at 12 noon.   clonazePAM (KLONOPIN) 0.5 MG tablet Take 1 tablet (0.5 mg total) by mouth 2 (two) times daily as needed for anxiety.   FLUoxetine (PROZAC) 20 MG capsule TAKE 1 CAPSULE BY MOUTH ONCE DAILY   levothyroxine (SYNTHROID) 50 MCG tablet TAKE 1 TABLET BY MOUTH ONCE A DAY. TAKE ON AN EMPTY STOMACH WITH A GLASS OF WATER ATLEAST 30-60 MIN BEFORE BREAKFAST   losartan-hydrochlorothiazide (HYZAAR) 100-25 MG tablet TAKE 1 TABLET BY MOUTH ONCE DAILY   metFORMIN (GLUCOPHAGE-XR) 500 MG 24 hr tablet Take 2 tablets (1,000 mg total) by mouth daily with breakfast.   ONE TOUCH ULTRA TEST test strip USE TO CHECK BLOOD SUGAR TWICE A DAY AND AS DIRECTED   ONETOUCH DELICA LANCETS 42V MISC Use to check blood sugar two times a day   No facility-administered encounter medications on file as of 07/07/2022.   Recent Relevant Labs: Lab Results  Component Value Date/Time   HGBA1C 7.4 (H) 03/09/2022 09:42 AM   HGBA1C 7.5 (H) 10/05/2021 11:48 AM   MICROALBUR 1.4 03/14/2022 10:51 AM   MICROALBUR <0.7 03/04/2021 09:07 AM    Kidney Function Lab Results  Component Value Date/Time   CREATININE 0.84 03/09/2022 09:42 AM   CREATININE 0.92 10/05/2021 11:48 AM    GFR 71.02 03/09/2022 09:42 AM   GFRNONAA 92 07/31/2008 11:38 AM   GFRAA 112 07/31/2008 11:38 AM    Contacted patient on 07/07/2022 to discuss diabetes disease state.   Current antihyperglycemic regimen:  Metformin 500 mg - Take 2 tablets daily with breakfast -    Patient verbally confirms she is taking the above medications as directed. No - Patient is only taking 1 tablet in the morning  What diet changes have been made to improve diabetes control? Patient tries to watch what she eats; she will eat chocolate every once in a while.   What recent interventions/DTPs have been made to improve glycemic control:  No recent interventions  Have there been any recent hospitalizations or ED visits since last visit with CPP? No  Patient denies hypoglycemic symptoms, including Pale, Sweaty, Shaky, Hungry, Nervous/irritable, and Vision changes  Patient denies hyperglycemic symptoms, including blurry vision, excessive thirst, fatigue, polyuria, and weakness  How often are you checking your blood sugar? Twice daily - every other day  Date  Morning Fasting Two hrs after dinner  09/13  110   141 09/11  119   152 09/09  122   180 09/07  116   170 09/05  111   157 09/03  118   166 09/01  116   169 08/30  ------   ----- 08/28  128   ----- 08/26  123   -----  08/24  122   126  During the week, how often does your blood glucose drop below 70? Never  Are you checking your feet daily/regularly? Yes  Adherence Review: Is the patient currently on a STATIN medication? Yes Is the patient currently on ACE/ARB medication? Yes Does the patient have >5 day gap between last estimated fill dates? No   Care Gaps: Annual wellness visit in last year? No Most recent A1C reading: 7.4 on 03/09/22 Most Recent BP reading: 114/67 on 05/09/22   Last eye exam / retinopathy screening: 03/11/21 Last diabetic foot exam: 03/12/22   Star Rating Drugs:  Medication:                            Last Fill:          Day Supply Atorvastatin 20 mg                  05/13/22          100 Losartan-HCTZ 100-25 mg     04/12/22          90 Metformin 500 mg                   03/14/22          90 - Pt only takes 1 instead of 2 so has plenty   CCM appointment on 12/14/22   Charlene Brooke, CPP notified   Robin Bass, [place] Assistant [phone]

## 2022-07-18 ENCOUNTER — Other Ambulatory Visit: Payer: Self-pay | Admitting: Family Medicine

## 2022-07-25 NOTE — Telephone Encounter (Addendum)
Patient is taking only 1 metformin ER 500 mg tablet daily. Pt was advised to increase metformin to 2 tablets in PCP visit 03/14/22: "A1c is somewhat high today, will increase metformin to 2 tablets"  Lab Results  Component Value Date/Time   HGBA1C 7.4 (H) 03/09/2022 09:42 AM   HGBA1C 7.5 (H) 10/05/2021 11:48 AM   Patient's A1c goal is < 7%.

## 2022-07-25 NOTE — Telephone Encounter (Signed)
Patient never increased to two tablets; I advised patient to take 2 tablets as directs at appointment.   Charlene Brooke, CPP notified  Marijean Niemann, [place] Clinical Pharmacy Assistant [phone]

## 2022-08-16 ENCOUNTER — Ambulatory Visit
Admission: EM | Admit: 2022-08-16 | Discharge: 2022-08-16 | Disposition: A | Discharge status: Home / Self Care | Payer: Medicare Other | Attending: Urgent Care | Admitting: Urgent Care

## 2022-08-16 DIAGNOSIS — N3001 Acute cystitis with hematuria: Secondary | ICD-10-CM | POA: Diagnosis not present

## 2022-08-16 LAB — POCT URINALYSIS DIP (MANUAL ENTRY)
Bilirubin, UA: NEGATIVE
Glucose, UA: NEGATIVE mg/dL
Nitrite, UA: NEGATIVE
Protein Ur, POC: 30 mg/dL — AB
Spec Grav, UA: >=1.03 — AB (ref 1.010–1.025)
Urobilinogen, UA: 0.2 E.U./dL
pH, UA: 5 (ref 5.0–8.0)

## 2022-08-16 MED ORDER — NITROFURANTOIN MONOHYD MACRO 100 MG PO CAPS: 100.0000 mg | ORAL_CAPSULE | Freq: Two times a day (BID) | ORAL | 0 refills | Status: DC

## 2022-08-16 NOTE — ED Triage Notes (Signed)
Pt. Presents to UC w/ c/o urinary frequency and pressure for the past 2 days. Pt. Expresses concern for UTI

## 2022-08-16 NOTE — ED Provider Notes (Signed)
[facility]    CSN: [id number] Arrival date & time: 08/16/22  1604      History   Chief Complaint Chief Complaint  Patient presents with   Urinary Frequency    HPI Robin Bass is a 71 y.o. female.    Urinary Frequency    Dents to UC with complaint of urinary frequency and pressure x2 days.  Denies fever, flank pain, abdominal pain, chills, myalgias.  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      Home Medications    Prior to Admission medications   Medication Sig Start Date End Date Taking? Authorizing Provider  albuterol (VENTOLIN HFA) 108 (90 Base) MCG/ACT inhaler Inhale 2 puffs into the lungs every 4 (four) hours as needed for wheezing. 10/05/21   Tower, Wynelle Fanny, MD  amLODipine (NORVASC) 10 MG tablet TAKE 1 TABLET BY MOUTH ONCE A DAY 04/18/22   Copland, Frederico Hamman, MD  atorvastatin (LIPITOR) 20 MG tablet TAKE 1 TABLET BY MOUTH ONCE DAILY 05/17/22   Copland, Frederico Hamman, MD  Cholecalciferol (VITAMIN D) 50 MCG (2000 UT) CAPS Take 2,000 Units by mouth daily at 12 noon.    [provider]  clindamycin (CLEOCIN T) 1 % lotion Apply topically.  04/04/22   [provider]  clonazePAM (KLONOPIN) 0.5 MG tablet Take 1 tablet (0.5 mg total) by mouth 2 (two) times daily as needed for anxiety. 05/05/21   Copland, Frederico Hamman, MD  fluconazole (DIFLUCAN) 150 MG tablet Take 150 mg by mouth every 3 (three) days. 05/10/22   [provider]  FLUoxetine (PROZAC) 20 MG capsule TAKE 1 CAPSULE BY MOUTH ONCE DAILY 05/17/22   Copland, Frederico Hamman, MD  levothyroxine (SYNTHROID) 50 MCG tablet TAKE 1 TABLET BY MOUTH ONCE A DAY. TAKE ON AN EMPTY STOMACH WITH A GLASS OF WATER ATLEAST 30-60 MIN BEFORE BREAKFAST 05/17/22   Copland, Frederico Hamman, MD  losartan-hydrochlorothiazide (HYZAAR) 100-25 MG tablet TAKE 1 TABLET BY MOUTH ONCE DAILY 05/17/22   Copland, Frederico Hamman, MD  metFORMIN (GLUCOPHAGE-XR) 500 MG 24 hr tablet Take 2 tablets (1,000 mg total) by mouth daily with breakfast. 03/14/22   Copland, Frederico Hamman, MD  ONE TOUCH ULTRA TEST test strip USE TO CHECK BLOOD SUGAR TWICE A DAY AND AS DIRECTED 06/20/15   Copland, Frederico Hamman, MD  [facility] DELICA LANCETS 93Y MISC Use to check blood sugar two times a day 06/04/14   Owens Loffler, MD    Family History Family History  Problem Relation Age of Onset   Hypertension Mother    Heart attack Father    Breast cancer Neg Hx     Social History Social History   Tobacco Use  Smoking status: Never   Smokeless tobacco: Never  Vaping Use   Vaping Use: Never used  Substance Use Topics   Alcohol use: No   Drug use: No     Allergies   Sulfonamide derivatives and Ace inhibitors   Review of Systems Review of Systems  Genitourinary:  Positive for frequency.     Physical Exam Triage Vital Signs ED Triage Vitals [08/16/22 1640]  Enc Vitals Group     BP 123/69     Pulse Rate 87     Resp 17     Temp 97.8 F (36.6 C)     Temp src      SpO2 95 %     Weight      Height      Head Circumference      Peak Flow      Pain Score 0     Pain Loc      Pain Edu?      Excl. in [place]?    No data found.  Updated Vital  Signs BP 123/69   Pulse 87   Temp 97.8 F (36.6 C)   Resp 17   SpO2 95%   Visual Acuity Right Eye Distance:   Left Eye Distance:   Bilateral Distance:    Right Eye Near:   Left Eye Near:    Bilateral Near:     Physical Exam Vitals reviewed.  Constitutional:      Appearance: Normal appearance.  Skin:    General: Skin is warm and dry.  Neurological:     General: No focal deficit present.     Mental Status: She is alert and oriented to person, place, and time.  Psychiatric:        Mood and Affect: Mood normal.        Behavior: Behavior normal.      UC Treatments / Results  Labs (all labs ordered are listed, but only abnormal results are displayed) Labs Reviewed  POCT URINALYSIS DIP (MANUAL ENTRY) - Abnormal; Notable for the following components:      Result Value   Clarity, UA cloudy (*)    Ketones, POC UA trace (5) (*)    Spec Grav, UA >=1.030 (*)    Blood, UA small (*)    Protein Ur, POC =30 (*)    Leukocytes, UA Trace (*)    All other components within normal limits    EKG   Radiology No results found.  Procedures Procedures (including critical care time)  Medications Ordered in UC Medications - No data to display  Initial Impression / Assessment and Plan / UC Course  I have reviewed the triage vital signs and the nursing notes.  Pertinent labs & imaging results that were available during my care of the patient were reviewed by me and considered in my medical decision making (see chart for details).   UA with trace leukocytes.  Will treat for presumption of acute cystitis with Macrobid, GFR equal 71.  Will send urine culture to verify susceptibility.   Final Clinical Impressions(s) / UC Diagnoses   Final diagnoses:  None   Discharge Instructions   None    ED Prescriptions   None    PDMP not reviewed this encounter.   Rose Phi, [place] 08/16/22 1652

## 2022-08-16 NOTE — Discharge Instructions (Addendum)
A sample of your urine has been sent to the lab for culture to verify the antibiotic ordered is appropriate for treatment.    Follow up here or with your primary care provider if your symptoms are worsening or not improving with treatment.

## 2022-08-19 ENCOUNTER — Telehealth: Payer: Self-pay | Admitting: Urgent Care

## 2022-08-19 DIAGNOSIS — N3001 Acute cystitis with hematuria: Secondary | ICD-10-CM

## 2022-08-19 LAB — URINE CULTURE: Culture: 30000 — AB

## 2022-08-19 MED ORDER — AMOXICILLIN 500 MG PO CAPS: 500.0000 mg | ORAL_CAPSULE | Freq: Three times a day (TID) | ORAL | 0 refills | Status: AC

## 2022-08-19 NOTE — Telephone Encounter (Signed)
Contacted patient regarding results of urine culture showing population with Enterococcus faecalis.  Documentation shows susceptibility to penicillins.  Spoke to patient on telephone and informed I would send prescription for amoxicillin 3 times daily x5 days.  Sent to her preferred pharmacy.

## 2022-09-06 ENCOUNTER — Ambulatory Visit
Admission: EM | Admit: 2022-09-06 | Discharge: 2022-09-06 | Disposition: A | Discharge status: Home / Self Care | Payer: Medicare Other | Attending: Emergency Medicine | Admitting: Emergency Medicine

## 2022-09-06 DIAGNOSIS — N390 Urinary tract infection, site not specified: Secondary | ICD-10-CM | POA: Diagnosis not present

## 2022-09-06 LAB — POCT URINALYSIS DIP (MANUAL ENTRY)
Glucose, UA: 100 mg/dL — AB
Nitrite, UA: POSITIVE — AB
Protein Ur, POC: >=300 mg/dL — AB
Spec Grav, UA: 1.025 (ref 1.010–1.025)
Urobilinogen, UA: 4 E.U./dL — AB
pH, UA: 5 (ref 5.0–8.0)

## 2022-09-06 MED ORDER — CEPHALEXIN 500 MG PO CAPS: 500.0000 mg | ORAL_CAPSULE | Freq: Two times a day (BID) | ORAL | 0 refills | Status: AC

## 2022-09-06 NOTE — Discharge Instructions (Addendum)
Take the antibiotic as directed.  The urine culture is pending.  We will call you if it shows the need to change or discontinue your antibiotic.    Follow up with your primary care provider if your symptoms are not improving.    

## 2022-09-06 NOTE — ED Provider Notes (Signed)
[facility]    CSN: [id number] Arrival date & time: 09/06/22  1500      History   Chief Complaint Chief Complaint  Patient presents with   Urinary Frequency    HPI Robin Bass is a 71 y.o. female.  Patient presents with dysuria and urinary frequency since this morning.  Treatment at home with Azo.  She denies fever, chills, abdominal pain, flank pain, vaginal discharge, pelvic pain, or other symptoms.  Patient was seen at this urgent care on 08/16/2022; diagnosed with acute cystitis; treated with Macrobid; antibiotic changed to amoxicillin based on urine culture.  Her medical history includes overactive bladder, diabetes, COPD.   The history is provided by the patient and medical records.    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      Home Medications    Prior to Admission medications   Medication Sig Start Date End Date Taking? Authorizing Provider  cephALEXin (KEFLEX) 500 MG capsule Take 1 capsule (500 mg total) by mouth 2 (two) times daily for 5 days. 09/06/22 09/11/22 Yes Sharion Balloon, NP  albuterol (VENTOLIN HFA) 108 (90 Base) MCG/ACT  inhaler Inhale 2 puffs into the lungs every 4 (four) hours as needed for wheezing. 10/05/21   Tower, Wynelle Fanny, MD  amLODipine (NORVASC) 10 MG tablet TAKE 1 TABLET BY MOUTH ONCE A DAY 04/18/22   Copland, Frederico Hamman, MD  atorvastatin (LIPITOR) 20 MG tablet TAKE 1 TABLET BY MOUTH ONCE DAILY 05/17/22   Copland, Frederico Hamman, MD  Cholecalciferol (VITAMIN D) 50 MCG (2000 UT) CAPS Take 2,000 Units by mouth daily at 12 noon.    [provider]  clindamycin (CLEOCIN T) 1 % lotion Apply topically. 04/04/22   [provider]  clonazePAM (KLONOPIN) 0.5 MG tablet Take 1 tablet (0.5 mg total) by mouth 2 (two) times daily as needed for anxiety. 05/05/21   Copland, Frederico Hamman, MD  fluconazole (DIFLUCAN) 150 MG tablet Take 150 mg by mouth every 3 (three) days. 05/10/22   [provider]  FLUoxetine (PROZAC) 20 MG capsule TAKE 1 CAPSULE BY MOUTH ONCE DAILY 05/17/22   Copland, Frederico Hamman, MD  levothyroxine (SYNTHROID) 50 MCG tablet TAKE 1 TABLET BY MOUTH ONCE A DAY. TAKE ON AN EMPTY STOMACH WITH A GLASS OF WATER ATLEAST 30-60 MIN BEFORE BREAKFAST 05/17/22   Copland, Frederico Hamman, MD  losartan-hydrochlorothiazide (HYZAAR) 100-25 MG tablet TAKE 1 TABLET BY MOUTH ONCE DAILY 05/17/22   Copland, Frederico Hamman, MD  metFORMIN (GLUCOPHAGE-XR) 500 MG 24 hr tablet Take 2 tablets (1,000 mg total) by mouth daily with breakfast. 03/14/22   Copland, Frederico Hamman, MD  ONE TOUCH ULTRA TEST test strip  USE TO CHECK BLOOD SUGAR TWICE A DAY AND AS DIRECTED 06/20/15   Copland, Frederico Hamman, MD  [facility] DELICA LANCETS 52W MISC Use to check blood sugar two times a day 06/04/14   Copland, Frederico Hamman, MD    Family History Family History  Problem Relation Age of Onset   Hypertension Mother    Heart attack Father    Breast cancer Neg Hx     Social History Social History   Tobacco Use   Smoking status: Never   Smokeless tobacco: Never  Vaping Use   Vaping Use: Never used  Substance Use Topics   Alcohol use: No   Drug use: No     Allergies    Sulfonamide derivatives and Ace inhibitors   Review of Systems Review of Systems  Constitutional:  Negative for chills and fever.  Gastrointestinal:  Negative for abdominal pain, nausea and vomiting.  Genitourinary:  Positive for dysuria and frequency. Negative for flank pain, hematuria, pelvic pain and vaginal discharge.  Skin:  Negative for rash.  All other systems reviewed and are negative.    Physical Exam Triage Vital Signs ED Triage Vitals [09/06/22 1530]  Enc Vitals Group     BP      Pulse Rate 94     Resp 18     Temp 98.2 F (36.8 C)     Temp src      SpO2 95 %     Weight      Height      Head Circumference      Peak Flow      Pain Score      Pain Loc      Pain Edu?      Excl. in [place]?    No data found.  Updated Vital Signs BP 136/79   Pulse 94   Temp 98.2 F (36.8 C)   Resp 18   Ht 4' 11.5" (1.511 m)   SpO2 95%   BMI 34.21 kg/m   Visual Acuity Right Eye Distance:   Left Eye Distance:   Bilateral Distance:    Right Eye Near:   Left Eye Near:    Bilateral Near:     Physical Exam Vitals and nursing note reviewed.  Constitutional:      General: She is not in acute distress.    Appearance: Normal appearance. She is well-developed. She is not ill-appearing.  HENT:     Mouth/Throat:     Mouth: Mucous membranes are moist.  Cardiovascular:     Rate and Rhythm: Normal rate and regular rhythm.     Heart sounds: Normal heart sounds.  Pulmonary:     Effort: Pulmonary effort is normal. No respiratory distress.     Breath sounds: Normal breath sounds.  Abdominal:     General: Bowel sounds are normal.     Palpations: Abdomen is soft.     Tenderness: There is no abdominal tenderness. There is no right CVA tenderness, left CVA tenderness, guarding or rebound.  Musculoskeletal:     Cervical back: Neck supple.  Skin:    General: Skin is warm and dry.  Neurological:     Mental Status: She is alert.  Psychiatric:        Mood and Affect: Mood normal.         Behavior: Behavior normal.      UC Treatments / Results  Labs (all labs ordered are listed, but only abnormal results are displayed) Labs Reviewed  POCT URINALYSIS DIP (MANUAL ENTRY) -  Abnormal; Notable for the following components:      Result Value   Color, UA orange (*)    Clarity, UA cloudy (*)    Glucose, UA =100 (*)    Bilirubin, UA moderate (*)    Ketones, POC UA small (15) (*)    Blood, UA large (*)    Protein Ur, POC >=300 (*)    Urobilinogen, UA 4.0 (*)    Nitrite, UA Positive (*)    Leukocytes, UA Large (3+) (*)    All other components within normal limits  URINE CULTURE    EKG   Radiology No results found.  Procedures Procedures (including critical care time)  Medications Ordered in UC Medications - No data to display  Initial Impression / Assessment and Plan / UC Course  I have reviewed the triage vital signs and the nursing notes.  Pertinent labs & imaging results that were available during my care of the patient were reviewed by me and considered in my medical decision making (see chart for details).   UTI.  Treating with Keflex. Urine culture pending. Discussed with patient that we will call her if the urine culture shows the need to change or discontinue the antibiotic. Instructed her to follow-up with her PCP due to recurrent symptoms. Patient agrees to plan of care.      Final Clinical Impressions(s) / UC Diagnoses   Final diagnoses:  Urinary tract infection without hematuria, site unspecified     Discharge Instructions      Take the antibiotic as directed.  The urine culture is pending.  We will call you if it shows the need to change or discontinue your antibiotic.    Follow up with your primary care provider if your symptoms are not improving.        ED Prescriptions     Medication Sig Dispense Auth. Provider   cephALEXin (KEFLEX) 500 MG capsule Take 1 capsule (500 mg total) by mouth 2 (two) times daily for 5 days. 10  capsule Sharion Balloon, NP      PDMP not reviewed this encounter.   Sharion Balloon, NP 09/06/22 1555

## 2022-09-06 NOTE — ED Triage Notes (Signed)
Patient to Urgent Care with complaints of urinary frequency that started today. Reports dysuria. Denies any fevers.   Hx of cystitis- reports increased stress and states her cystitis is often triggered by stress.   Patient has been taking azo.

## 2022-09-09 LAB — URINE CULTURE: Culture: >=100000 — AB

## 2022-09-12 ENCOUNTER — Encounter: Payer: Self-pay | Admitting: Family Medicine

## 2022-09-12 ENCOUNTER — Ambulatory Visit (INDEPENDENT_AMBULATORY_CARE_PROVIDER_SITE_OTHER): Payer: Medicare Other | Admitting: Family Medicine

## 2022-09-12 VITALS — BP 130/66 | HR 95 | Temp 98.4°F | Ht 59.5 in | Wt 166.5 lb

## 2022-09-12 DIAGNOSIS — R300 Dysuria: Secondary | ICD-10-CM | POA: Diagnosis not present

## 2022-09-12 DIAGNOSIS — N390 Urinary tract infection, site not specified: Secondary | ICD-10-CM

## 2022-09-12 LAB — POC URINALSYSI DIPSTICK (AUTOMATED)
Bilirubin, UA: NEGATIVE
Blood, UA: NEGATIVE
Glucose, UA: NEGATIVE
Ketones, UA: NEGATIVE
Leukocytes, UA: NEGATIVE
Nitrite, UA: NEGATIVE
Protein, UA: POSITIVE — AB
Spec Grav, UA: 1.015 (ref 1.010–1.025)
Urobilinogen, UA: 0.2 E.U./dL
pH, UA: 7 (ref 5.0–8.0)

## 2022-09-12 MED ORDER — CIPROFLOXACIN HCL 500 MG PO TABS: 500.0000 mg | ORAL_TABLET | Freq: Two times a day (BID) | ORAL | 0 refills | Status: AC

## 2022-09-12 NOTE — Progress Notes (Signed)
Robin Enochs T. Robin Prowell, MD, [place] at [facility] [place], [address]  Phone: [phone]  FAX: [phone]  Robin Bass - 71 y.o. female  MRN [id number]  Date of Birth: January 27, 1951  Date: 09/12/2022  PCP: Owens Loffler, MD  Referral: Owens Loffler, MD  Chief Complaint  Patient presents with   Recurrent UTI    Seen at [facility] 09/06/22   Subjective:   [name] is a 71 y.o. very pleasant female patient with Body mass index is 33.07 kg/m. who presents with the following:  Robin Bass presents with a question of possible urinary tract infection.  Has been on Keflex and macrobid.    Last night, did not make it to the bathroom.  Cat is sick and sleeping in the TV room.  Went down th ehall, had some leaking.   She basically still does not feel well.  She was given Keflex 500 mg p.o. twice daily x5 days.  She is worried that she still has symptoms, and I did review her most recent culture.  She has had 2 UTIs seen in urgent care in the last month.  She is under tremendous amount of stress right now, her husband has been in the hospital, and her cat just died.  E. Coli UTI  Out and felt like everything was on fire.   Review of Systems is noted in the HPI, as appropriate  Objective:   BP 130/66   Pulse 95   Temp 98.4 F (36.9 C) (Oral)   Ht 4' 11.5" (1.511 m)   Wt 166 lb 8 oz (75.5 kg)   SpO2 98%   BMI 33.07 kg/m    GEN: WDWN HEENT: Atraumatc, normocephalic. ABD: S, NT, ND, +BS, no rebound. No CVAT. No suprapubic tenderness.   Laboratory and Imaging Data:  Assessment and Plan:     ICD-10-CM   1. Recurrent UTI  N39.0 POCT Urinalysis Dipstick (Automated)    2. Dysuria  R30.0      Recurrent UTI.  Urine looks clear today, she just finished antibiotics.  Clinically, it sounds as if she is having a resurgence of UTI, and a question of this has been incompletely treated.  I will  get a put her on Cipro 500 mg p.o. x7 days.  Medication Management during today's office visit: Meds ordered this encounter  Medications   ciprofloxacin (CIPRO) 500 MG tablet    Sig: Take 1 tablet (500 mg total) by mouth 2 (two) times daily for 7 days.    Dispense:  14 tablet    Refill:  0   Medications Discontinued During This Encounter  Medication Reason   fluconazole (DIFLUCAN) 150 MG tablet Completed Course    Orders placed today for conditions managed today: Orders Placed This Encounter  Procedures   POCT Urinalysis Dipstick (Automated)    Disposition: No follow-ups on file.  Dragon [employer] One speech-to-text software was used for transcription in this dictation.  Possible transcriptional errors can occur using Editor, commissioning.   Signed,  [name]. Chinonso Linker, MD   Outpatient Encounter Medications as of 09/12/2022  Medication Sig   albuterol (VENTOLIN HFA) 108 (90 Base) MCG/ACT inhaler Inhale 2 puffs into the lungs every 4 (four) hours as needed for wheezing.   amLODipine (NORVASC) 10 MG tablet TAKE 1 TABLET BY MOUTH ONCE A DAY   atorvastatin (LIPITOR) 20 MG tablet TAKE 1 TABLET BY MOUTH ONCE DAILY   Cholecalciferol (VITAMIN D) 50  MCG (2000 UT) CAPS Take 2,000 Units by mouth daily at 12 noon.   ciprofloxacin (CIPRO) 500 MG tablet Take 1 tablet (500 mg total) by mouth 2 (two) times daily for 7 days.   clindamycin (CLEOCIN T) 1 % lotion Apply topically.   clonazePAM (KLONOPIN) 0.5 MG tablet Take 1 tablet (0.5 mg total) by mouth 2 (two) times daily as needed for anxiety.   FLUoxetine (PROZAC) 20 MG capsule TAKE 1 CAPSULE BY MOUTH ONCE DAILY   levothyroxine (SYNTHROID) 50 MCG tablet TAKE 1 TABLET BY MOUTH ONCE A DAY. TAKE ON AN EMPTY STOMACH WITH A GLASS OF WATER ATLEAST 30-60 MIN BEFORE BREAKFAST   losartan-hydrochlorothiazide (HYZAAR) 100-25 MG tablet TAKE 1 TABLET BY MOUTH ONCE DAILY   metFORMIN (GLUCOPHAGE-XR) 500 MG 24 hr tablet Take 2 tablets (1,000 mg total) by mouth  daily with breakfast.   ONE TOUCH ULTRA TEST test strip USE TO CHECK BLOOD SUGAR TWICE A DAY AND AS DIRECTED   ONETOUCH DELICA LANCETS 78M MISC Use to check blood sugar two times a day   [DISCONTINUED] fluconazole (DIFLUCAN) 150 MG tablet Take 150 mg by mouth every 3 (three) days.   No facility-administered encounter medications on file as of 09/12/2022.

## 2022-09-13 ENCOUNTER — Telehealth: Payer: Self-pay

## 2022-09-13 NOTE — Progress Notes (Signed)
    Chronic Care Management Pharmacy Assistant   Name: Robin Bass  MRN: [id number] DOB: 11-23-1950  Reason for Encounter: CCM (Schedule DM Eye Exam)   Patient was offered diabetic eye exam. Patient was made aware this eye exam would not replace their primary eye doctor exam in which glasses and/or contacts would be offered. Patient was advised that all [facility] patients will receive diabetic retinopathy screening for free.  Patient declined; patient goes to [facility]. She will call and make an appointment with them.  Charlene Brooke, CPP notified  Marijean Niemann, [place] Clinical Pharmacy Assistant [phone]

## 2022-10-04 DIAGNOSIS — D0462 Carcinoma in situ of skin of left upper limb, including shoulder: Secondary | ICD-10-CM | POA: Diagnosis not present

## 2022-10-04 DIAGNOSIS — L821 Other seborrheic keratosis: Secondary | ICD-10-CM | POA: Diagnosis not present

## 2022-10-18 ENCOUNTER — Other Ambulatory Visit: Payer: Self-pay | Admitting: Family Medicine

## 2022-11-25 ENCOUNTER — Encounter: Payer: Self-pay | Admitting: Family Medicine

## 2022-11-27 MED ORDER — FLUOXETINE HCL 20 MG PO CAPS: 20.0000 mg | ORAL_CAPSULE | Freq: Every day | ORAL | 0 refills | Status: DC

## 2022-12-08 ENCOUNTER — Telehealth: Payer: Self-pay

## 2022-12-08 DIAGNOSIS — E119 Type 2 diabetes mellitus without complications: Secondary | ICD-10-CM | POA: Diagnosis not present

## 2022-12-08 DIAGNOSIS — Z9841 Cataract extraction status, right eye: Secondary | ICD-10-CM | POA: Diagnosis not present

## 2022-12-08 DIAGNOSIS — H52223 Regular astigmatism, bilateral: Secondary | ICD-10-CM | POA: Diagnosis not present

## 2022-12-08 DIAGNOSIS — Z9842 Cataract extraction status, left eye: Secondary | ICD-10-CM | POA: Diagnosis not present

## 2022-12-08 LAB — HM DIABETES EYE EXAM

## 2022-12-08 NOTE — Progress Notes (Signed)
Care Management & Coordination Services Pharmacy Team  Reason for Encounter: Appointment Reminder  Contacted patient to confirm telephone appointment with Charlene Brooke, PharmD on 12/14/2022 at 8:45.  Unsuccessful outreach. Left voicemail for patient to return call.  Star Rating Drugs:  Medication:   Last Fill: Day Supply Atorvastatin 20 mg  11/21/2022 100 Losartan-HCTZ 100-25 mg 10/05/2022 90 Metformin 500 mg  11/03/2022 90  Care Gaps: Annual wellness visit in last year? Yes 03/14/2022  If Diabetic: Last eye exam / retinopathy screening: Overdue Last diabetic foot exam: Overdue  Charlene Brooke, PharmD notified  Marijean Niemann, [place] Pharmacy Assistant [phone]

## 2022-12-09 ENCOUNTER — Encounter: Payer: Self-pay | Admitting: Family Medicine

## 2022-12-14 ENCOUNTER — Other Ambulatory Visit: Payer: Self-pay | Admitting: Family Medicine

## 2022-12-14 ENCOUNTER — Ambulatory Visit: Payer: Medicare Other | Admitting: Pharmacist

## 2022-12-14 ENCOUNTER — Telehealth: Payer: Self-pay | Admitting: Pharmacist

## 2022-12-14 DIAGNOSIS — L658 Other specified nonscarring hair loss: Secondary | ICD-10-CM

## 2022-12-14 NOTE — Progress Notes (Signed)
Care Management & Coordination Services Pharmacy Note  12/14/2022 Name:  Robin Bass MRN:  [id number] DOB:  December 28, 1950  Summary: -DM: A1c 7.4% (02/2022), metformin was increased to 1000 mg/day at that point but today pt reports he is only taking 500 mg/day still; she reports fasting BG < 130 and 2-hr post-prandial BG 140-180 -Pt reports hair thinning/slow growth (got perms for years, stopped 1 year ago); she is interested in pursuing labwork to look for vitamin deficiencies that may be contributing  Recommendations/Changes made from today's visit: -Overdue for PCP appt. Scheduled OV -Recommend labwork: Vitamin D, B12, iron, folate  Follow up plan: -Pharmacist follow up PRN -PCP appt 01/02/23    Subjective: Robin Bass is an 72 y.o. year old female who is a primary patient of Copland, Frederico Hamman, MD.  The care coordination team was consulted for assistance with disease management and care coordination needs.    Engaged with patient by telephone for follow up visit.  Recent office visits: 09/12/22 [name] OV: recurrent UTI - rx cipro  03/14/22 Dr Copland OV: annual - increase metformin to 1000 mg/day.   Recent consult visits: 09/06/22 Urgent Care - UTI 08/16/22 Urgent care - UTI 05/09/22 Urgent care - urinary frequency  Hospital visits: None in previous 6 months   Objective:  Lab Results  Component Value Date   CREATININE 0.84 03/09/2022   BUN 19 03/09/2022   GFR 71.02 03/09/2022   GFRNONAA 92 07/31/2008   GFRAA 112 07/31/2008   NA 138 03/09/2022   K 3.7 03/09/2022   CALCIUM 10.1 03/09/2022   CO2 27 03/09/2022   GLUCOSE 142 (H) 03/09/2022    Lab Results  Component Value Date/Time   HGBA1C 7.4 (H) 03/09/2022 09:42 AM   HGBA1C 7.5 (H) 10/05/2021 11:48 AM   GFR 71.02 03/09/2022 09:42 AM   GFR 63.86 10/05/2021 11:48 AM   MICROALBUR 1.4 03/14/2022 10:51 AM   MICROALBUR <0.7 03/04/2021 09:07 AM    Last diabetic Eye exam:  Lab Results   Component Value Date/Time   HMDIABEYEEXA No Retinopathy 12/08/2022 12:00 AM    Last diabetic Foot exam: No results found for: "HMDIABFOOTEX"   Lab Results  Component Value Date   CHOL 157 03/09/2022   HDL 56.80 03/09/2022   LDLCALC 79 03/09/2022   LDLDIRECT 109.6 05/13/2013   TRIG 107.0 03/09/2022   CHOLHDL 3 03/09/2022       Latest Ref Rng & Units 03/09/2022    9:42 AM 10/05/2021   11:48 AM 03/04/2021    9:07 AM  Hepatic Function  Total Protein 6.0 - 8.3 g/dL 7.3  7.3  7.0   Albumin 3.5 - 5.2 g/dL 4.4  3.6  4.2   AST 0 - 37 U/L 12  16  13   $ ALT 0 - 35 U/L 13  12  14   $ Alk Phosphatase 39 - 117 U/L 123  112  113   Total Bilirubin 0.2 - 1.2 mg/dL 0.5  0.3  0.6   Bilirubin, Direct 0.0 - 0.3 mg/dL 0.1   0.1     Lab Results  Component Value Date/Time   TSH 5.59 (H) 03/09/2022 09:42 AM   TSH 4.23 03/04/2021 09:07 AM   FREET4 1.11 03/09/2022 09:42 AM   FREET4 0.97 03/04/2021 09:07 AM       Latest Ref Rng & Units 03/09/2022    9:42 AM 03/04/2021    9:07 AM 12/11/2019    8:47 AM  CBC  WBC 4.0 - 10.5 K/uL  7.7  8.1  8.1   Hemoglobin 12.0 - 15.0 g/dL 12.5  12.4  12.6   Hematocrit 36.0 - 46.0 % 38.1  36.7  38.2   Platelets 150.0 - 400.0 K/uL 348.0  352.0  352.0     Lab Results  Component Value Date/Time   VD25OH 44.22 03/09/2022 09:42 AM   VD25OH 35.98 03/04/2021 09:07 AM    Clinical ASCVD: No  The 10-year ASCVD risk score (Arnett DK, et al., 2019) is: 19.7%   Values used to calculate the score:     Age: 72 years     Sex: Female     Is Non-Hispanic African American: No     Diabetic: Yes     Tobacco smoker: No     Systolic Blood Pressure: [id number] mmHg     Is BP treated: Yes     HDL Cholesterol: 56.8 mg/dL     Total Cholesterol: 157 mg/dL        03/03/2022    1:51 PM 03/02/2021    2:06 PM 06/08/2020    3:09 PM  Depression screen PHQ 2/9  Decreased Interest 0 0 0  Down, Depressed, Hopeless 0 0 0  PHQ - 2 Score 0 0 0  Altered sleeping  0   Tired, decreased energy   0   Change in appetite  0   Feeling bad or failure about yourself   0   Trouble concentrating  0   Moving slowly or fidgety/restless  0   Suicidal thoughts  0   PHQ-9 Score  0   Difficult doing work/chores  Not difficult at all      Social History   Tobacco Use  Smoking Status Never  Smokeless Tobacco Never   BP Readings from Last 3 Encounters:  09/12/22 130/66  09/06/22 136/79  08/16/22 123/69   Pulse Readings from Last 3 Encounters:  09/12/22 95  09/06/22 94  08/16/22 87   Wt Readings from Last 3 Encounters:  09/12/22 166 lb 8 oz (75.5 kg)  03/24/22 172 lb 4 oz (78.1 kg)  03/14/22 169 lb 5 oz (76.8 kg)   BMI Readings from Last 3 Encounters:  09/12/22 33.07 kg/m  09/06/22 34.21 kg/m  03/24/22 34.21 kg/m    Allergies  Allergen Reactions   Sulfonamide Derivatives    Ace Inhibitors Cough    Medications Reviewed Today     Reviewed by Carter Kitten, CMA (Certified Medical Assistant) on 09/12/22 at 72  Med List Status: <None>   Medication Order Taking? Sig Documenting Provider Last Dose Status Informant  albuterol (VENTOLIN HFA) 108 (90 Base) MCG/ACT inhaler [id number] Yes Inhale 2 puffs into the lungs every 4 (four) hours as needed for wheezing. Tower, Wynelle Fanny, MD Taking Active   amLODipine (NORVASC) 10 MG tablet [id number] Yes TAKE 1 TABLET BY MOUTH ONCE A DAY Copland, Spencer, MD Taking Active   atorvastatin (LIPITOR) 20 MG tablet [id number] Yes TAKE 1 TABLET BY MOUTH ONCE DAILY Copland, Spencer, MD Taking Active   Cholecalciferol (VITAMIN D) 50 MCG (2000 UT) CAPS [id number] Yes Take 2,000 Units by mouth daily at 12 noon. [provider] Taking Active Self  clindamycin (CLEOCIN T) 1 % lotion [id number] Yes Apply topically. [provider] Taking Active   clonazePAM (KLONOPIN) 0.5 MG tablet [id number] Yes Take 1 tablet (0.5 mg total) by mouth 2 (two) times daily as needed for anxiety. Owens Loffler, MD Taking Active   FLUoxetine (PROZAC) 20 MG  capsule [id number] Yes TAKE 1 CAPSULE  BY MOUTH ONCE DAILY Copland, Spencer, MD Taking Active   levothyroxine (SYNTHROID) 50 MCG tablet [id number] Yes TAKE 1 TABLET BY MOUTH ONCE A DAY. TAKE ON AN EMPTY STOMACH WITH A GLASS OF WATER ATLEAST 30-60 MIN BEFORE BREAKFAST Copland, Spencer, MD Taking Active   losartan-hydrochlorothiazide (HYZAAR) 100-25 MG tablet [id number] Yes TAKE 1 TABLET BY MOUTH ONCE DAILY Copland, Spencer, MD Taking Active   metFORMIN (GLUCOPHAGE-XR) 500 MG 24 hr tablet [id number] Yes Take 2 tablets (1,000 mg total) by mouth daily with breakfast. Copland, Frederico Hamman, MD Taking Active   ONE TOUCH ULTRA TEST test strip [id number] Yes USE TO CHECK BLOOD SUGAR TWICE A DAY AND AS DIRECTED Copland, Frederico Hamman, MD Taking Active   [facility] LANCETS [id number] [place] [id number] Yes Use to check blood sugar two times a day Copland, Frederico Hamman, MD Taking Active             SDOH:  (Social Determinants of Health) assessments and interventions performed: No SDOH Interventions    Flowsheet Row Clinical Support from 03/03/2022 in [place] at [place] Management from 05/05/2021 in [place] at [place] from 03/02/2021 in [place] at [place] from 12/11/2019 in [place] at [place] Interventions Intervention Not Indicated -- -- --  Housing Interventions Intervention Not Indicated -- -- --  Transportation Interventions Intervention Not Indicated -- -- --  Depression Interventions/Treatment  -- -- PHQ2-9 Score <4 Follow-up Not Indicated PHQ2-9 Score <4 Follow-up Not Indicated  Financial Strain Interventions Intervention Not Indicated Intervention Not Indicated -- --  Physical Activity Interventions Patient Refused -- -- --  Stress Interventions Intervention Not Indicated -- -- --  Social Connections Interventions Intervention Not  Indicated -- -- --       Medication Assistance: None required.  Patient affirms current coverage meets needs.  Medication Access: Within the past 30 days, how often has patient missed a dose of medication? 0 Is a pillbox or other method used to improve adherence? Yes  Factors that may affect medication adherence? no barriers identified Are meds synced by current pharmacy? No  Are meds delivered by current pharmacy? No  Does patient experience delays in picking up medications due to transportation concerns? No   [facility] Services Reviewed: Is patient disadvantaged to use [facility]?: No  Current Rx insurance plan: [facility] Name and location of Current pharmacy:  [place], [place] [address] Phone: [phone] Fax: [phone]  [facility] services reviewed with patient today?: No  Patient requests to transfer care to [facility]?: No  Reason patient declined to change pharmacies: Loyalty to other pharmacy/Patient preference  Compliance/Adherence/Medication fill history: Care Gaps: Colonoscopy DEXA Foot exam (due 02/2022) A1c (due 08/2022)  Star-Rating Drugs: Atorvastatin - PDC Losartan - PDC Metformin - PDC   ASSESSMENT / PLAN:  Diabetes (A1c goal <7%) -Uncontrolled - A1c 7.4% (02/2022) -Current home glucose  - checks BG twice a day, every other day  fasting glucose: 133, typically < 130 post prandial glucose: 155, 158, 147, 138, 139, 169, 183, 137, 127 -Denies hypoglycemic/hyperglycemic symptoms -Current medications: Metformin XR 500 mg - 1 tab daily -Medications previously tried: none -Current meal patterns: She reports she has attended diabetes classes and still refers back to the diabetic education packets. She is trying to limit breads now that she is feeling better. -Current  exercise: She has not resumed Yoga or any formal exercise since COVID. She does try to get steps in  daily.  -Reviewed cholesterol results and BP goals with patient. Both are controlled, stable. -Recommended to continue medication; Recommend schedule follow up with Dr. Lorelei Pont for A1c May 2023.  Hypertension (BP goal <130/80) -Controlled - per pt report; she did recently get BP monitor mainly for her husband's BP issues, she has been checking herself occasionally and notes no abnormal BP -Current home BP readings: 125/76 -Current treatment: Losartan-HCTZ 100-25 mg daily - Appropriate, Effective, Safe, Accessible Amlodipine 10 mg daily - Appropriate, Effective, Safe, Accessible -Medications previously tried: n/a  -Denies hypotensive/hypertensive symptoms -Educated on BP goals and benefits of medications for prevention of heart attack, stroke and kidney damage;Symptoms of hypotension and importance of maintaining adequate hydration; -Counseled to monitor BP at home periodically -Recommended to continue current medication  Hyperlipidemia: (LDL goal < 70) -Controlled - LDL 79 (02/2022) adequate -Current treatment: Atorvastatin 20 mg daily - Appropriate, Effective, Safe, Accessible -Medications previously tried: n/a  -Educated on Cholesterol goals;  -Recommended to continue current medication  Depression(Goal: Improve mood -Controlled - per patient report. She states she is doing very well. Sleeping much better with fluoxetine in morning. She was able to taper off Tylenol PM. She only uses clonazepam for acute stress situations.  -PHQ9: 0 (02/2022) - minimal depression -Current treatment: Fluoxetine 20 mg daily - Appropriate, Effective, Safe, Accessible Clonazepam 0.5 mg - 1 BID prn anxiety - Appropriate, Effective, Safe, Accessible -Medications previously tried/failed: Buspirone (drowsiness);  she has never been on any other antidepressants or higher dose fluoxetine. -Recommended to continue current medication  Health Maintenance -Vaccine gaps: Shingrix -Hypothyroidism: on levothyroxine  50 mcg, last TSH was 5.59 -Hx COPD/Asthma, only on albuterol PRN. No PFT on file. -c/o limited hair growth (perms for years, stopped 1 year ago); she is interested in pursuing bloodwork to test for vitamin deficiencies that may be contributing. Scheduled PCP visit to discuss.   Charlene Brooke, PharmD, BCACP Clinical Pharmacist [facility] at [facility] [phone]

## 2022-12-14 NOTE — Telephone Encounter (Signed)
Can you let Robin Bass know that I put all of these labs in for her.  Since they are non-emergent, she can probably wait a couple weeks until the blood draw for her upcoming appointment in 2 weeks or so.

## 2022-12-14 NOTE — Telephone Encounter (Signed)
Patient reports hair thinning/slow growth for about a year (she had perms for years and stopped 1 year ago). She wants to pursue labwork to test for vitamin deficiencies that may be contributing. Scheduled PCP appt (overdue for f/u from Nov for A1c), can discuss labwork as well.  Recommended labwork for hair thinning/loss: -Vitamin D, B12, iron, folate  Routing to PCP as FYI.

## 2022-12-14 NOTE — Patient Instructions (Signed)
Visit Information  Phone number for Pharmacist: [phone]  Thank you for meeting with me to discuss your medications! Below is a summary of what we talked about during the visit:    Recommendations/Changes made from today's visit: -Overdue for PCP appt. Scheduled OV -Recommend labwork: B12, Vitamin D, iron, folate  Follow up plan: -Pharmacist follow up PRN -PCP appt 01/02/23   Charlene Brooke, PharmD, BCACP Clinical Pharmacist [facility] at [facility] [phone]

## 2022-12-14 NOTE — Telephone Encounter (Signed)
Left message for Robin Bass that Dr. Lorelei Pont got the message from [place] and we will plan on checking those labs she recommended at her appointment on 01/02/2023.  I ask that she call me back if she has any questions.

## 2023-01-02 ENCOUNTER — Ambulatory Visit (INDEPENDENT_AMBULATORY_CARE_PROVIDER_SITE_OTHER)
Admission: RE | Admit: 2023-01-02 | Discharge: 2023-01-02 | Disposition: A | Discharge status: Home / Self Care | Payer: Medicare Other | Source: Ambulatory Visit | Attending: Family Medicine | Admitting: Family Medicine

## 2023-01-02 ENCOUNTER — Encounter: Payer: Self-pay | Admitting: Family Medicine

## 2023-01-02 ENCOUNTER — Ambulatory Visit (INDEPENDENT_AMBULATORY_CARE_PROVIDER_SITE_OTHER): Payer: Medicare Other | Admitting: Family Medicine

## 2023-01-02 VITALS — BP 110/60 | HR 88 | Temp 97.9°F | Ht 59.5 in | Wt 162.5 lb

## 2023-01-02 DIAGNOSIS — E119 Type 2 diabetes mellitus without complications: Secondary | ICD-10-CM | POA: Diagnosis not present

## 2023-01-02 DIAGNOSIS — M1712 Unilateral primary osteoarthritis, left knee: Secondary | ICD-10-CM

## 2023-01-02 DIAGNOSIS — L658 Other specified nonscarring hair loss: Secondary | ICD-10-CM | POA: Diagnosis not present

## 2023-01-02 LAB — POCT GLYCOSYLATED HEMOGLOBIN (HGB A1C): Hemoglobin A1C: 7.1 % — AB (ref 4.0–5.6)

## 2023-01-02 MED ORDER — TRIAMCINOLONE ACETONIDE 40 MG/ML IJ SUSP
40.0000 mg | Freq: Once | INTRAMUSCULAR | Status: AC
  Administered 2023-01-02: 40 mg via INTRA_ARTICULAR

## 2023-01-02 NOTE — Patient Instructions (Signed)
Biotin and Folate (Folic acid) supplements for hair loss.   Rogaine 5% (minoxidil spray, foam, multiple things like that available)

## 2023-01-02 NOTE — Progress Notes (Unsigned)
Robin Charlesworth T. Tomasita Beevers, MD, [place] at [facility] [place], [address]  Phone: [phone]  FAX: [phone]  Robin Bass - 72 y.o. female  MRN [id number]  Date of Birth: [birth year]  Date: 01/02/2023  PCP: Owens Loffler, MD  Referral: Owens Loffler, MD  Chief Complaint  Patient presents with   Diabetes   Knee Pain    Left-Discuss Gel Injections   Alopecia   Subjective:   Robin Bass is a 72 y.o. very pleasant female patient with Body mass index is 32.27 kg/m. who presents with the following:  She is a pleasant lady, she presents for type 2 diabetes follow-up.  Last saw her for her general health exam in May 2023.  I have seen her intermittently for some other issues since then.  She is also would like to talk about some thinning hair and what to do about this.  Diabetes Mellitus: Tolerating Medications: yes Compliance with diet: fair, Body mass index is 32.27 kg/m.  Doing well most of the time.  Exercise: minimal / intermittent Avg blood sugars at home: not checking Foot problems: none Hypoglycemia: none No nausea, vomitting, blurred vision, polyuria.  Lab Results  Component Value Date   HGBA1C 7.1 (A) 01/02/2023   HGBA1C 7.4 (H) 03/09/2022   HGBA1C 7.5 (H) 10/05/2021   Lab Results  Component Value Date   MICROALBUR 1.4 03/14/2022   LDLCALC 79 03/09/2022   CREATININE 0.84 03/09/2022    Wt Readings from Last 3 Encounters:  01/02/23 162 lb 8 oz (73.7 kg)  09/12/22 166 lb 8 oz (75.5 kg)  03/24/22 172 lb 4 oz (78.1 kg)    Alopecia:  D, B12, Iron, folate.  - Seems to not be like it was.   She does have thinning hair, and she after consults with patient with pharmacy is concerned about some potential vitamin deficiencies.  We will check these.  L knee osteoarthritis.  - still hurting and not doing well.  She has known osteoarthritis from some x-rays 5 years ago, but  her pain has been intermittent and more frequent recently.  She does have some loss of extension and some pain with terminal motion.  She has not had any falls.  She is not having any locking up of the joint or mechanical giving way.  Inject L knee today  FIND OUT ABOUT VISCO FOR THE LEFT KNEE    Review of Systems is noted in the HPI, as appropriate  Objective:   BP 110/60   Pulse 88   Temp 97.9 F (36.6 C) (Temporal)   Ht 4' 11.5" (1.511 m)   Wt 162 lb 8 oz (73.7 kg)   SpO2 96%   BMI 32.27 kg/m   GEN: No acute distress; alert,appropriate. PULM: Breathing comfortably in no respiratory distress PSYCH: Normally interactive.   Thinning hair  Left knee: She lacks 5 degrees of extension.  Flexion to 120. Mild movement at the patella only and some pain with loading the lateral and medial patellar facets. She does have medial and lateral joint line tenderness Stable to varus and valgus stress. ACL and PCL are intact Any kind of forced flexion or extension causes pain  Laboratory and Imaging Data:  Assessment and Plan:     ICD-10-CM   1. Controlled type 2 diabetes mellitus without complication, without long-term current use of insulin (HCC)  E11.9 POCT glycosylated hemoglobin (Hb A1C)    2. 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  No changes.  Check vitamin deficiencies, female pattern hair loss.  Left-sided knee osteoarthritis has progressed a lot compared to her prior x-rays 5 years ago.  She now has advanced medial compartmental osteoarthritis with moderate to advanced osteoarthritis in general.  She has no joint space remaining in the medial compartment.  We are going to do an intra-articular injection of corticosteroid  today.  She is interested in doing viscosupplementation injections in the future, as well.  We will check on their prior Auth requirements.  Aspiration/Injection Procedure Note Alean Mishkin [birth year] Date of procedure: 01/02/2023  Procedure: Large Joint Aspiration / Injection of Knee, L Indications: Pain  Procedure Details Patient verbally consented to procedure. Risks, benefits, and alternatives explained. Sterilely prepped with Chloraprep. Ethyl cholride used for anesthesia. 9 cc Lidocaine 1% mixed with 1 mL of Kenalog 40 mg injected using the anteromedial approach without difficulty. No complications with procedure and tolerated well. Patient had decreased pain post-injection. Medication: 1 mL of Kenalog 40 mg   Medication Management during today's office visit: Meds ordered this encounter  Medications   triamcinolone acetonide (KENALOG-40) injection 40 mg   There are no discontinued medications.  Orders placed today for conditions managed today: Orders Placed This Encounter  Procedures   DG Knee 4 Views W/Patella Left   POCT glycosylated hemoglobin (Hb A1C)    Disposition: Return in about 6 months (around 07/05/2023) for follow-up with Dr. Lorelei Pont diabetes follow-up.  Dr[employer] One speech-to-text software was used for transcription in this dictation.  Possible transcriptional errors can occur using Editor, commissioning.   Signed,  [name]. Tayveon Lombardo, MD   Outpatient Encounter Medications as of 01/02/2023  Medication Sig   albuterol (VENTOLIN HFA) 108 (90 Base) MCG/ACT inhaler Inhale 2 puffs into the lungs every 4 (four) hours as needed for wheezing.   amLODipine (NORVASC) 10 MG tablet TAKE 1 TABLET BY MOUTH ONCE A DAY   atorvastatin (LIPITOR) 20 MG tablet TAKE 1 TABLET BY MOUTH ONCE DAILY   Cholecalciferol (VITAMIN D) 50 MCG (2000 UT) CAPS Take 2,000 Units by mouth daily at 12 noon.   clindamycin (CLEOCIN T) 1 % lotion Apply topically.   clonazePAM  (KLONOPIN) 0.5 MG tablet Take 1 tablet (0.5 mg total) by mouth 2 (two) times daily as needed for anxiety.   FLUoxetine (PROZAC) 20 MG capsule Take 1 capsule (20 mg total) by mouth daily.   levothyroxine (SYNTHROID) 50 MCG tablet TAKE 1 TABLET BY MOUTH ONCE A DAY. TAKE ON AN EMPTY STOMACH WITH A GLASS OF WATER ATLEAST 30-60 MIN BEFORE BREAKFAST   losartan-hydrochlorothiazide (HYZAAR) 100-25 MG tablet TAKE 1 TABLET BY MOUTH ONCE DAILY   metFORMIN (GLUCOPHAGE-XR) 500 MG 24 hr tablet Take 500 mg by mouth daily with breakfast.   ONE TOUCH ULTRA TEST test strip USE TO CHECK BLOOD SUGAR TWICE A DAY AND AS DIRECTED   ONETOUCH DELICA LANCETS 99991111 MISC Use to check blood sugar two times a day   [EXPIRED] triamcinolone acetonide (KENALOG-40) injection 40 mg    No facility-administered encounter medications on file as of 01/02/2023.

## 2023-01-03 ENCOUNTER — Encounter: Payer: Self-pay | Admitting: Family Medicine

## 2023-01-03 LAB — IBC + FERRITIN
Ferritin: 28.7 ng/mL (ref 10.0–291.0)
Iron: 37 ug/dL — ABNORMAL LOW (ref 42–145)
Saturation Ratios: 9.8 % — ABNORMAL LOW (ref 20.0–50.0)
TIBC: 379.4 ug/dL (ref 250.0–450.0)
Transferrin: 271 mg/dL (ref 212.0–360.0)

## 2023-01-03 LAB — VITAMIN D 25 HYDROXY (VIT D DEFICIENCY, FRACTURES): VITD: 29.35 ng/mL — ABNORMAL LOW (ref 30.00–100.00)

## 2023-01-03 LAB — FOLATE: Folate: 8.8 ng/mL (ref >5.9)

## 2023-01-03 LAB — VITAMIN B12: Vitamin B-12: 173 pg/mL — ABNORMAL LOW (ref 211–911)

## 2023-01-09 ENCOUNTER — Other Ambulatory Visit: Payer: Self-pay | Admitting: Family Medicine

## 2023-01-09 DIAGNOSIS — Z1231 Encounter for screening mammogram for malignant neoplasm of breast: Secondary | ICD-10-CM

## 2023-01-24 ENCOUNTER — Ambulatory Visit
Admission: RE | Admit: 2023-01-24 | Discharge: 2023-01-24 | Disposition: A | Discharge status: Home / Self Care | Payer: Medicare Other | Source: Ambulatory Visit | Attending: Family Medicine | Admitting: Family Medicine

## 2023-01-24 DIAGNOSIS — Z1231 Encounter for screening mammogram for malignant neoplasm of breast: Secondary | ICD-10-CM | POA: Diagnosis not present

## 2023-02-08 ENCOUNTER — Telehealth: Payer: Self-pay

## 2023-02-08 NOTE — Progress Notes (Signed)
Care Management & Coordination Services Pharmacy Team  Reason for Encounter: Medication adherence  Spoke with  [facility]  on 02/08/2023   Patient is not more than 5 days past due for refill on the following medications per chart history:  Drug Name / Strength  Metformin 500 mg - last fill 11/03/2022 90ds  Al Corpus, PharmD notified  Claudina Lick, [place] Clinical Pharmacy Assistant [phone]

## 2023-02-09 ENCOUNTER — Telehealth: Payer: Self-pay | Admitting: Family Medicine

## 2023-02-09 NOTE — Telephone Encounter (Signed)
Called patient to schedule Medicare Annual Wellness Visit (AWV). Unable to reach patient. Re-scheduled AWV for 03/07/2023 to 02/23/2023. Left a detailed message on patient cell.  AWV: 02/23/2023  Please schedule an appointment at any time with [facility].  If any questions, please contact me at [phone].  Thank you ,  Randon Goldsmith Care Guide [facility] AWV TEAM Direct Dial: [phone]

## 2023-02-22 ENCOUNTER — Other Ambulatory Visit: Payer: Self-pay | Admitting: Family Medicine

## 2023-02-22 NOTE — Telephone Encounter (Signed)
Please schedule CPE with fasting labs prior with Dr. Patsy Lager after 03/15/2023 scheduled.

## 2023-02-22 NOTE — Telephone Encounter (Signed)
Not able to lvm.

## 2023-02-23 ENCOUNTER — Ambulatory Visit (INDEPENDENT_AMBULATORY_CARE_PROVIDER_SITE_OTHER): Payer: Medicare Other

## 2023-02-23 VITALS — Ht 59.0 in | Wt 165.0 lb

## 2023-02-23 DIAGNOSIS — Z780 Asymptomatic menopausal state: Secondary | ICD-10-CM

## 2023-02-23 DIAGNOSIS — Z0000 Encounter for general adult medical examination without abnormal findings: Secondary | ICD-10-CM

## 2023-02-23 NOTE — Progress Notes (Signed)
I connected with  Jennefer Bravo on 02/23/23 by a audio enabled telemedicine application and verified that I am speaking with the correct person using two identifiers.  Patient Location: Home  Provider Location: Home Office  I discussed the limitations of evaluation and management by telemedicine. The patient expressed understanding and agreed to proceed.  Subjective:   Robin Bass is a 72 y.o. female who presents for Medicare Annual (Subsequent) preventive examination.  Review of Systems      Cardiac Risk Factors include: advanced age (>42men, >13 women);hypertension;diabetes mellitus;sedentary lifestyle     Objective:    Today's Vitals   02/23/23 1258  Weight: 165 lb (74.8 kg)  Height: 4\' 11"  (1.499 m)   Body mass index is 33.33 kg/m.     02/23/2023    1:14 PM 09/06/2022    3:37 PM 03/03/2022    1:52 PM 03/02/2021    2:04 PM 03/02/2020   10:06 AM 12/11/2019    2:47 PM 12/02/2019   10:54 AM  Advanced Directives  Does Patient Have a Medical Advance Directive? Yes No Yes Yes No Yes No  Type of Estate agent of [place];Living will  Healthcare Power of [place];Living will Healthcare Power of [place];Living will  Healthcare Power of [place];Living will   Does patient want to make changes to medical advance directive?   Yes (Inpatient - patient defers changing a medical advance directive and declines information at this time)      Copy of Healthcare Power of Attorney in Chart? No - copy requested  No - copy requested No - copy requested  No - copy requested     Current Medications (verified) Outpatient Encounter Medications as of 02/23/2023  Medication Sig   albuterol (VENTOLIN HFA) 108 (90 Base) MCG/ACT inhaler Inhale 2 puffs into the lungs every 4 (four) hours as needed for wheezing.   amLODipine (NORVASC) 10 MG tablet TAKE ONE TABLET BY MOUTH ONCE A DAY   atorvastatin (LIPITOR) 20 MG tablet TAKE 1 TABLET BY MOUTH ONCE DAILY    Cholecalciferol (VITAMIN D) 50 MCG (2000 UT) CAPS Take 2,000 Units by mouth daily at 12 noon.   clindamycin (CLEOCIN T) 1 % lotion Apply topically.   clonazePAM (KLONOPIN) 0.5 MG tablet Take 1 tablet (0.5 mg total) by mouth 2 (two) times daily as needed for anxiety.   FLUoxetine (PROZAC) 20 MG capsule Take 1 capsule (20 mg total) by mouth daily.   levothyroxine (SYNTHROID) 50 MCG tablet TAKE ONE TAB BY MOUTH ONCE DAILY. TAKE ON AN EMPTY STOMACH WITH A GLASS OF WATER ATLEAST 30-60 MINUTES BEFORE BREAKFAST   losartan-hydrochlorothiazide (HYZAAR) 100-25 MG tablet TAKE 1 TABLET BY MOUTH ONCE DAILY   metFORMIN (GLUCOPHAGE-XR) 500 MG 24 hr tablet Take 500 mg by mouth daily with breakfast.   ONE TOUCH ULTRA TEST test strip USE TO CHECK BLOOD SUGAR TWICE A DAY AND AS DIRECTED   ONETOUCH DELICA LANCETS 33G MISC Use to check blood sugar two times a day   No facility-administered encounter medications on file as of 02/23/2023.    Allergies (verified) Sulfonamide derivatives and Ace inhibitors   History: Past Medical History:  Diagnosis Date   Asthma    Basal cell carcinoma    skin   COPD (chronic obstructive pulmonary disease) (HCC)    Diabetes mellitus type 2, controlled, without complications (HCC) 08/21/2014   GERD (gastroesophageal reflux disease)    History of pituitary tumor    Hypothyroidism    Overactive bladder  Past Surgical History:  Procedure Laterality Date   ABDOMINAL HYSTERECTOMY     Family History  Problem Relation Age of Onset   Hypertension Mother    Heart attack Father    Breast cancer Neg Hx    Social History   Socioeconomic History   Marital status: Married    Spouse name: Not on file   Number of children: 1   Years of education: Not on file   Highest education level: Not on file  Occupational History   Occupation: Producer, television/film/video: UNEMPLOYED  Tobacco Use   Smoking status: Never   Smokeless tobacco: Never  Vaping Use   Vaping Use: Never used   Substance and Sexual Activity   Alcohol use: No   Drug use: No   Sexual activity: Not on file  Other Topics Concern   Not on file  Social History Narrative   Not on file   Social Determinants of Health   Financial Resource Strain: Low Risk  (02/23/2023)   Overall Financial Resource Strain (CARDIA)    Difficulty of Paying Living Expenses: Not hard at all  Food Insecurity: No Food Insecurity (02/23/2023)   Hunger Vital Sign    Worried About Running Out of Food in the Last Year: Never true    Ran Out of Food in the Last Year: Never true  Transportation Needs: No Transportation Needs (02/23/2023)   PRAPARE - Administrator, Civil Service (Medical): No    Lack of Transportation (Non-Medical): No  Physical Activity: Inactive (02/23/2023)   Exercise Vital Sign    Days of Exercise per Week: 0 days    Minutes of Exercise per Session: 0 min  Stress: No Stress Concern Present (02/23/2023)   [employer] of Occupational Health - Occupational Stress Questionnaire    Feeling of Stress : Only a little  Social Connections: Moderately Isolated (02/23/2023)   Social Connection and Isolation Panel [NHANES]    Frequency of Communication with Friends and Family: More than three times a week    Frequency of Social Gatherings with Friends and Family: More than three times a week    Attends Religious Services: Never    Database administrator or Organizations: No    Attends Engineer, structural: Never    Marital Status: Married    Tobacco Counseling Counseling given: Not Answered   Clinical Intake:  Pre-visit preparation completed: Yes  Pain : No/denies pain     Nutritional Risks: None Diabetes: Yes CBG done?: No Did pt. bring in CBG monitor from home?: No  How often do you need to have someone help you when you read instructions, pamphlets, or other written materials from your doctor or pharmacy?: 1 - Never  Diabetic?Nutrition Risk Assessment:  Has the patient had  any N/V/D within the last 2 months?  No  Does the patient have any non-healing wounds?  No  Has the patient had any unintentional weight loss or weight gain?  No   Diabetes:  Is the patient diabetic?  Yes  If diabetic, was a CBG obtained today?  No  Did the patient bring in their glucometer from home?  No  How often do you monitor your CBG's? QOD.   Financial Strains and Diabetes Management:  Are you having any financial strains with the device, your supplies or your medication? No .  Does the patient want to be seen by Chronic Care Management for management of their diabetes?  No  Would the  patient like to be referred to a Nutritionist or for Diabetic Management?  No   Diabetic Exams:  Diabetic Eye Exam: Completed 12/08/2022 [facility] Diabetic Foot Exam: Completed 03/12/21 PCP    Interpreter Needed?: No  Information entered by :: C.Maurio Baize LPN   Activities of Daily Living    02/23/2023    1:14 PM 03/03/2022    1:54 PM  In your present state of health, do you have any difficulty performing the following activities:  Hearing? 0 0  Vision? 0 0  Difficulty concentrating or making decisions? 0 0  Walking or climbing stairs? 0 0  Dressing or bathing? 0 0  Doing errands, shopping? 0 0  Preparing Food and eating ? N N  Using the Toilet? N N  In the past six months, have you accidently leaked urine? Y N  Comment at night   Do you have problems with loss of bowel control? N N  Managing your Medications? N N  Managing your Finances? N N  Housekeeping or managing your Housekeeping? N N    Patient Care Team: Hannah Beat, MD as PCP - General Lenice Llamas Garry Heater, [facility] as Pharmacist (Pharmacist)  Indicate any recent Medical Services you may have received from other than [facility] providers in the past year (date may be approximate).     Assessment:   This is a routine wellness examination for Robin Bass.  Hearing/Vision screen Hearing Screening - Comments:: No aids Vision  Screening - Comments:: Readers- [facility]  Dietary issues and exercise activities discussed: Current Exercise Habits: The patient does not participate in regular exercise at present, Exercise limited by: Other - see comments   Goals Addressed             This Visit's Progress    Patient Stated       Keep weight down.       Depression Screen    02/23/2023    1:12 PM 03/03/2022    1:51 PM 03/02/2021    2:06 PM 06/08/2020    3:09 PM 12/11/2019    2:50 PM 11/19/2018   11:23 AM 09/11/2017    9:12 AM  PHQ 2/9 Scores  PHQ - 2 Score 0 0 0 0 0 0 0  PHQ- 9 Score   0  0      Fall Risk    02/23/2023    1:14 PM 01/02/2023    3:08 PM 03/03/2022    1:53 PM 03/02/2021    2:05 PM 12/11/2019    2:48 PM  Fall Risk   Falls in the past year? 0 0 0 0 0  Number falls in past yr: 0 0 0 0 0  Injury with Fall? 0 0 0 0 0  Risk for fall due to : No Fall Risks No Fall Risks No Fall Risks Medication side effect Medication side effect  Follow up Falls prevention discussed;Falls evaluation completed Falls evaluation completed Falls evaluation completed Falls evaluation completed;Falls prevention discussed Falls evaluation completed;Falls prevention discussed    FALL RISK PREVENTION PERTAINING TO THE HOME:  Any stairs in or around the home? Yes  If so, are there any without handrails? No  Home free of loose throw rugs in walkways, pet beds, electrical cords, etc? Yes  Adequate lighting in your home to reduce risk of falls? Yes   ASSISTIVE DEVICES UTILIZED TO PREVENT FALLS:  Life alert? No  Use of a cane, walker or w/c? No  Grab bars in the bathroom? No  Shower chair  or bench in shower? Yes  Elevated toilet seat or a handicapped toilet? No    Cognitive Function:    03/02/2021    2:09 PM 12/11/2019    2:52 PM  MMSE - Mini Mental State Exam  Orientation to time 5 5  Orientation to Place 5 5  Registration 3 3  Attention/ Calculation 5 5  Recall 3 3  Language- repeat 1 1         02/23/2023    1:15 PM  6CIT Screen  What Year? 0 points  What month? 0 points  What time? 0 points  Count back from 20 0 points  Months in reverse 0 points  Repeat phrase 0 points  Total Score 0 points    Immunizations Immunization History  Administered Date(s) Administered   COVID-19, mRNA, vaccine(Comirnaty)12 years and older 08/28/2022   Fluad Quad(high Dose 65+) 07/01/2019, 08/01/2022   Influenza Whole 07/31/2008, 07/12/2010   Influenza, Seasonal, Injecte, Preservative Fre 07/08/2015, 07/26/2016, 08/02/2017   Influenza,inj,Quad PF,6+ Mos 07/13/2018   PFIZER(Purple Top)SARS-COV-2 Vaccination 11/29/2019, 12/24/2019, 07/23/2020, 03/05/2021   Pneumococcal Conjugate-13 08/21/2014   Pneumococcal Polysaccharide-23 09/07/2015, 03/11/2021   Tdap 04/01/2014    TDAP status: Up to date  Flu Vaccine status: Up to date  Pneumococcal vaccine status: Up to date  Covid-19 vaccine status: Completed vaccines  Qualifies for Shingles Vaccine? Yes   Zostavax completed Yes   Shingrix Completed?: Yes  Screening Tests Health Maintenance  Topic Date Due   Zoster Vaccines- Shingrix (1 of 2) Never done   COLONOSCOPY (Pts 45-51yrs Insurance coverage will need to be confirmed)  Never done   DEXA SCAN  Never done   FOOT EXAM  03/12/2022   COVID-19 Vaccine (6 - 2023-24 season) 10/23/2022   Diabetic kidney evaluation - eGFR measurement  03/10/2023   Diabetic kidney evaluation - Urine ACR  03/15/2023   INFLUENZA VACCINE  05/25/2023   HEMOGLOBIN A1C  07/05/2023   OPHTHALMOLOGY EXAM  12/09/2023   Medicare Annual Wellness (AWV)  02/23/2024   DTaP/Tdap/Td (2 - Td or Tdap) 04/01/2024   MAMMOGRAM  01/23/2025   Pneumonia Vaccine 32+ Years old  Completed   Hepatitis C Screening  Completed   HPV VACCINES  Aged Out    Health Maintenance  Health Maintenance Due  Topic Date Due   Zoster Vaccines- Shingrix (1 of 2) Never done   COLONOSCOPY (Pts 45-41yrs Insurance coverage will need to be  confirmed)  Never done   DEXA SCAN  Never done   FOOT EXAM  03/12/2022   COVID-19 Vaccine (6 - 2023-24 season) 10/23/2022   Diabetic kidney evaluation - eGFR measurement  03/10/2023   Diabetic kidney evaluation - Urine ACR  03/15/2023    Colorectal cancer screening: No longer required.  Pt declined.  Mammogram status: Completed 01/24/2023. Repeat every year  Bone Density status: Ordered 03/03/22. Pt provided with contact info and advised to call to schedule appt.  Lung Cancer Screening: (Low Dose CT Chest recommended if Age 67-80 years, 30 pack-year currently smoking OR have quit w/in 15years.) does not qualify.   Lung Cancer Screening Referral: no  Additional Screening:  Hepatitis C Screening: does qualify; Completed 09/05/2016  Vision Screening: Recommended annual ophthalmology exams for early detection of glaucoma and other disorders of the eye. Is the patient up to date with their annual eye exam?  Yes  Who is the provider or what is the name of the office in which the patient attends annual eye exams? 12/05/22 If pt is  not established with a provider, would they like to be referred to a provider to establish care? Yes .   Dental Screening: Recommended annual dental exams for proper oral hygiene  Community Resource Referral / Chronic Care Management: CRR required this visit?  No   CCM required this visit?  No      Plan:     I have personally reviewed and noted the following in the patient's chart:   Medical and social history Use of alcohol, tobacco or illicit drugs  Current medications and supplements including opioid prescriptions. Patient is not currently taking opioid prescriptions. Functional ability and status Nutritional status Physical activity Advanced directives List of other physicians Hospitalizations, surgeries, and ER visits in previous 12 months Vitals Screenings to include cognitive, depression, and falls Referrals and appointments  In  addition, I have reviewed and discussed with patient certain preventive protocols, quality metrics, and best practice recommendations. A written personalized care plan for preventive services as well as general preventive health recommendations were provided to patient.     Maryan Puls, LPN   10/29/1094   Nurse Notes: Pt stated she will call to schedule dexa scan.

## 2023-02-23 NOTE — Patient Instructions (Signed)
Robin Bass , Thank you for taking time to come for your Medicare Wellness Visit. I appreciate your ongoing commitment to your health goals. Please review the following plan we discussed and let me know if I can assist you in the future.   These are the goals we discussed:  Goals      DIET - EAT MORE FRUITS AND VEGETABLES     Patient Stated     12/11/2019, I will continue doing yoga on Wednesdays for 1 hour.      Patient Stated     03/02/2021, I will maintain and continue medications as prescribed.      Patient Stated     Keep weight down.     Pharmacy Care Plan     CARE PLAN ENTRY (see longitudinal plan of care for additional care plan information)  Current Barriers:  Chronic Disease Management support, education, and care coordination needs related to Hypertension, Hyperlipidemia, and Diabetes   Hypertension BP Readings from Last 3 Encounters:  03/26/20 112/60  03/02/20 137/80  12/16/19 132/70  Pharmacist Clinical Goal(s): Over the next 90 days, patient will work with PharmD and providers to maintain BP goal <130/80 Current regimen:  Losartan-hydrochlorothiazide 100-25 mg daily Amlodipine 10 mg dialy Interventions: Discussed patient's blood pressure is at goal in office on current therapy.  Patient does not check bp at home.  Patient is working to resume yoga at home.  Patient self care activities - Over the next 90 days, patient will: Check BP if symptomatic, document, and provide at future appointments Ensure daily salt intake < 2300 mg/day  Hyperlipidemia Lab Results  Component Value Date/Time   LDLCALC 80 12/11/2019 08:47 AM   LDLDIRECT 109.6 05/13/2013 04:36 PM  Pharmacist Clinical Goal(s): Over the next 90 days, patient will work with PharmD and providers to achieve LDL goal < 70 Current regimen:  Atorvastatin 20 mg daily Interventions: Patient reports improved diet since last lipid panel. Recommend patient resume exercise at home for optimal heart health.   Discussed the benefits of taking atorvastatin for heart and attack and stroke prevention.  Patient self care activities - Over the next 90 days, patient will: Continue to take atorvastatin 20 mg daily.  Continue to eat healthy.  Work to resume yoga classes at home.   Diabetes Lab Results  Component Value Date/Time   HGBA1C 6.9 (A) 03/26/2020 02:13 PM   HGBA1C 7.9 (H) 12/11/2019 08:47 AM   HGBA1C 6.9 (H) 11/15/2018 08:15 AM  Pharmacist Clinical Goal(s): Over the next 90 days, patient will work with PharmD and providers to maintain A1c goal <7% Current regimen:  Metformin XR 500 mg daily Interventions: Continue to check blood sugar every other day.  Continue to eat a diet rich in lean proteins, fruits, vegetables and whole grains.  Keep up the good work being mindful of carbohydrate portion sizes and balancing your meals.  Patient self care activities - Over the next 90 days, patient will: Check blood sugar once daily every other day, document, and provide at future appointments Contact provider with any episodes of hypoglycemia  Bone Health Pharmacist Clinical Goal(s) Over the next 90 days, patient will work with PharmD and providers to get updated Dexa scan ordered Current regimen:  Vitamin D 2000 units daily Interventions: Encouraged patient to work toward a goal of 1200 mg each day of calcium through diet and supplementation.  Encouraged patient resume exercise for bone health.  Patient self care activities - Over the next 90 days, patient will: Schedule  Dexa scan.  Work toward daily goal of 1200 mg daily of Calcium  Resume yoga class online at home.  Anxiety Pharmacist Clinical Goal(s) Over the next 90 days, patient will work with PharmD and providers to improve anxiety.  Current regimen:  Fluoxetine 20 mg daily Clonazepam 0.5 mg bid prn anxiety Interventions: Discussed patient's current medication regimen.  Encouraged patient resume exercise for mental health and  anxiety improvement.   Patient reserves clonazepam for more severe anxiety but feels that something milder would be helpful for less severe anxiety.  Patient self care activities - Over the next 90 days, patient will: Consider beginning buspirone as needed for anxiety.  Resume yoga class online at home.   Medication management Pharmacist Clinical Goal(s): Over the next 90 days, patient will work with PharmD and providers to maintain optimal medication adherence Current pharmacy: [facility] Interventions Comprehensive medication review performed. Continue current medication management strategy Patient self care activities - Over the next 90 days, patient will: Focus on medication adherence by continuing to use pill box.  Take medications as prescribed Report any questions or concerns to PharmD and/or provider(s)  Initial goal documentation         This is a list of the screening recommended for you and due dates:  Health Maintenance  Topic Date Due   Zoster (Shingles) Vaccine (1 of 2) Never done   Colon Cancer Screening  Never done   DEXA scan (bone density measurement)  Never done   Complete foot exam   03/12/2022   COVID-19 Vaccine (6 - 2023-24 season) 10/23/2022   Yearly kidney function blood test for diabetes  03/10/2023   Yearly kidney health urinalysis for diabetes  03/15/2023   Flu Shot  05/25/2023   Hemoglobin A1C  07/05/2023   Eye exam for diabetics  12/09/2023   Medicare Annual Wellness Visit  02/23/2024   DTaP/Tdap/Td vaccine (2 - Td or Tdap) 04/01/2024   Mammogram  01/23/2025   Pneumonia Vaccine  Completed   Hepatitis C Screening: USPSTF Recommendation to screen - Ages 46-79 yo.  Completed   HPV Vaccine  Aged Out    Advanced directives: Please bring a copy of your health care power of attorney and living will to the office to be added to your chart at your convenience.   Conditions/risks identified: Aim for 30 minutes of exercise or brisk walking,  6-8 glasses of water, and 5 servings of fruits and vegetables each day.   Next appointment: Follow up in one year for your annual wellness visit 02/27/24 @ 8:30 televisit   Preventive Care 65 Years and Older, Female Preventive care refers to lifestyle choices and visits with your health care provider that can promote health and wellness. What does preventive care include? A yearly physical exam. This is also called an annual well check. Dental exams once or twice a year. Routine eye exams. Ask your health care provider how often you should have your eyes checked. Personal lifestyle choices, including: Daily care of your teeth and gums. Regular physical activity. Eating a healthy diet. Avoiding tobacco and drug use. Limiting alcohol use. Practicing safe sex. Taking low-dose aspirin every day. Taking vitamin and mineral supplements as recommended by your health care provider. What happens during an annual well check? The services and screenings done by your health care provider during your annual well check will depend on your age, overall health, lifestyle risk factors, and family history of disease. Counseling  Your health care provider may ask you questions  about your: Alcohol use. Tobacco use. Drug use. Emotional well-being. Home and relationship well-being. Sexual activity. Eating habits. History of falls. Memory and ability to understand (cognition). Work and work Astronomer. Reproductive health. Screening  You may have the following tests or measurements: Height, weight, and BMI. Blood pressure. Lipid and cholesterol levels. These may be checked every 5 years, or more frequently if you are over 78 years old. Skin check. Lung cancer screening. You may have this screening every year starting at age 76 if you have a 30-pack-year history of smoking and currently smoke or have quit within the past 15 years. Fecal occult blood test (FOBT) of the stool. You may have this test  every year starting at age 42. Flexible sigmoidoscopy or colonoscopy. You may have a sigmoidoscopy every 5 years or a colonoscopy every 10 years starting at age 69. Hepatitis C blood test. Hepatitis B blood test. Sexually transmitted disease (STD) testing. Diabetes screening. This is done by checking your blood sugar (glucose) after you have not eaten for a while (fasting). You may have this done every 1-3 years. Bone density scan. This is done to screen for osteoporosis. You may have this done starting at age 38. Mammogram. This may be done every 1-2 years. Talk to your health care provider about how often you should have regular mammograms. Talk with your health care provider about your test results, treatment options, and if necessary, the need for more tests. Vaccines  Your health care provider may recommend certain vaccines, such as: Influenza vaccine. This is recommended every year. Tetanus, diphtheria, and acellular pertussis (Tdap, Td) vaccine. You may need a Td booster every 10 years. Zoster vaccine. You may need this after age 105. Pneumococcal 13-valent conjugate (PCV13) vaccine. One dose is recommended after age 52. Pneumococcal polysaccharide (PPSV23) vaccine. One dose is recommended after age 10. Talk to your health care provider about which screenings and vaccines you need and how often you need them. This information is not intended to replace advice given to you by your health care provider. Make sure you discuss any questions you have with your health care provider. Document Released: 11/06/2015 Document Revised: 06/29/2016 Document Reviewed: 08/11/2015 Elsevier Interactive Patient Education  2017 [employer].  Fall Prevention in the Home Falls can cause injuries. They can happen to people of all ages. There are many things you can do to make your home safe and to help prevent falls. What can I do on the outside of my home? Regularly fix the edges of walkways and driveways  and fix any cracks. Remove anything that might make you trip as you walk through a door, such as a raised step or threshold. Trim any bushes or trees on the path to your home. Use bright outdoor lighting. Clear any walking paths of anything that might make someone trip, such as rocks or tools. Regularly check to see if handrails are loose or broken. Make sure that both sides of any steps have handrails. Any raised decks and porches should have guardrails on the edges. Have any leaves, snow, or ice cleared regularly. Use sand or salt on walking paths during winter. Clean up any spills in your garage right away. This includes oil or grease spills. What can I do in the bathroom? Use night lights. Install grab bars by the toilet and in the tub and shower. Do not use towel bars as grab bars. Use non-skid mats or decals in the tub or shower. If you need to sit down  in the shower, use a plastic, non-slip stool. Keep the floor dry. Clean up any water that spills on the floor as soon as it happens. Remove soap buildup in the tub or shower regularly. Attach bath mats securely with double-sided non-slip rug tape. Do not have throw rugs and other things on the floor that can make you trip. What can I do in the bedroom? Use night lights. Make sure that you have a light by your bed that is easy to reach. Do not use any sheets or blankets that are too big for your bed. They should not hang down onto the floor. Have a firm chair that has side arms. You can use this for support while you get dressed. Do not have throw rugs and other things on the floor that can make you trip. What can I do in the kitchen? Clean up any spills right away. Avoid walking on wet floors. Keep items that you use a lot in easy-to-reach places. If you need to reach something above you, use a strong step stool that has a grab bar. Keep electrical cords out of the way. Do not use floor polish or wax that makes floors slippery. If  you must use wax, use non-skid floor wax. Do not have throw rugs and other things on the floor that can make you trip. What can I do with my stairs? Do not leave any items on the stairs. Make sure that there are handrails on both sides of the stairs and use them. Fix handrails that are broken or loose. Make sure that handrails are as long as the stairways. Check any carpeting to make sure that it is firmly attached to the stairs. Fix any carpet that is loose or worn. Avoid having throw rugs at the top or bottom of the stairs. If you do have throw rugs, attach them to the floor with carpet tape. Make sure that you have a light switch at the top of the stairs and the bottom of the stairs. If you do not have them, ask someone to add them for you. What else can I do to help prevent falls? Wear shoes that: Do not have high heels. Have rubber bottoms. Are comfortable and fit you well. Are closed at the toe. Do not wear sandals. If you use a stepladder: Make sure that it is fully opened. Do not climb a closed stepladder. Make sure that both sides of the stepladder are locked into place. Ask someone to hold it for you, if possible. Clearly mark and make sure that you can see: Any grab bars or handrails. First and last steps. Where the edge of each step is. Use tools that help you move around (mobility aids) if they are needed. These include: Canes. Walkers. Scooters. Crutches. Turn on the lights when you go into a dark area. Replace any light bulbs as soon as they burn out. Set up your furniture so you have a clear path. Avoid moving your furniture around. If any of your floors are uneven, fix them. If there are any pets around you, be aware of where they are. Review your medicines with your doctor. Some medicines can make you feel dizzy. This can increase your chance of falling. Ask your doctor what other things that you can do to help prevent falls. This information is not intended to  replace advice given to you by your health care provider. Make sure you discuss any questions you have with your health care provider. Document  Released: 08/06/2009 Document Revised: 03/17/2016 Document Reviewed: 11/14/2014 Elsevier Interactive Patient Education  2017 [employer].

## 2023-03-01 ENCOUNTER — Other Ambulatory Visit: Payer: Self-pay | Admitting: Family Medicine

## 2023-03-09 ENCOUNTER — Other Ambulatory Visit: Payer: Self-pay | Admitting: Family Medicine

## 2023-03-09 DIAGNOSIS — E785 Hyperlipidemia, unspecified: Secondary | ICD-10-CM

## 2023-03-09 DIAGNOSIS — E038 Other specified hypothyroidism: Secondary | ICD-10-CM

## 2023-03-09 DIAGNOSIS — E538 Deficiency of other specified B group vitamins: Secondary | ICD-10-CM

## 2023-03-09 DIAGNOSIS — E119 Type 2 diabetes mellitus without complications: Secondary | ICD-10-CM

## 2023-03-09 DIAGNOSIS — Z79899 Other long term (current) drug therapy: Secondary | ICD-10-CM

## 2023-03-09 DIAGNOSIS — E559 Vitamin D deficiency, unspecified: Secondary | ICD-10-CM

## 2023-03-16 ENCOUNTER — Other Ambulatory Visit (INDEPENDENT_AMBULATORY_CARE_PROVIDER_SITE_OTHER): Payer: Medicare Other

## 2023-03-16 DIAGNOSIS — E119 Type 2 diabetes mellitus without complications: Secondary | ICD-10-CM | POA: Diagnosis not present

## 2023-03-16 DIAGNOSIS — E785 Hyperlipidemia, unspecified: Secondary | ICD-10-CM

## 2023-03-16 DIAGNOSIS — Z79899 Other long term (current) drug therapy: Secondary | ICD-10-CM

## 2023-03-16 DIAGNOSIS — E038 Other specified hypothyroidism: Secondary | ICD-10-CM

## 2023-03-16 DIAGNOSIS — E559 Vitamin D deficiency, unspecified: Secondary | ICD-10-CM

## 2023-03-16 DIAGNOSIS — E538 Deficiency of other specified B group vitamins: Secondary | ICD-10-CM | POA: Diagnosis not present

## 2023-03-16 LAB — BASIC METABOLIC PANEL
BUN: 19 mg/dL (ref 6–23)
CO2: 31 mEq/L (ref 19–32)
Calcium: 9.8 mg/dL (ref 8.4–10.5)
Chloride: 102 mEq/L (ref 96–112)
Creatinine, Ser: 0.92 mg/dL (ref 0.40–1.20)
GFR: 63.22 mL/min (ref >60.00)
Glucose, Bld: 124 mg/dL — ABNORMAL HIGH (ref 70–99)
Potassium: 3.6 mEq/L (ref 3.5–5.1)
Sodium: 142 mEq/L (ref 135–145)

## 2023-03-16 LAB — HEPATIC FUNCTION PANEL
ALT: 10 U/L (ref 0–35)
AST: 11 U/L (ref 0–37)
Albumin: 3.9 g/dL (ref 3.5–5.2)
Alkaline Phosphatase: 111 U/L (ref 39–117)
Bilirubin, Direct: 0.1 mg/dL (ref 0.0–0.3)
Total Bilirubin: 0.4 mg/dL (ref 0.2–1.2)
Total Protein: 6.6 g/dL (ref 6.0–8.3)

## 2023-03-16 LAB — MICROALBUMIN / CREATININE URINE RATIO
Creatinine,U: 107.5 mg/dL
Microalb Creat Ratio: 0.7 mg/g (ref 0.0–30.0)
Microalb, Ur: <0.7 mg/dL (ref 0.0–1.9)

## 2023-03-16 LAB — LIPID PANEL
Cholesterol: 147 mg/dL (ref 0–200)
HDL: 66.1 mg/dL (ref >39.00)
LDL Cholesterol: 66 mg/dL (ref 0–99)
NonHDL: 80.94
Total CHOL/HDL Ratio: 2
Triglycerides: 77 mg/dL (ref 0.0–149.0)
VLDL: 15.4 mg/dL (ref 0.0–40.0)

## 2023-03-16 LAB — CBC WITH DIFFERENTIAL/PLATELET
Basophils Absolute: 0.1 10*3/uL (ref 0.0–0.1)
Basophils Relative: 0.9 % (ref 0.0–3.0)
Eosinophils Absolute: 0.2 10*3/uL (ref 0.0–0.7)
Eosinophils Relative: 2 % (ref 0.0–5.0)
HCT: 37.5 % (ref 36.0–46.0)
Hemoglobin: 12.1 g/dL (ref 12.0–15.0)
Lymphocytes Relative: 31.1 % (ref 12.0–46.0)
Lymphs Abs: 2.6 10*3/uL (ref 0.7–4.0)
MCHC: 32.3 g/dL (ref 30.0–36.0)
MCV: 73.6 fl — ABNORMAL LOW (ref 78.0–100.0)
Monocytes Absolute: 0.5 10*3/uL (ref 0.1–1.0)
Monocytes Relative: 6.3 % (ref 3.0–12.0)
Neutro Abs: 4.9 10*3/uL (ref 1.4–7.7)
Neutrophils Relative %: 59.7 % (ref 43.0–77.0)
Platelets: 393 10*3/uL (ref 150.0–400.0)
RBC: 5.09 Mil/uL (ref 3.87–5.11)
RDW: 16.5 % — ABNORMAL HIGH (ref 11.5–15.5)
WBC: 8.3 10*3/uL (ref 4.0–10.5)

## 2023-03-16 LAB — HEMOGLOBIN A1C: Hgb A1c MFr Bld: 7.1 % — ABNORMAL HIGH (ref 4.6–6.5)

## 2023-03-16 LAB — VITAMIN B12: Vitamin B-12: 1310 pg/mL — ABNORMAL HIGH (ref 211–911)

## 2023-03-16 LAB — TSH: TSH: 4 u[IU]/mL (ref 0.35–5.50)

## 2023-03-16 LAB — VITAMIN D 25 HYDROXY (VIT D DEFICIENCY, FRACTURES): VITD: 32.05 ng/mL (ref 30.00–100.00)

## 2023-03-16 LAB — T4, FREE: Free T4: 1.14 ng/dL (ref 0.60–1.60)

## 2023-03-16 LAB — T3, FREE: T3, Free: 3 pg/mL (ref 2.3–4.2)

## 2023-03-23 ENCOUNTER — Encounter: Payer: Medicare Other | Admitting: Family Medicine

## 2023-03-26 NOTE — Progress Notes (Unsigned)
Myriah Boggus T. Chevie Birkhead, MD, CAQ Sports Medicine [facility] at [facility] [address] [place], [address]  Phone: [phone]  FAX: [phone]  Robin Bass - 72 y.o. female  MRN [id number]  Date of Birth: 11-30-1950  Date: 03/27/2023  PCP: Hannah Beat, MD  Referral: Hannah Beat, MD  No chief complaint on file.  Patient Care Team: Hannah Beat, MD as PCP - General Lenice Llamas Garry Heater, [facility] as Pharmacist (Pharmacist) Subjective:   Robin Bass is a 72 y.o. pleasant patient who presents with the following:  Health Maintenance Summary Reviewed and updated, unless pt declines services.  Tobacco History Reviewed. Non-smoker Alcohol: No concerns, no excessive use Exercise Habits: Some activity, rec at least 30 mins 5 times a week STD concerns: none Drug Use: None Lumps or breast concerns: no  Shingrix Colonoscopy or other colon cancer screening DEXA Foot exam Covid booster  Diabetes Mellitus: Tolerating Medications: yes Compliance with diet: fair, There is no height or weight on file to calculate BMI. Exercise: minimal / intermittent Avg blood sugars at home: not checking Foot problems: none Hypoglycemia: none No nausea, vomitting, blurred vision, polyuria.  Lab Results  Component Value Date   HGBA1C 7.1 (H) 03/16/2023   HGBA1C 7.1 (A) 01/02/2023   HGBA1C 7.4 (H) 03/09/2022   Lab Results  Component Value Date   MICROALBUR <0.7 03/16/2023   LDLCALC 66 03/16/2023   CREATININE 0.92 03/16/2023    Wt Readings from Last 3 Encounters:  02/23/23 165 lb (74.8 kg)  01/02/23 162 lb 8 oz (73.7 kg)  09/12/22 166 lb 8 oz (75.5 kg)     Health Maintenance  Topic Date Due   Zoster Vaccines- Shingrix (1 of 2) Never done   Colonoscopy  Never done   DEXA SCAN  Never done   FOOT EXAM  03/12/2022   COVID-19 Vaccine (6 - 2023-24 season) 10/23/2022   INFLUENZA VACCINE  05/25/2023   HEMOGLOBIN A1C  09/16/2023    OPHTHALMOLOGY EXAM  12/09/2023   Medicare Annual Wellness (AWV)  02/23/2024   Diabetic kidney evaluation - eGFR measurement  03/15/2024   Diabetic kidney evaluation - Urine ACR  03/15/2024   DTaP/Tdap/Td (2 - Td or Tdap) 04/01/2024   MAMMOGRAM  01/23/2025   Pneumonia Vaccine 63+ Years old  Completed   Hepatitis C Screening  Completed   HPV VACCINES  Aged Out    Immunization History  Administered Date(s) Administered   COVID-19, mRNA, vaccine(Comirnaty)12 years and older 08/28/2022   Fluad Quad(high Dose 65+) 07/01/2019, 08/01/2022   Influenza Whole 07/31/2008, 07/12/2010   Influenza, Seasonal, Injecte, Preservative Fre 07/08/2015, 07/26/2016, 08/02/2017   Influenza,inj,Quad PF,6+ Mos 07/13/2018   PFIZER(Purple Top)SARS-COV-2 Vaccination 11/29/2019, 12/24/2019, 07/23/2020, 03/05/2021   Pneumococcal Conjugate-13 08/21/2014   Pneumococcal Polysaccharide-23 09/07/2015, 03/11/2021   Tdap 04/01/2014   Patient Active Problem List   Diagnosis Date Noted   Diabetes mellitus type 2, controlled, without complications (HCC) 08/21/2014    Priority: High   Hypothyroidism 07/31/2008    Priority: Medium    Essential hypertension 07/31/2008    Priority: Medium    Hyperlipidemia LDL goal <70 09/07/2015   Depression 03/31/2012   Overactive bladder 02/05/2011   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   Asthma 07/31/2008   COPD 07/31/2008   GERD 07/31/2008    Past Medical History:  Diagnosis Date   Asthma    Basal cell carcinoma    skin   COPD (chronic obstructive pulmonary disease) (HCC)    Diabetes mellitus type  2, controlled, without complications (HCC) 08/21/2014   GERD (gastroesophageal reflux disease)    History of pituitary tumor    Hypothyroidism    Overactive bladder     Past Surgical History:  Procedure Laterality Date   ABDOMINAL HYSTERECTOMY      Family History  Problem Relation Age of Onset   Hypertension Mother    Heart attack Father    Breast cancer Neg Hx      Social History   Social History Narrative   Not on file    Past Medical History, Surgical History, Social History, Family History, Problem List, Medications, and Allergies have been reviewed and updated if relevant.  Review of Systems: Pertinent positives are listed above.  Otherwise, a full 14 point review of systems has been done in full and it is negative except where it is noted positive.  Objective:   There were no vitals taken for this visit. Ideal Body Weight:   No results found.    02/23/2023    1:12 PM 03/03/2022    1:51 PM 03/02/2021    2:06 PM 06/08/2020    3:09 PM 12/11/2019    2:50 PM  Depression screen PHQ 2/9  Decreased Interest 0 0 0 0 0  Down, Depressed, Hopeless 0 0 0 0 0  PHQ - 2 Score 0 0 0 0 0  Altered sleeping   0  0  Tired, decreased energy   0  0  Change in appetite   0  0  Feeling bad or failure about yourself    0  0  Trouble concentrating   0  0  Moving slowly or fidgety/restless   0  0  Suicidal thoughts   0  0  PHQ-9 Score   0  0  Difficult doing work/chores   Not difficult at all  Not difficult at all     GEN: well developed, well nourished, no acute distress Eyes: conjunctiva and lids normal, PERRLA, EOMI ENT: TM clear, nares clear, oral exam WNL Neck: supple, no lymphadenopathy, no thyromegaly, no JVD Pulm: clear to auscultation and percussion, respiratory effort normal CV: regular rate and rhythm, S1-S2, no murmur, rub or gallop, no bruits Chest: no scars, masses, no lumps BREAST: breast exam declined GI: soft, non-tender; no hepatosplenomegaly, masses; active bowel sounds all quadrants GU: GU exam declined Lymph: no cervical, axillary or inguinal adenopathy MSK: gait normal, muscle tone and strength WNL, no joint swelling, effusions, discoloration, crepitus  SKIN: clear, good turgor, color WNL, no rashes, lesions, or ulcerations Neuro: normal mental status, normal strength, sensation, and motion Psych: alert; oriented to person,  place and time, normally interactive and not anxious or depressed in appearance.   All labs reviewed with patient. Results for orders placed or performed in visit on 03/16/23  Microalbumin / creatinine urine ratio  Result Value Ref Range   Microalb, Ur <0.7 0.0 - 1.9 mg/dL   Creatinine,U 295.6 mg/dL   Microalb Creat Ratio 0.7 0.0 - 30.0 mg/g  Lipid panel  Result Value Ref Range   Cholesterol 147 0 - 200 mg/dL   Triglycerides 21.3 0.0 - 149.0 mg/dL   HDL 08.65 >78.46 mg/dL   VLDL 96.2 0.0 - 95.2 mg/dL   LDL Cholesterol 66 0 - 99 mg/dL   Total CHOL/HDL Ratio 2    NonHDL 80.94   Hemoglobin A1c  Result Value Ref Range   Hgb A1c MFr Bld 7.1 (H) 4.6 - 6.5 %  Hepatic function panel  Result Value  Ref Range   Total Bilirubin 0.4 0.2 - 1.2 mg/dL   Bilirubin, Direct 0.1 0.0 - 0.3 mg/dL   Alkaline Phosphatase 111 39 - 117 U/L   AST 11 0 - 37 U/L   ALT 10 0 - 35 U/L   Total Protein 6.6 6.0 - 8.3 g/dL   Albumin 3.9 3.5 - 5.2 g/dL  CBC with Differential/Platelet  Result Value Ref Range   WBC 8.3 4.0 - 10.5 K/uL   RBC 5.09 3.87 - 5.11 Mil/uL   Hemoglobin 12.1 12.0 - 15.0 g/dL   HCT 40.9 81.1 - 91.4 %   MCV 73.6 (L) 78.0 - 100.0 fl   MCHC 32.3 30.0 - 36.0 g/dL   RDW 78.2 (H) 95.6 - 21.3 %   Platelets 393.0 150.0 - 400.0 K/uL   Neutrophils Relative % 59.7 43.0 - 77.0 %   Lymphocytes Relative 31.1 12.0 - 46.0 %   Monocytes Relative 6.3 3.0 - 12.0 %   Eosinophils Relative 2.0 0.0 - 5.0 %   Basophils Relative 0.9 0.0 - 3.0 %   Neutro Abs 4.9 1.4 - 7.7 K/uL   Lymphs Abs 2.6 0.7 - 4.0 K/uL   Monocytes Absolute 0.5 0.1 - 1.0 K/uL   Eosinophils Absolute 0.2 0.0 - 0.7 K/uL   Basophils Absolute 0.1 0.0 - 0.1 K/uL  Basic metabolic panel  Result Value Ref Range   Sodium 142 135 - 145 mEq/L   Potassium 3.6 3.5 - 5.1 mEq/L   Chloride 102 96 - 112 mEq/L   CO2 31 19 - 32 mEq/L   Glucose, Bld 124 (H) 70 - 99 mg/dL   BUN 19 6 - 23 mg/dL   Creatinine, Ser 0.86 0.40 - 1.20 mg/dL   GFR 57.84  >69.62 mL/min   Calcium 9.8 8.4 - 10.5 mg/dL  Vitamin X52  Result Value Ref Range   Vitamin B-12 1,310 (H) 211 - 911 pg/mL  VITAMIN D 25 Hydroxy (Vit-D Deficiency, Fractures)  Result Value Ref Range   VITD 32.05 30.00 - 100.00 ng/mL  T4, free  Result Value Ref Range   Free T4 1.14 0.60 - 1.60 ng/dL  T3, free  Result Value Ref Range   T3, Free 3.0 2.3 - 4.2 pg/mL  TSH  Result Value Ref Range   TSH 4.00 0.35 - 5.50 uIU/mL   No results found.  Assessment and Plan:     ICD-10-CM   1. Healthcare maintenance  Z00.00       Health Maintenance Exam: The patient's preventative maintenance and recommended screening tests for an annual wellness exam were reviewed in full today. Brought up to date unless services declined.  Counselled on the importance of diet, exercise, and its role in overall health and mortality. The patient's FH and SH was reviewed, including their home life, tobacco status, and drug and alcohol status.  Follow-up in 1 year for physical exam or additional follow-up below.  Disposition: No follow-ups on file.  Future Appointments  Date Time Provider Department Center  03/27/2023  9:40 AM Iveliz Garay, Karleen Hampshire, MD [facility]  02/27/2024  8:45 AM [facility] ANNUAL WELLNESS VISIT 1 [facility]    No orders of the defined types were placed in this encounter.  There are no discontinued medications. No orders of the defined types were placed in this encounter.   Signed,  Elpidio Galea. Bryella Diviney, MD   Allergies as of 03/27/2023       Reactions   Sulfonamide Derivatives    Ace Inhibitors Cough  Medication List        Accurate as of March 26, 2023 12:33 PM. If you have any questions, ask your nurse or doctor.          albuterol 108 (90 Base) MCG/ACT inhaler Commonly known as: VENTOLIN HFA Inhale 2 puffs into the lungs every 4 (four) hours as needed for wheezing.   amLODipine 10 MG tablet Commonly known as: NORVASC TAKE ONE TABLET BY MOUTH ONCE A DAY    atorvastatin 20 MG tablet Commonly known as: LIPITOR TAKE 1 TABLET BY MOUTH ONCE DAILY   clindamycin 1 % lotion Commonly known as: CLEOCIN T Apply topically.   clonazePAM 0.5 MG tablet Commonly known as: KLONOPIN Take 1 tablet (0.5 mg total) by mouth 2 (two) times daily as needed for anxiety.   FLUoxetine 20 MG capsule Commonly known as: PROZAC TAKE ONE CAPSULE BY MOUTH ONCE DAILY   levothyroxine 50 MCG tablet Commonly known as: SYNTHROID TAKE ONE TAB BY MOUTH ONCE DAILY. TAKE ON AN EMPTY STOMACH WITH A GLASS OF WATER ATLEAST 30-60 MINUTES BEFORE BREAKFAST   losartan-hydrochlorothiazide 100-25 MG tablet Commonly known as: HYZAAR TAKE 1 TABLET BY MOUTH ONCE DAILY   metFORMIN 500 MG 24 hr tablet Commonly known as: GLUCOPHAGE-XR Take 500 mg by mouth daily with breakfast.   ONE TOUCH ULTRA TEST test strip Generic drug: glucose blood USE TO CHECK BLOOD SUGAR TWICE A DAY AND AS DIRECTED   OneTouch Delica Lancets 33G Misc Use to check blood sugar two times a day   Vitamin D 50 MCG (2000 UT) Caps Take 2,000 Units by mouth daily at 12 noon.

## 2023-03-27 ENCOUNTER — Ambulatory Visit (INDEPENDENT_AMBULATORY_CARE_PROVIDER_SITE_OTHER): Payer: Medicare Other | Admitting: Family Medicine

## 2023-03-27 ENCOUNTER — Encounter: Payer: Self-pay | Admitting: Family Medicine

## 2023-03-27 VITALS — BP 126/68 | HR 65 | Temp 97.1°F | Ht 59.5 in | Wt 163.2 lb

## 2023-03-27 DIAGNOSIS — Z0000 Encounter for general adult medical examination without abnormal findings: Secondary | ICD-10-CM

## 2023-03-27 DIAGNOSIS — Z1211 Encounter for screening for malignant neoplasm of colon: Secondary | ICD-10-CM

## 2023-03-27 MED ORDER — CLONAZEPAM 0.5 MG PO TABS: 0.5000 mg | ORAL_TABLET | Freq: Two times a day (BID) | ORAL | 1 refills | Status: DC | PRN

## 2023-04-06 DIAGNOSIS — L570 Actinic keratosis: Secondary | ICD-10-CM | POA: Diagnosis not present

## 2023-04-06 DIAGNOSIS — D1801 Hemangioma of skin and subcutaneous tissue: Secondary | ICD-10-CM | POA: Diagnosis not present

## 2023-04-06 DIAGNOSIS — L821 Other seborrheic keratosis: Secondary | ICD-10-CM | POA: Diagnosis not present

## 2023-04-06 DIAGNOSIS — L578 Other skin changes due to chronic exposure to nonionizing radiation: Secondary | ICD-10-CM | POA: Diagnosis not present

## 2023-04-06 DIAGNOSIS — Z85828 Personal history of other malignant neoplasm of skin: Secondary | ICD-10-CM | POA: Diagnosis not present

## 2023-04-06 DIAGNOSIS — L7211 Pilar cyst: Secondary | ICD-10-CM | POA: Diagnosis not present

## 2023-04-06 DIAGNOSIS — D692 Other nonthrombocytopenic purpura: Secondary | ICD-10-CM | POA: Diagnosis not present

## 2023-04-06 DIAGNOSIS — L918 Other hypertrophic disorders of the skin: Secondary | ICD-10-CM | POA: Diagnosis not present

## 2023-04-06 DIAGNOSIS — D3612 Benign neoplasm of peripheral nerves and autonomic nervous system, upper limb, including shoulder: Secondary | ICD-10-CM | POA: Diagnosis not present

## 2023-04-21 ENCOUNTER — Other Ambulatory Visit: Payer: Self-pay | Admitting: Family Medicine

## 2023-04-21 NOTE — Telephone Encounter (Signed)
Last office visit 03/27/23 for CPE.  Last refilled 03/27/23 for #30 with 1 refill.  Next Appt: No future appointments.

## 2023-05-02 ENCOUNTER — Other Ambulatory Visit: Payer: Self-pay | Admitting: Family Medicine

## 2023-06-05 IMAGING — MG MM DIGITAL SCREENING BILAT W/ TOMO AND CAD
8 series · 8 of 24 positions shown · non-contrast
Comparison: Previous exam(s).

CLINICAL DATA: Screening.

EXAM:
DIGITAL SCREENING BILATERAL MAMMOGRAM WITH TOMOSYNTHESIS AND CAD
TECHNIQUE: Bilateral screening digital craniocaudal and mediolateral oblique
mammograms were obtained. Bilateral screening digital breast
tomosynthesis was performed. The images were evaluated with
computer-aided detection.

[R CC synth-2D]
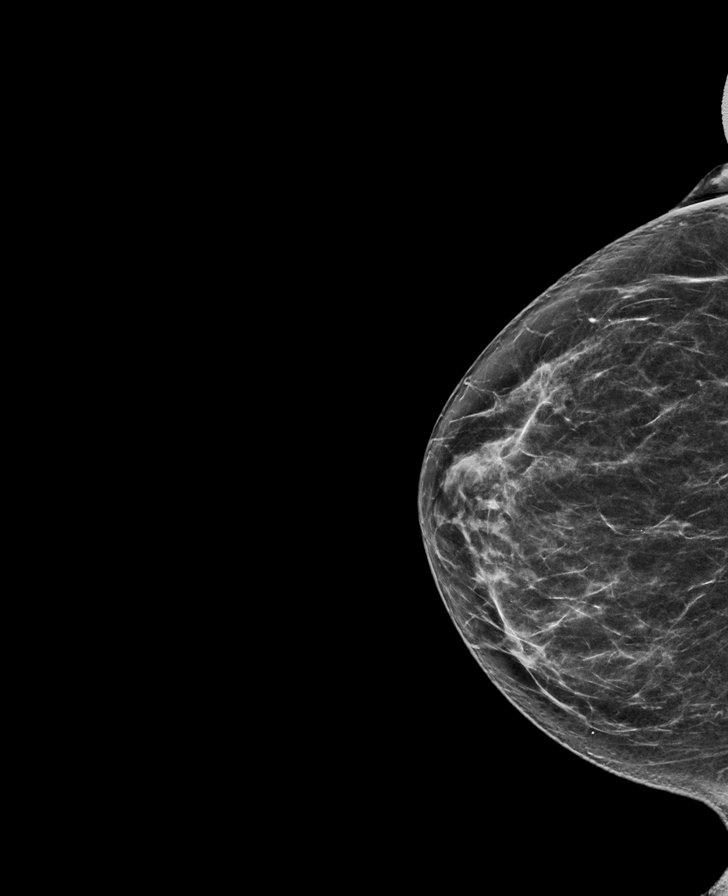

[R MLO synth-2D]
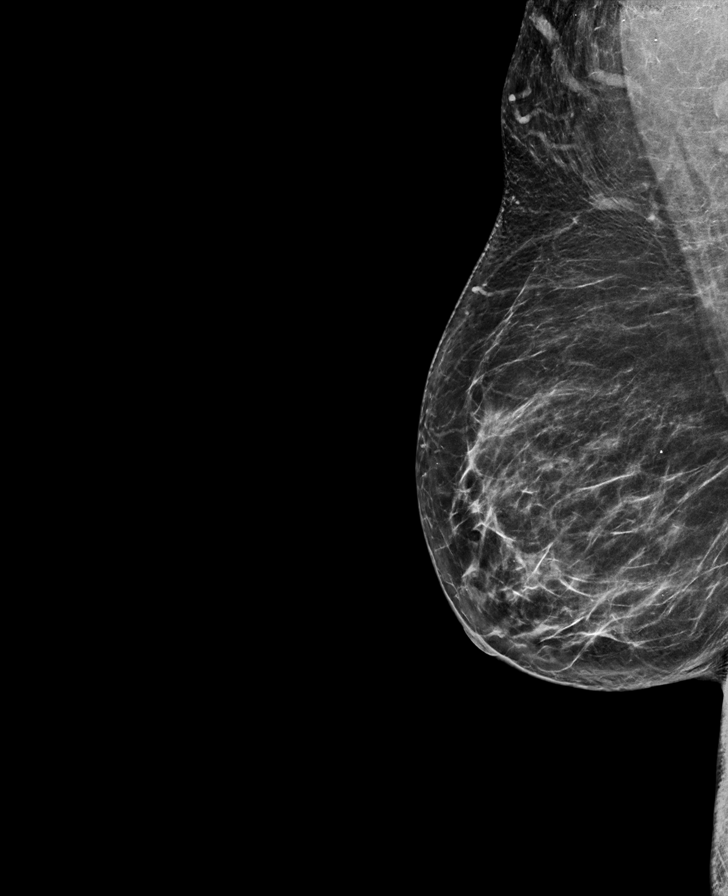

[L MLO synth-2D]
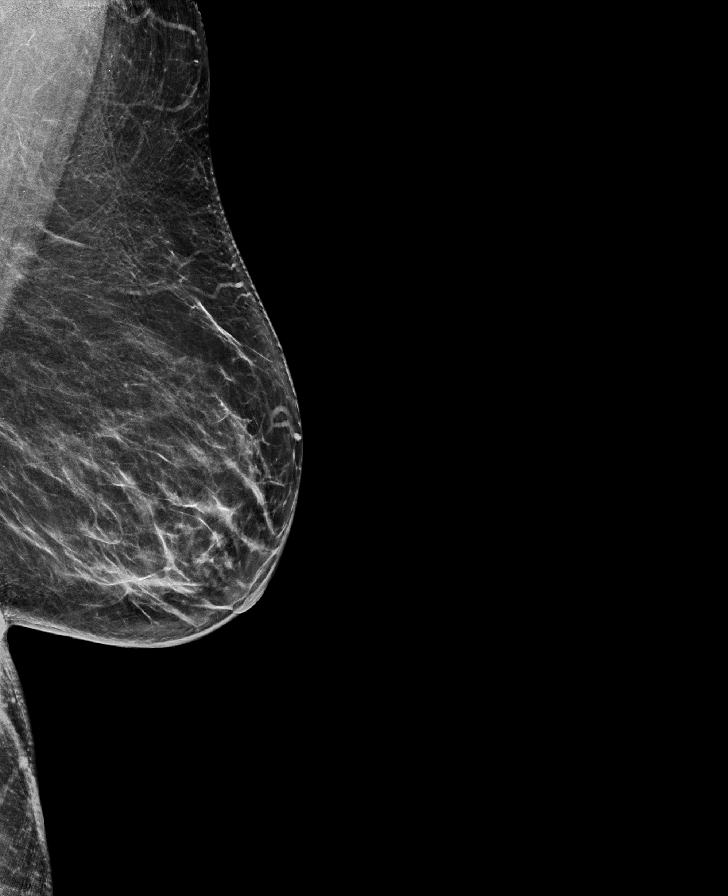

[L CC synth-2D]
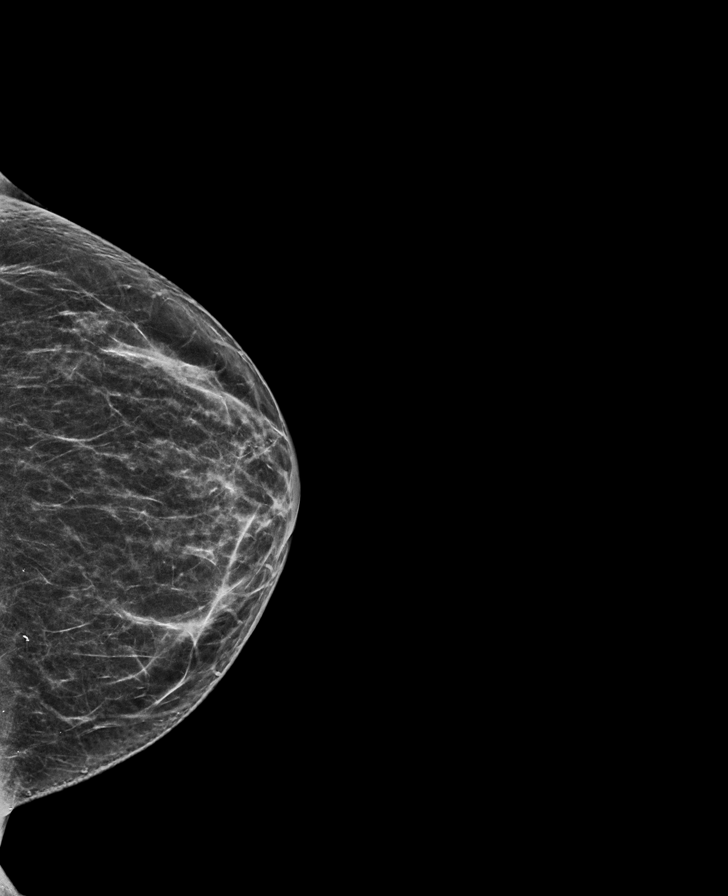

[L MLO tomo · tomo slice 39/77.0]
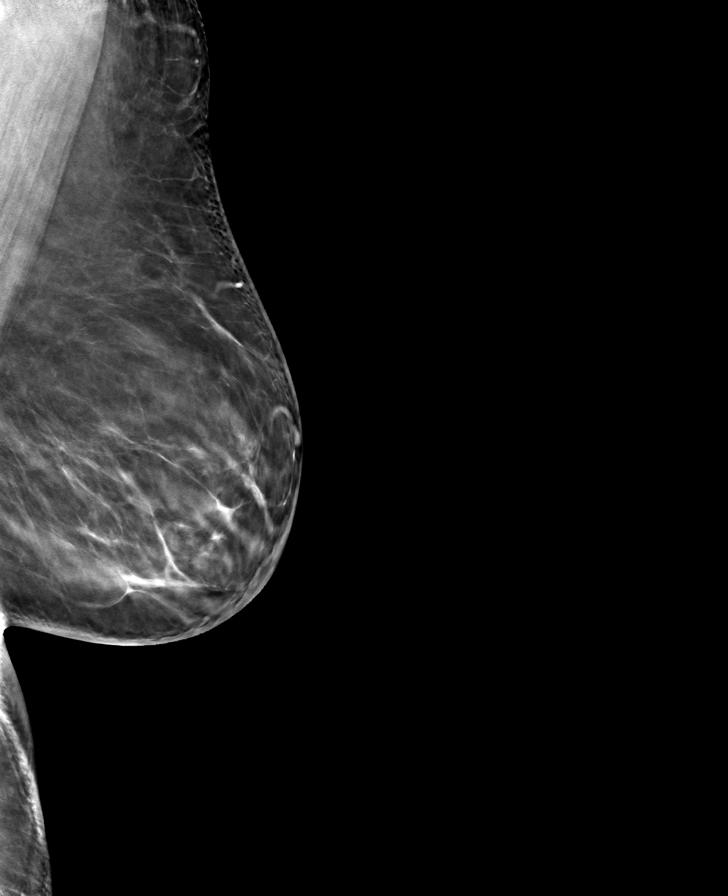

[L CC tomo · tomo slice 33/65.0]
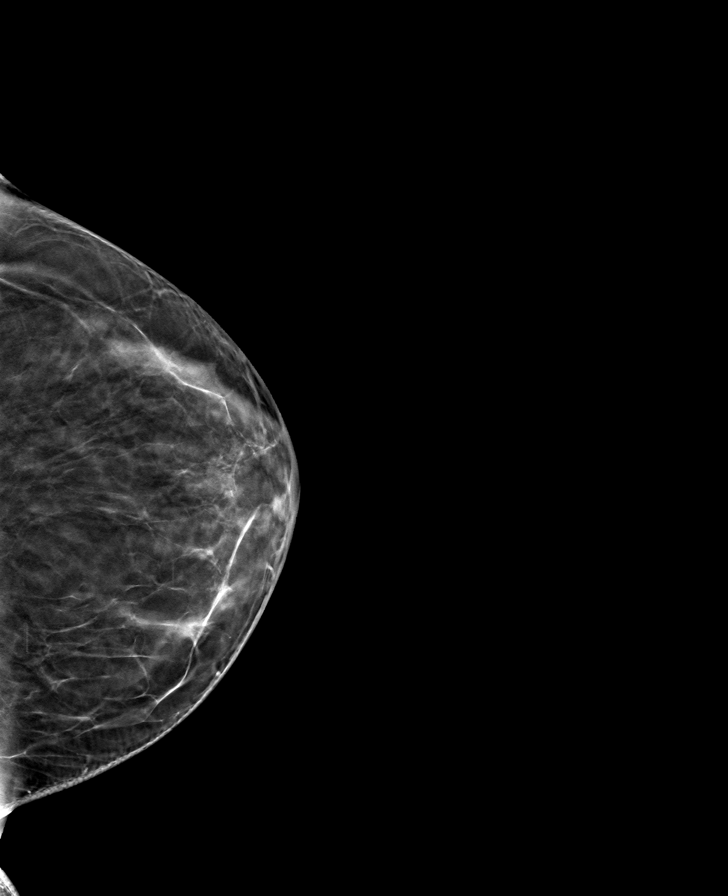

[R CC tomo · tomo slice 37/72.0]
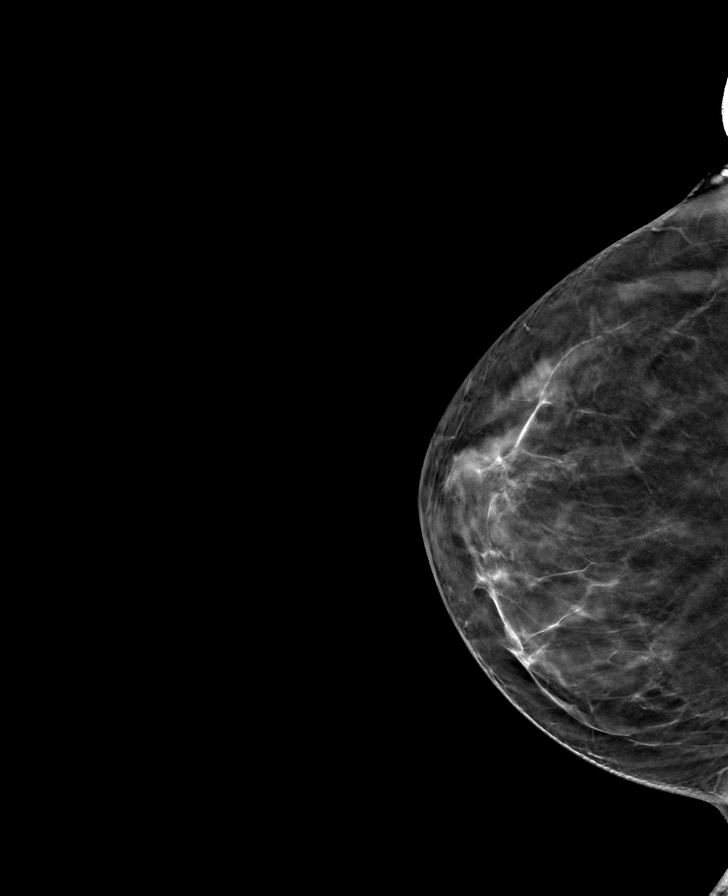

[R MLO tomo · tomo slice 41/80.0]
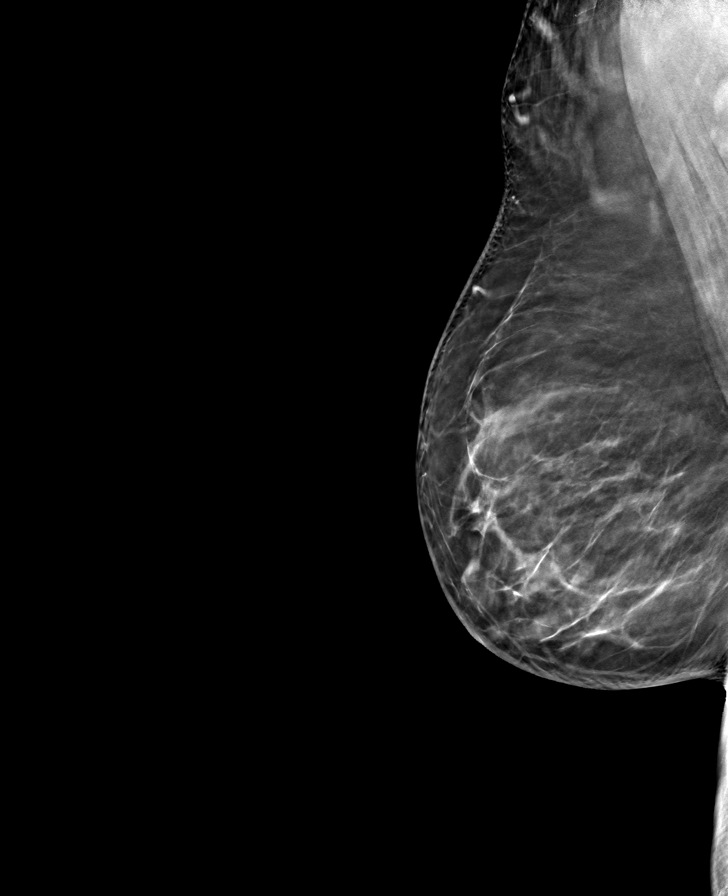

[8 of 24 positions shown; findings below may reference images not displayed]

ACR Breast Density Category b: There are scattered areas of
fibroglandular density.
FINDINGS: There are no findings suspicious for malignancy.
IMPRESSION: No mammographic evidence of malignancy. A result letter of this
screening mammogram will be mailed directly to the patient.

RECOMMENDATION:
Screening mammogram in one year. (Code:51-O-LD2)

BI-RADS CATEGORY  1: Negative.

## 2023-06-13 ENCOUNTER — Other Ambulatory Visit: Payer: Self-pay | Admitting: Family Medicine

## 2023-06-29 ENCOUNTER — Other Ambulatory Visit: Payer: Self-pay | Admitting: Family Medicine

## 2023-07-07 ENCOUNTER — Other Ambulatory Visit: Payer: Self-pay | Admitting: Family Medicine

## 2023-08-10 ENCOUNTER — Telehealth: Payer: Self-pay | Admitting: Family Medicine

## 2023-08-10 NOTE — Telephone Encounter (Signed)
Immunization record updated.

## 2023-08-10 NOTE — Telephone Encounter (Signed)
Patient called in and stated that she got her flu shot and Covid shot on August 08, 2023 at [facility].

## 2023-08-11 ENCOUNTER — Other Ambulatory Visit: Payer: Self-pay | Admitting: Family Medicine

## 2023-08-16 ENCOUNTER — Encounter: Payer: Self-pay | Admitting: Family Medicine

## 2023-08-16 ENCOUNTER — Ambulatory Visit (INDEPENDENT_AMBULATORY_CARE_PROVIDER_SITE_OTHER): Payer: Medicare Other | Admitting: Family Medicine

## 2023-08-16 VITALS — BP 120/60 | HR 103 | Temp 98.3°F | Ht 59.5 in | Wt 166.5 lb

## 2023-08-16 DIAGNOSIS — M1712 Unilateral primary osteoarthritis, left knee: Secondary | ICD-10-CM | POA: Diagnosis not present

## 2023-08-16 MED ORDER — TRIAMCINOLONE ACETONIDE 40 MG/ML IJ SUSP
40.0000 mg | Freq: Once | INTRAMUSCULAR | Status: AC
Start: 2023-08-16 — End: 2023-08-16
  Administered 2023-08-16: 40 mg via INTRA_ARTICULAR

## 2023-08-16 NOTE — Progress Notes (Signed)
Amali Uhls T. Alberto Schoch, MD, CAQ Sports Medicine [facility] at [facility] [address] [place], [address]  Phone: [phone]  FAX: [phone]  Robin Bass - 72 y.o. female  MRN [id number]  Date of Birth: 21-Nov-1950  Date: 08/16/2023  PCP: Hannah Beat, MD  Referral: Hannah Beat, MD  Chief Complaint  Patient presents with   Knee Pain    Left    F/u L knee pain  Aspiration/Injection Procedure Note [name] 1951/10/02 Date of procedure: 08/16/2023  Procedure: Large Joint Aspiration / Injection of Knee,L Indications: Pain  Procedure Details Patient verbally consented to procedure. Risks, benefits, and alternatives explained. Sterilely prepped with Chloraprep. Ethyl cholride used for anesthesia. 9 cc Lidocaine 1% mixed with 1 mL of Kenalog 40 mg injected using the anteromedial approach without difficulty. No complications with procedure and tolerated well. Patient had decreased pain post-injection. Medication: 1 mL of Kenalog 40 mg     ICD-10-CM   1. Primary osteoarthritis of left knee  M17.12 triamcinolone acetonide (KENALOG-40) injection 40 mg      Medication Management during today's office visit: Meds ordered this encounter  Medications   triamcinolone acetonide (KENALOG-40) injection 40 mg   Signed,  Riannah Stagner T. Dominic Mahaney, MD   Outpatient Encounter Medications as of 08/16/2023  Medication Sig   albuterol (VENTOLIN HFA) 108 (90 Base) MCG/ACT inhaler Inhale 2 puffs into the lungs every 4 (four) hours as needed for wheezing.   amLODipine (NORVASC) 10 MG tablet TAKE ONE TABLET BY MOUTH ONCE A DAY   atorvastatin (LIPITOR) 20 MG tablet TAKE ONE TABLET BY MOUTH ONCE DAILY   Cholecalciferol (VITAMIN D) 50 MCG (2000 UT) CAPS Take 2,000 Units by mouth daily at 12 noon.   clindamycin (CLEOCIN T) 1 % lotion Apply topically.   clonazePAM (KLONOPIN) 0.5 MG tablet TAKE ONE TABLET (0.5 MG TOTAL) BY MOUTH TWO TIMES  DAILY AS NEEDED FOR ANXIETY.   FLUoxetine (PROZAC) 20 MG capsule TAKE ONE CAPSULE BY MOUTH ONCE DAILY   levothyroxine (SYNTHROID) 50 MCG tablet TAKE ONE TAB BY MOUTH ONCE DAILY. TAKE ON AN EMPTY STOMACH WITH A GLASS OF WATER ATLEAST 30-60 MINUTES BEFORE BREAKFAST   losartan-hydrochlorothiazide (HYZAAR) 100-25 MG tablet TAKE ONE TABLET BY MOUTH ONCE DAILY   metFORMIN (GLUCOPHAGE-XR) 500 MG 24 hr tablet Take 1 tablet (500 mg total) by mouth daily with breakfast.   ONE TOUCH ULTRA TEST test strip USE TO CHECK BLOOD SUGAR TWICE A DAY AND AS DIRECTED   ONETOUCH DELICA LANCETS 33G MISC Use to check blood sugar two times a day   [EXPIRED] triamcinolone acetonide (KENALOG-40) injection 40 mg    No facility-administered encounter medications on file as of 08/16/2023.

## 2023-10-09 DIAGNOSIS — L578 Other skin changes due to chronic exposure to nonionizing radiation: Secondary | ICD-10-CM | POA: Diagnosis not present

## 2023-10-09 DIAGNOSIS — D1801 Hemangioma of skin and subcutaneous tissue: Secondary | ICD-10-CM | POA: Diagnosis not present

## 2023-10-09 DIAGNOSIS — L738 Other specified follicular disorders: Secondary | ICD-10-CM | POA: Diagnosis not present

## 2023-10-09 DIAGNOSIS — Z85828 Personal history of other malignant neoplasm of skin: Secondary | ICD-10-CM | POA: Diagnosis not present

## 2023-10-23 ENCOUNTER — Other Ambulatory Visit: Payer: Self-pay | Admitting: Family Medicine

## 2023-10-23 NOTE — Telephone Encounter (Signed)
Please call and schedule office visit to follow up on diabetes with Dr. Lorelei Pont.

## 2023-10-23 NOTE — Telephone Encounter (Signed)
 Spoke to pt, scheduled f/u on 10/26/23

## 2023-10-25 NOTE — Progress Notes (Signed)
 Tevis Conger T. Even Budlong, MD, CAQ Sports Medicine [facility] at [facility] [address] [place], [address]  Phone: [phone]  FAX: [phone]  Robin Bass - 73 y.o. female  MRN [id number]  Date of Birth: 11-15-1950  Date: 10/26/2023  PCP: Watt Mirza, MD  Referral: Watt Mirza, MD  Chief Complaint  Patient presents with   Diabetes   Subjective:   Robin Bass is a 73 y.o. very pleasant female patient with Body mass index is 33.02 kg/m. who presents with the following:  The patient presents for ongoing diabetes follow-up.  She is currently taking metformin  XR 500 mg each morning.  Diabetes Mellitus: Tolerating Medications: yes Compliance with diet: fair, Body mass index is 33.02 kg/m. Exercise: minimal / intermittent Avg blood sugars at home: not checking Foot problems: none Hypoglycemia: none No nausea, vomitting, blurred vision, polyuria.  Lab Results  Component Value Date   HGBA1C 7.1 (A) 10/26/2023   HGBA1C 7.1 (H) 03/16/2023   HGBA1C 7.1 (A) 01/02/2023   Lab Results  Component Value Date   MICROALBUR <0.7 03/16/2023   LDLCALC 66 03/16/2023   CREATININE 0.92 03/16/2023    Wt Readings from Last 3 Encounters:  10/26/23 166 lb 4 oz (75.4 kg)  08/16/23 166 lb 8 oz (75.5 kg)  03/27/23 163 lb 4 oz (74 kg)    She is having a lot of stress at home.  She is having a lot of stress with her husband.  Review of Systems is noted in the HPI, as appropriate  Objective:   BP 114/60 (BP Location: Left Arm, Patient Position: Sitting, Cuff Size: Large)   Pulse 85   Temp 98.2 F (36.8 C) (Temporal)   Ht 4' 11.5 (1.511 m)   Wt 166 lb 4 oz (75.4 kg)   SpO2 97%   BMI 33.02 kg/m   GEN: No acute distress; alert,appropriate. PULM: Breathing comfortably in no respiratory distress PSYCH: Normally interactive.  CV: RRR, no m/g/r   Laboratory and Imaging Data: Results for orders placed or performed in  visit on 10/26/23  POCT glycosylated hemoglobin (Hb A1C)   Collection Time: 10/26/23 12:13 PM  Result Value Ref Range   Hemoglobin A1C 7.1 (A) 4.0 - 5.6 %   HbA1c POC (<> result, manual entry)     HbA1c, POC (prediabetic range)     HbA1c, POC (controlled diabetic range)       Assessment and Plan:     ICD-10-CM   1. Controlled type 2 diabetes mellitus without complication, without long-term current use of insulin  (HCC)  E11.9 POCT glycosylated hemoglobin (Hb A1C)    2. Essential hypertension  I10      Diabetes and blood pressure under good control.  She is going to work on cutting back on her desserts and sweets, do suspect that her A1c will normalize even more.  Orders placed today for conditions managed today: Orders Placed This Encounter  Procedures   POCT glycosylated hemoglobin (Hb A1C)    Disposition: Return in about 6 months (around 04/24/2024) for Dr. Watt, Medicare Wellness.  Dr[employer] One speech-to-text software was used for transcription in this dictation.  Possible transcriptional errors can occur using Animal nutritionist.   Signed,  Mirza DASEN. Kyra Laffey, MD   Outpatient Encounter Medications as of 10/26/2023  Medication Sig   albuterol  (VENTOLIN  HFA) 108 (90 Base) MCG/ACT inhaler Inhale 2 puffs into the lungs every 4 (four) hours as needed for wheezing.   amLODipine  (NORVASC ) 10  MG tablet TAKE ONE TABLET BY MOUTH ONCE A DAY   atorvastatin  (LIPITOR) 20 MG tablet TAKE ONE TABLET BY MOUTH ONCE DAILY   Cholecalciferol (VITAMIN D ) 50 MCG (2000 UT) CAPS Take 2,000 Units by mouth daily at 12 noon.   clindamycin (CLEOCIN T) 1 % lotion Apply topically.   clonazePAM  (KLONOPIN ) 0.5 MG tablet TAKE ONE TABLET (0.5 MG TOTAL) BY MOUTH TWO TIMES DAILY AS NEEDED FOR ANXIETY.   FLUoxetine  (PROZAC ) 20 MG capsule TAKE ONE CAPSULE BY MOUTH ONCE DAILY   levothyroxine  (SYNTHROID ) 50 MCG tablet TAKE ONE TAB BY MOUTH ONCE DAILY. TAKE ON AN EMPTY STOMACH WITH A GLASS OF WATER ATLEAST  30-60 MINUTES BEFORE BREAKFAST   losartan -hydrochlorothiazide  (HYZAAR) 100-25 MG tablet TAKE ONE TABLET BY MOUTH ONCE DAILY   metFORMIN  (GLUCOPHAGE -XR) 500 MG 24 hr tablet TAKE ONE TABLET (500 MG TOTAL) BY MOUTH DAILY WITH BREAKFAST.   ONE TOUCH ULTRA TEST test strip USE TO CHECK BLOOD SUGAR TWICE A DAY AND AS DIRECTED   ONETOUCH DELICA LANCETS 33G MISC Use to check blood sugar two times a day   No facility-administered encounter medications on file as of 10/26/2023.

## 2023-10-26 ENCOUNTER — Encounter: Payer: Self-pay | Admitting: Family Medicine

## 2023-10-26 ENCOUNTER — Ambulatory Visit: Payer: Medicare Other | Admitting: Family Medicine

## 2023-10-26 VITALS — BP 114/60 | HR 85 | Temp 98.2°F | Ht 59.5 in | Wt 166.2 lb

## 2023-10-26 DIAGNOSIS — I10 Essential (primary) hypertension: Secondary | ICD-10-CM | POA: Diagnosis not present

## 2023-10-26 DIAGNOSIS — Z7984 Long term (current) use of oral hypoglycemic drugs: Secondary | ICD-10-CM

## 2023-10-26 DIAGNOSIS — E119 Type 2 diabetes mellitus without complications: Secondary | ICD-10-CM | POA: Diagnosis not present

## 2023-10-26 LAB — POCT GLYCOSYLATED HEMOGLOBIN (HGB A1C): Hemoglobin A1C: 7.1 % — AB (ref 4.0–5.6)

## 2023-10-31 ENCOUNTER — Ambulatory Visit (INDEPENDENT_AMBULATORY_CARE_PROVIDER_SITE_OTHER): Payer: Medicare Other | Admitting: Primary Care

## 2023-10-31 ENCOUNTER — Ambulatory Visit (INDEPENDENT_AMBULATORY_CARE_PROVIDER_SITE_OTHER)
Admission: RE | Admit: 2023-10-31 | Discharge: 2023-10-31 | Disposition: A | Discharge status: Home / Self Care | Payer: Medicare Other | Source: Ambulatory Visit | Attending: Primary Care | Admitting: Primary Care

## 2023-10-31 ENCOUNTER — Encounter: Payer: Self-pay | Admitting: Primary Care

## 2023-10-31 VITALS — BP 128/62 | HR 90 | Temp 97.4°F | Ht 59.5 in | Wt 165.0 lb

## 2023-10-31 DIAGNOSIS — R051 Acute cough: Secondary | ICD-10-CM

## 2023-10-31 LAB — POC COVID19 BINAXNOW: SARS Coronavirus 2 Ag: NEGATIVE

## 2023-10-31 NOTE — Patient Instructions (Signed)
 Complete xray(s) prior to leaving today. I will notify you of your results once received.  I will be in touch later today regarding results.  It was a pleasure to see you today!

## 2023-10-31 NOTE — Assessment & Plan Note (Addendum)
 Symptoms and presentation today representative of viral etiology. Although, pulse oxygen saturation is 95% which is slightly below her normal.  Respiratory exam today overall benign. Negative COVID-19 test in the office today.  Given her exposure to pneumonia, coupled with her symptoms, we will check with a chest x-ray today.  She agrees.  Continue conservative treatment for now. Await chest x-ray results.

## 2023-10-31 NOTE — Progress Notes (Signed)
 Subjective:    Patient ID: Robin Bass, female    DOB: December 16, 1950, 73 y.o.   MRN: [id number]  Cough Associated symptoms include headaches and a sore throat. Pertinent negatives include no chills, ear pain, fever, postnasal drip, shortness of breath or wheezing.    Robin Bass is a very pleasant 73 y.o. female patient of Dr. Watt with a history of COPD, asthma, type 2 diabetes, hypertension who presents today to discuss cough.  Symptom onset 2 days ago with nasal congestion and a dry cough.  She then developed chest congestion. She has been around her husband who is currently hospitalized for community-acquired pneumonia. His symptoms began about 10 days ago.   She denies fevers, chills, body aches, shortness of breath, wheezing, chest tightness. She's been taking Excedrin Migraine for headaches and cough syrup with codeine  from an older prescription which helps. She is a non smoker.   Review of Systems  Constitutional:  Positive for fatigue. Negative for chills and fever.  HENT:  Positive for congestion and sore throat. Negative for ear pain and postnasal drip.   Respiratory:  Positive for cough. Negative for shortness of breath and wheezing.   Neurological:  Positive for headaches.         Past Medical History:  Diagnosis Date   Asthma    Basal cell carcinoma    skin   COPD (chronic obstructive pulmonary disease) (HCC)    Diabetes mellitus type 2, controlled, without complications (HCC) 08/21/2014   GERD (gastroesophageal reflux disease)    History of pituitary tumor    Hypothyroidism    Overactive bladder     Social History   Socioeconomic History   Marital status: Married    Spouse name: Not on file   Number of children: 1   Years of education: Not on file   Highest education level: Not on file  Occupational History   Occupation: Producer, Television/film/video: UNEMPLOYED  Tobacco Use   Smoking status: Never   Smokeless tobacco: Never   Vaping Use   Vaping status: Never Used  Substance and Sexual Activity   Alcohol use: No   Drug use: No   Sexual activity: Not on file  Other Topics Concern   Not on file  Social History Narrative   Not on file   Social Drivers of Health   Financial Resource Strain: Low Risk  (02/23/2023)   Overall Financial Resource Strain (CARDIA)    Difficulty of Paying Living Expenses: Not hard at all  Food Insecurity: No Food Insecurity (02/23/2023)   Hunger Vital Sign    Worried About Running Out of Food in the Last Year: Never true    Ran Out of Food in the Last Year: Never true  Transportation Needs: No Transportation Needs (02/23/2023)   PRAPARE - Administrator, Civil Service (Medical): No    Lack of Transportation (Non-Medical): No  Physical Activity: Inactive (02/23/2023)   Exercise Vital Sign    Days of Exercise per Week: 0 days    Minutes of Exercise per Session: 0 min  Stress: No Stress Concern Present (02/23/2023)   [employer] of Occupational Health - Occupational Stress Questionnaire    Feeling of Stress : Only a little  Social Connections: Moderately Isolated (02/23/2023)   Social Connection and Isolation Panel [NHANES]    Frequency of Communication with Friends and Family: More than three times a week    Frequency of Social Gatherings with Friends and Family: More  than three times a week    Attends Religious Services: Never    Active Member of Clubs or Organizations: No    Attends Banker Meetings: Never    Marital Status: Married  Catering Manager Violence: Not At Risk (02/23/2023)   Humiliation, Afraid, Rape, and Kick questionnaire    Fear of Current or Ex-Partner: No    Emotionally Abused: No    Physically Abused: No    Sexually Abused: No    Past Surgical History:  Procedure Laterality Date   ABDOMINAL HYSTERECTOMY      Family History  Problem Relation Age of Onset   Hypertension Mother    Heart attack Father    Breast cancer Neg Hx      Allergies  Allergen Reactions   Sulfonamide Derivatives    Ace Inhibitors Cough    Current Outpatient Medications on File Prior to Visit  Medication Sig Dispense Refill   albuterol  (VENTOLIN  HFA) 108 (90 Base) MCG/ACT inhaler Inhale 2 puffs into the lungs every 4 (four) hours as needed for wheezing. 1 each 0   amLODipine  (NORVASC ) 10 MG tablet TAKE ONE TABLET BY MOUTH ONCE A DAY 90 tablet 1   atorvastatin  (LIPITOR) 20 MG tablet TAKE ONE TABLET BY MOUTH ONCE DAILY 100 tablet 2   Cholecalciferol (VITAMIN D ) 50 MCG (2000 UT) CAPS Take 2,000 Units by mouth daily at 12 noon.     clindamycin (CLEOCIN T) 1 % lotion Apply topically.     clonazePAM  (KLONOPIN ) 0.5 MG tablet TAKE ONE TABLET (0.5 MG TOTAL) BY MOUTH TWO TIMES DAILY AS NEEDED FOR ANXIETY. 30 tablet 1   FLUoxetine  (PROZAC ) 20 MG capsule TAKE ONE CAPSULE BY MOUTH ONCE DAILY 90 capsule 1   levothyroxine  (SYNTHROID ) 50 MCG tablet TAKE ONE TAB BY MOUTH ONCE DAILY. TAKE ON AN EMPTY STOMACH WITH A GLASS OF WATER ATLEAST 30-60 MINUTES BEFORE BREAKFAST 90 tablet 2   losartan -hydrochlorothiazide  (HYZAAR) 100-25 MG tablet TAKE ONE TABLET BY MOUTH ONCE DAILY 90 tablet 1   metFORMIN  (GLUCOPHAGE -XR) 500 MG 24 hr tablet TAKE ONE TABLET (500 MG TOTAL) BY MOUTH DAILY WITH BREAKFAST. 90 tablet 0   ONE TOUCH ULTRA TEST test strip USE TO CHECK BLOOD SUGAR TWICE A DAY AND AS DIRECTED 100 each 6   ONETOUCH DELICA LANCETS 33G MISC Use to check blood sugar two times a day 100 each 11   No current facility-administered medications on file prior to visit.    BP 128/62   Pulse 90   Temp (!) 97.4 F (36.3 C) (Temporal)   Ht 4' 11.5 (1.511 m)   Wt 165 lb (74.8 kg)   SpO2 95%   BMI 32.77 kg/m  Objective:   Physical Exam Constitutional:      Appearance: She is not ill-appearing.  HENT:     Right Ear: Tympanic membrane and ear canal normal.     Left Ear: Tympanic membrane and ear canal normal.     Nose: No mucosal edema.     Right Sinus: No  maxillary sinus tenderness or frontal sinus tenderness.     Left Sinus: No maxillary sinus tenderness or frontal sinus tenderness.     Mouth/Throat:     Mouth: Mucous membranes are moist.  Eyes:     Conjunctiva/sclera: Conjunctivae normal.  Cardiovascular:     Rate and Rhythm: Normal rate and regular rhythm.  Pulmonary:     Effort: Pulmonary effort is normal.     Breath sounds: Normal breath sounds. No wheezing  or rhonchi.     Comments: Congested cough noted during visit Musculoskeletal:     Cervical back: Neck supple.  Skin:    General: Skin is warm and dry.           Assessment & Plan:  Acute cough Assessment & Plan: Symptoms and presentation today representative of viral etiology. Although, pulse oxygen saturation is 95% which is slightly below her normal.  Respiratory exam today overall benign.  Given her exposure to pneumonia, coupled with her symptoms, we will check with a chest x-ray today.  She agrees.  Continue conservative treatment for now. Await chest x-ray results.  Orders: -     POC COVID-19 BinaxNow -     DG Chest 2 View        Comer MARLA Gaskins, NP

## 2023-11-02 ENCOUNTER — Telehealth: Payer: Self-pay

## 2023-11-02 DIAGNOSIS — R051 Acute cough: Secondary | ICD-10-CM

## 2023-11-02 NOTE — Telephone Encounter (Signed)
 Copied from CRM [id number]. Topic: Clinical - Medical Advice >> Nov 02, 2023  1:20 PM [name] wrote: Reason for CRM: PATIENT STATES SHE IS NO BETTER FROM HER VISIT THIS WEEK EITHER MONDAY OR TUESDAY. SHE WAS INSTRUCTED TO CALL IN ON FRIDAY 1/10 BUT DUE TO POSSIBLE BAD WEATHER CONDITIONS THE PATIENT WANTED TO CALL IN TODAY. PATIENT WANTS T KNOW IF SOMETHING CAN BE CALLED IN FOR HER. HEAD CONGESTION COUGH AND SLEEPING. NO FEVER

## 2023-11-02 NOTE — Telephone Encounter (Signed)
 Patient was seen by Mayra Reel on 10/31/23

## 2023-11-03 MED ORDER — PREDNISONE 20 MG PO TABS
ORAL_TABLET | ORAL | 0 refills | Status: DC
Start: 2023-11-03 — End: 2023-11-13

## 2023-11-03 NOTE — Telephone Encounter (Signed)
 Noted.  Prednisone prescription sent to pharmacy.

## 2023-11-03 NOTE — Telephone Encounter (Signed)
 Called and spoke with patient she states she would like to try the prednisone. Please send to [facility]

## 2023-11-03 NOTE — Telephone Encounter (Signed)
 Please call patient:  Her symptoms could still be viral, no antibiotics warranted at this time. Chest xray was negative for pneumonia. We were told yesterday that she has no fevers.   We can try some prednisone  to help with head pressure. This is a steroid medication that is taken in the morning. This can help.  Does she want to try?

## 2023-11-03 NOTE — Addendum Note (Signed)
 Addended by: Doreene Nest on: 11/03/2023 09:53 AM   Modules accepted: Orders

## 2023-11-12 NOTE — Progress Notes (Unsigned)
   Fenix Ruppe T. Edessa Jakubowicz, MD, CAQ Sports Medicine [facility] at [facility] [address] [place], [address]  Phone: [phone]  FAX: [phone]  Kyiah Saca - 73 y.o. female  MRN [id number]  Date of Birth: June 26, 1951  Date: 11/13/2023  PCP: Hannah Beat, MD  Referral: Hannah Beat, MD  No chief complaint on file.  Subjective:   Robin Bass is a 73 y.o. very pleasant female patient with There is no height or weight on file to calculate BMI. who presents with the following:  Consuella Lose is here to follow-up with me about an ongoing respiratory illness.  She saw Dr. Chestine Spore last week and she felt like he had a acute viral respiratory illness.    Review of Systems is noted in the HPI, as appropriate  Objective:   There were no vitals taken for this visit.  GEN: No acute distress; alert,appropriate. PULM: Breathing comfortably in no respiratory distress PSYCH: Normally interactive.   Laboratory and Imaging Data:  Assessment and Plan:   ***

## 2023-11-13 ENCOUNTER — Encounter: Payer: Self-pay | Admitting: Family Medicine

## 2023-11-13 ENCOUNTER — Ambulatory Visit (INDEPENDENT_AMBULATORY_CARE_PROVIDER_SITE_OTHER): Payer: Medicare Other | Admitting: Family Medicine

## 2023-11-13 VITALS — BP 112/62 | HR 84 | Temp 98.3°F | Ht 59.5 in | Wt 162.1 lb

## 2023-11-13 DIAGNOSIS — R051 Acute cough: Secondary | ICD-10-CM

## 2023-11-13 DIAGNOSIS — R058 Other specified cough: Secondary | ICD-10-CM | POA: Diagnosis not present

## 2023-12-20 ENCOUNTER — Other Ambulatory Visit: Payer: Self-pay | Admitting: Family Medicine

## 2023-12-22 ENCOUNTER — Other Ambulatory Visit: Payer: Self-pay | Admitting: Family Medicine

## 2023-12-22 DIAGNOSIS — Z1231 Encounter for screening mammogram for malignant neoplasm of breast: Secondary | ICD-10-CM

## 2023-12-25 ENCOUNTER — Ambulatory Visit
Admission: EM | Admit: 2023-12-25 | Discharge: 2023-12-25 | Disposition: A | Discharge status: Home / Self Care | Attending: Emergency Medicine | Admitting: Emergency Medicine

## 2023-12-25 DIAGNOSIS — R300 Dysuria: Secondary | ICD-10-CM | POA: Diagnosis not present

## 2023-12-25 LAB — POCT URINALYSIS DIP (MANUAL ENTRY)
Bilirubin, UA: NEGATIVE
Glucose, UA: NEGATIVE mg/dL
Ketones, POC UA: NEGATIVE mg/dL
Nitrite, UA: NEGATIVE
Protein Ur, POC: NEGATIVE mg/dL
Spec Grav, UA: 1.015
Urobilinogen, UA: 0.2 U/dL
pH, UA: 7

## 2023-12-25 MED ORDER — CEPHALEXIN 500 MG PO CAPS: 500.0000 mg | ORAL_CAPSULE | Freq: Two times a day (BID) | ORAL | 0 refills | Status: AC

## 2023-12-25 NOTE — ED Provider Notes (Signed)
 [facility]    CSN: [id number] Arrival date & time: 12/25/23  1400      History   Chief Complaint Chief Complaint  Patient presents with   Dysuria   urinary frequency    HPI Robin Bass is a 73 y.o. female.  Patient presents with 7-day history of urinary frequency and bladder pressure.  She developed dysuria today.  No fever, flank pain, hematuria, abdominal pain.  The history is provided by the patient and medical records.    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      Home Medications    Prior to Admission medications   Medication Sig Start Date End Date Taking? Authorizing Provider  albuterol (VENTOLIN HFA) 108 (90 Base) MCG/ACT inhaler Inhale 2 puffs into the lungs every 4 (four) hours as needed for wheezing. 10/05/21  Yes Tower, Audrie Gallus, MD  amLODipine (NORVASC) 10 MG tablet TAKE ONE TABLET BY MOUTH ONCE A DAY 12/20/23  Yes Copland, Karleen Hampshire, MD  atorvastatin (LIPITOR) 20 MG tablet TAKE ONE TABLET BY MOUTH ONCE DAILY 06/14/23  Yes Copland, Karleen Hampshire, MD  cephALEXin (KEFLEX) 500 MG capsule Take 1 capsule (500 mg total) by mouth 2 (two) times daily for 5  days. 12/25/23 12/30/23 Yes Mickie Bail, NP  Cholecalciferol (VITAMIN D) 50 MCG (2000 UT) CAPS Take 2,000 Units by mouth daily at 12 noon.   Yes [provider]  clindamycin (CLEOCIN T) 1 % lotion Apply topically. 04/04/22  Yes [provider]  clonazePAM (KLONOPIN) 0.5 MG tablet TAKE ONE TABLET (0.5 MG TOTAL) BY MOUTH TWO TIMES DAILY AS NEEDED FOR ANXIETY. 04/21/23  Yes Copland, Karleen Hampshire, MD  FLUoxetine (PROZAC) 20 MG capsule TAKE ONE CAPSULE BY MOUTH ONCE DAILY 10/23/23  Yes Copland, Karleen Hampshire, MD  levothyroxine (SYNTHROID) 50 MCG tablet TAKE ONE TAB BY MOUTH ONCE DAILY. TAKE ON AN EMPTY STOMACH WITH A GLASS OF WATER ATLEAST 30-60 MINUTES BEFORE BREAKFAST 08/11/23  Yes Copland, Karleen Hampshire, MD  losartan-hydrochlorothiazide (HYZAAR) 100-25 MG tablet TAKE ONE TABLET BY MOUTH ONCE DAILY 12/20/23  Yes Copland, Karleen Hampshire, MD  metFORMIN (GLUCOPHAGE-XR) 500 MG 24 hr tablet TAKE ONE TABLET (500 MG TOTAL) BY MOUTH DAILY WITH BREAKFAST. 10/23/23  Yes Copland, Karleen Hampshire, MD  ONE TOUCH ULTRA TEST test strip USE TO CHECK BLOOD SUGAR TWICE A DAY AND AS DIRECTED 06/20/15  Yes Copland, Karleen Hampshire, MD  [facility] DELICA LANCETS 33G MISC Use to check blood sugar two times a day 06/04/14  Yes Copland, Karleen Hampshire, MD    Family History Family History  Problem Relation Age of Onset   Hypertension Mother    Heart attack Father  Breast cancer Neg Hx     Social History Social History   Tobacco Use   Smoking status: Never   Smokeless tobacco: Never  Vaping Use   Vaping status: Never Used  Substance Use Topics   Alcohol use: No   Drug use: No     Allergies   Sulfonamide derivatives and Ace inhibitors   Review of Systems Review of Systems  Constitutional:  Negative for chills and fever.  Gastrointestinal:  Negative for abdominal pain.  Genitourinary:  Positive for dysuria and frequency. Negative for flank pain and hematuria.     Physical Exam Triage Vital Signs ED Triage Vitals  Encounter Vitals  Group     BP 12/25/23 1459 125/70     Systolic BP Percentile --      Diastolic BP Percentile --      Pulse Rate 12/25/23 1459 95     Resp 12/25/23 1459 18     Temp 12/25/23 1459 97.8 F (36.6 C)     Temp src --      SpO2 12/25/23 1459 95 %     Weight --      Height --      Head Circumference --      Peak Flow --      Pain Score 12/25/23 1500 0     Pain Loc --      Pain Education --      Exclude from Growth Chart --    No data found.  Updated Vital Signs BP 125/70   Pulse 95   Temp 97.8 F (36.6 C)   Resp 18   SpO2 95%   Visual Acuity Right Eye Distance:   Left Eye Distance:   Bilateral Distance:    Right Eye Near:   Left Eye Near:    Bilateral Near:     Physical Exam Constitutional:      General: She is not in acute distress. HENT:     Mouth/Throat:     Mouth: Mucous membranes are moist.  Cardiovascular:     Rate and Rhythm: Normal rate and regular rhythm.     Heart sounds: Normal heart sounds.  Pulmonary:     Effort: Pulmonary effort is normal. No respiratory distress.     Breath sounds: Normal breath sounds.  Abdominal:     General: Bowel sounds are normal.     Palpations: Abdomen is soft.     Tenderness: There is no abdominal tenderness. There is no right CVA tenderness, left CVA tenderness, guarding or rebound.  Neurological:     Mental Status: She is alert.      UC Treatments / Results  Labs (all labs ordered are listed, but only abnormal results are displayed) Labs Reviewed  POCT URINALYSIS DIP (MANUAL ENTRY) - Abnormal; Notable for the following components:      Result Value   Clarity, UA cloudy (*)    Blood, UA trace-intact (*)    Leukocytes, UA Small (1+) (*)    All other components within normal limits  URINE CULTURE    EKG   Radiology No results found.  Procedures Procedures (including critical care time)  Medications Ordered in UC Medications - No data to display  Initial Impression / Assessment and Plan / UC Course  I  have reviewed the triage vital signs and the nursing notes.  Pertinent labs & imaging results that were available during my care of the patient were reviewed by me and considered in my medical decision making (see  chart for details).    Dysuria.  Treating with Keflex. Urine culture pending. Discussed with patient that we will call her if the urine culture shows the need to change or discontinue the antibiotic. Instructed her to follow-up with her PCP if her symptoms are not improving. Patient agrees to plan of care.     Final Clinical Impressions(s) / UC Diagnoses   Final diagnoses:  Dysuria     Discharge Instructions      Take the antibiotic as directed.  The urine culture is pending.  We will call you if it shows the need to change or discontinue your antibiotic.    Follow-up with your primary care provider if your symptoms are not improving.      ED Prescriptions     Medication Sig Dispense Auth. Provider   cephALEXin (KEFLEX) 500 MG capsule Take 1 capsule (500 mg total) by mouth 2 (two) times daily for 5 days. 10 capsule Mickie Bail, NP      PDMP not reviewed this encounter.   Mickie Bail, NP 12/25/23 [phone]

## 2023-12-25 NOTE — Discharge Instructions (Addendum)
 Take the antibiotic as directed.  The urine culture is pending.  We will call you if it shows the need to change or discontinue your antibiotic.    Follow up with your primary care provider if your symptoms are not improving.

## 2023-12-25 NOTE — ED Triage Notes (Signed)
 Pt reports pressure/frequency x 7 days then dysuria starting today.

## 2023-12-27 LAB — URINE CULTURE

## 2023-12-28 ENCOUNTER — Ambulatory Visit: Payer: Self-pay

## 2023-12-28 ENCOUNTER — Ambulatory Visit
Admission: EM | Admit: 2023-12-28 | Discharge: 2023-12-28 | Disposition: A | Discharge status: Home / Self Care | Attending: Emergency Medicine | Admitting: Emergency Medicine

## 2023-12-28 DIAGNOSIS — R300 Dysuria: Secondary | ICD-10-CM | POA: Insufficient documentation

## 2023-12-28 NOTE — ED Triage Notes (Signed)
 Patient returns for nurse visit for recollect as urine culture was rejected

## 2024-01-01 ENCOUNTER — Ambulatory Visit: Payer: Self-pay | Admitting: Family Medicine

## 2024-01-01 ENCOUNTER — Telehealth (HOSPITAL_COMMUNITY): Payer: Self-pay

## 2024-01-01 LAB — URINE CULTURE: Culture: 60000 — AB

## 2024-01-01 MED ORDER — NITROFURANTOIN MONOHYD MACRO 100 MG PO CAPS: 100.0000 mg | ORAL_CAPSULE | Freq: Two times a day (BID) | ORAL | 0 refills | Status: AC

## 2024-01-01 MED ORDER — NITROFURANTOIN MONOHYD MACRO 100 MG PO CAPS: 100.0000 mg | ORAL_CAPSULE | Freq: Two times a day (BID) | ORAL | 0 refills | Status: DC

## 2024-01-01 NOTE — Telephone Encounter (Signed)
 Reviewed with patient, verified pharmacy, prescription sent

## 2024-01-01 NOTE — Telephone Encounter (Signed)
 Pharmacy changed per pt request

## 2024-01-01 NOTE — Telephone Encounter (Signed)
 Recheck tomorrow is reasonable.  May need abx change.  Keflex not specifically on culture.

## 2024-01-01 NOTE — Telephone Encounter (Signed)
-----   Message from Valinda Hoar sent at 01/01/2024 12:27 PM EDT ----- Macrobid twice a day for 5 days ----- Message ----- From: Warren Danes, RN Sent: 01/01/2024  12:22 PM EDT To: Valinda Hoar, NP; #  Please advise regarding treatment for urine culture. Pt had originally gone home on Keflex. Had inconclusive culture. Was still symptomatic, so returned to recollection. Thanks

## 2024-01-01 NOTE — Addendum Note (Signed)
 Addended by: Warren Danes on: 01/01/2024 05:15 PM   Modules accepted: Orders

## 2024-01-01 NOTE — Telephone Encounter (Signed)
 Chief Complaint: Hematuria  Symptoms: blood in urine  Frequency: this morning Pertinent Negatives: Patient denies fever, back pain Disposition: [] ED /[] Urgent Care (no appt availability in office) / [x] Appointment(In office/virtual)/ []  [facility]/ [] Home Care/ [] Refused Recommended Disposition /[] [facility]/ []  Follow-up with PCP Additional Notes: Patient called in stating she has blood in her urine as of this morning. Patient has been to UC twice in the last week and just finished a round of Keflex for dysuria. Patient would like to follow up with evaluation due to waking up this morning with blood in urine. Appt made for tomorrow for further evaluation.     Copied from CRM [id number]. Topic: Clinical - Red Word Triage >> Jan 01, 2024  3:17 PM Elizebeth Brooking wrote: [facility] that prompted transfer to Nurse Triage: Has a UTI, went to urgent care twice gave medication. Got up this morning has blood in her urine Reason for Disposition  Urinating more frequently than usual (i.e., frequency)  Answer Assessment - Initial Assessment Questions 1. SYMPTOM: "What's the main symptom you're concerned about?" (e.g., frequency, incontinence)     Blood in urine 2. ONSET: "When did the  symptoms  start?"     12/25/2023 3. PAIN: "Is there any pain?" If Yes, ask: "How bad is it?" (Scale: 1-10; mild, moderate, severe)     no 4. CAUSE: "What do you think is causing the symptoms?"     Patient was diagnosed with UTI 5. OTHER SYMPTOMS: "Do you have any other symptoms?" (e.g., blood in urine, fever, flank pain, pain with urination)     Blood in urine, increased urgency  Protocols used: Urinary Symptoms-A-AH

## 2024-01-02 ENCOUNTER — Ambulatory Visit: Admitting: Internal Medicine

## 2024-01-18 ENCOUNTER — Other Ambulatory Visit: Payer: Self-pay | Admitting: Family Medicine

## 2024-01-25 ENCOUNTER — Ambulatory Visit
Admission: RE | Admit: 2024-01-25 | Discharge: 2024-01-25 | Disposition: A | Discharge status: Home / Self Care | Payer: Medicare Other | Source: Ambulatory Visit | Attending: Family Medicine | Admitting: Family Medicine

## 2024-01-25 DIAGNOSIS — Z1231 Encounter for screening mammogram for malignant neoplasm of breast: Secondary | ICD-10-CM | POA: Insufficient documentation

## 2024-02-27 ENCOUNTER — Ambulatory Visit (INDEPENDENT_AMBULATORY_CARE_PROVIDER_SITE_OTHER): Payer: Medicare Other

## 2024-02-27 VITALS — Ht 59.5 in | Wt 162.0 lb

## 2024-02-27 DIAGNOSIS — Z13820 Encounter for screening for osteoporosis: Secondary | ICD-10-CM

## 2024-02-27 DIAGNOSIS — Z1211 Encounter for screening for malignant neoplasm of colon: Secondary | ICD-10-CM

## 2024-02-27 DIAGNOSIS — Z0000 Encounter for general adult medical examination without abnormal findings: Secondary | ICD-10-CM | POA: Diagnosis not present

## 2024-02-27 NOTE — Patient Instructions (Signed)
 Robin Bass , Thank you for taking time to come for your Medicare Wellness Visit. I appreciate your ongoing commitment to your health goals. Please review the following plan we discussed and let me know if I can assist you in the future.   Referrals/Orders/Follow-Ups/Clinician Recommendations:   Colorectal Cancer Screening: Cologuard was ordered for patient today.    You have an order for:  []   2D Mammogram  []   3D Mammogram  [x]   Bone Density     Please call for appointment:  [facility]  [address] [place] [address] [phone]  [facility] [address] [place], [place] [address] [phone]  [facility] [address] [place]acey Friday [place], [place] [address] [phone]    Make sure to wear two-piece clothing.  No lotions, powders, or deodorants the day of the appointment. Make sure to bring picture ID and insurance card.  Bring list of medications you are currently taking including any supplements.    This is a list of the screening recommended for you and due dates:  Health Maintenance  Topic Date Due   Colon Cancer Screening  Never done   DEXA scan (bone density measurement)  Never done   Eye exam for diabetics  12/09/2023   COVID-19 Vaccine (7 - Pfizer risk 2024-25 season) 02/06/2024   Yearly kidney function blood test for diabetes  03/15/2024   Yearly kidney health urinalysis for diabetes  03/15/2024   Complete foot exam   03/26/2024   DTaP/Tdap/Td vaccine (2 - Td or Tdap) 04/01/2024   Hemoglobin A1C  04/24/2024   Flu Shot  05/24/2024   Medicare Annual Wellness Visit  02/26/2025   Mammogram  01/24/2026   Pneumonia Vaccine  Completed   Hepatitis C Screening  Completed   Zoster (Shingles) Vaccine  Completed   HPV Vaccine  Aged Out   Meningitis B Vaccine  Aged Out    Advanced directives: (In Chart) A copy of your advanced  directives are scanned into your chart should your provider ever need it.  Next Medicare Annual Wellness Visit scheduled for next year: Yes 02/27/25 @ 8:50am televisit  Have you seen your provider in the last 6 months (3 months if uncontrolled diabetes)? Yes

## 2024-02-27 NOTE — Progress Notes (Signed)
 Please attest and cosign this visit due to patients primary care provider not being in the office at the time the visit was completed.    Subjective:   Robin Bass is a 73 y.o. who presents for a Medicare Wellness preventive visit.  Visit Complete: Virtual I connected with  Robin Bass on 02/27/24 by a audio enabled telemedicine application and verified that I am speaking with the correct person using two identifiers.  Patient Location: Home  Provider Location: Office/Clinic  I discussed the limitations of evaluation and management by telemedicine. The patient expressed understanding and agreed to proceed.  Vital Signs: Because this visit was a virtual/telehealth visit, some criteria may be missing or patient reported. Any vitals not documented were not able to be obtained and vitals that have been documented are patient reported.  VideoDeclined- This patient declined Librarian, academic. Therefore the visit was completed with audio only.  Persons Participating in Visit: Patient.  AWV Questionnaire: No: Patient Medicare AWV questionnaire was not completed prior to this visit.  Cardiac Risk Factors include: advanced age (>62men, >71 women);diabetes mellitus;hypertension;dyslipidemia     Objective:    Today's Vitals   02/27/24 0856  Weight: 162 lb (73.5 kg)  Height: 4' 11.5" (1.511 m)   Body mass index is 32.17 kg/m.     02/27/2024    9:11 AM 02/23/2023    1:14 PM 09/06/2022    3:37 PM 03/03/2022    1:52 PM 03/02/2021    2:04 PM 03/02/2020   10:06 AM 12/11/2019    2:47 PM  Advanced Directives  Does Patient Have a Medical Advance Directive? Yes Yes No Yes Yes No Yes  Type of Estate agent of Robin Bass;Living will Healthcare Power of Robin Bass;Living will  Healthcare Power of Robin Bass;Living will Healthcare Power of Robin Bass;Living will  Healthcare Power of Robin Bass;Living will  Does patient want to make changes to  medical advance directive?    Yes (Inpatient - patient defers changing a medical advance directive and declines information at this time)     Copy of Healthcare Power of Attorney in Chart? Yes - validated most recent copy scanned in chart (See row information) No - copy requested  No - copy requested No - copy requested  No - copy requested    Current Medications (verified) Outpatient Encounter Medications as of 02/27/2024  Medication Sig   albuterol  (VENTOLIN  HFA) 108 (90 Base) MCG/ACT inhaler Inhale 2 puffs into the lungs every 4 (four) hours as needed for wheezing.   amLODipine  (NORVASC ) 10 MG tablet TAKE ONE TABLET BY MOUTH ONCE A DAY   atorvastatin  (LIPITOR) 20 MG tablet TAKE ONE TABLET BY MOUTH ONCE DAILY   Cholecalciferol (VITAMIN D ) 50 MCG (2000 UT) CAPS Take 2,000 Units by mouth daily at 12 noon.   clindamycin (CLEOCIN T) 1 % lotion Apply topically.   clonazePAM  (KLONOPIN ) 0.5 MG tablet TAKE ONE TABLET (0.5 MG TOTAL) BY MOUTH TWO TIMES DAILY AS NEEDED FOR ANXIETY.   FLUoxetine  (PROZAC ) 20 MG capsule TAKE ONE CAPSULE BY MOUTH ONCE DAILY   levothyroxine  (SYNTHROID ) 50 MCG tablet TAKE ONE TAB BY MOUTH ONCE DAILY. TAKE ON AN EMPTY STOMACH WITH A GLASS OF WATER ATLEAST 30-60 MINUTES BEFORE BREAKFAST   losartan -hydrochlorothiazide  (HYZAAR) 100-25 MG tablet TAKE ONE TABLET BY MOUTH ONCE DAILY   metFORMIN  (GLUCOPHAGE -XR) 500 MG 24 hr tablet TAKE ONE TABLET (500 MG TOTAL) BY MOUTH DAILY WITH BREAKFAST.   ONE TOUCH ULTRA TEST test strip USE TO  CHECK BLOOD SUGAR TWICE A DAY AND AS DIRECTED   ONETOUCH DELICA LANCETS 33G MISC Use to check blood sugar two times a day   No facility-administered encounter medications on file as of 02/27/2024.    Allergies (verified) Sulfonamide derivatives and Ace inhibitors   History: Past Medical History:  Diagnosis Date   Asthma    Basal cell carcinoma    skin   COPD (chronic obstructive pulmonary disease) (HCC)    Diabetes mellitus type 2, controlled,  without complications (HCC) 08/21/2014   GERD (gastroesophageal reflux disease)    History of pituitary tumor    Hypothyroidism    Overactive bladder    Past Surgical History:  Procedure Laterality Date   ABDOMINAL HYSTERECTOMY     Family History  Problem Relation Age of Onset   Hypertension Mother    Heart attack Father    Breast cancer Neg Hx    Social History   Socioeconomic History   Marital status: Married    Spouse name: Not on file   Number of children: 1   Years of education: Not on file   Highest education level: Not on file  Occupational History   Occupation: Producer, television/film/video: UNEMPLOYED  Tobacco Use   Smoking status: Never   Smokeless tobacco: Never  Vaping Use   Vaping status: Never Used  Substance and Sexual Activity   Alcohol use: No   Drug use: No   Sexual activity: Not on file  Other Topics Concern   Not on file  Social History Narrative   Not on file   Social Drivers of Health   Financial Resource Strain: Low Risk  (02/27/2024)   Overall Financial Resource Strain (CARDIA)    Difficulty of Paying Living Expenses: Not hard at all  Food Insecurity: No Food Insecurity (02/27/2024)   Hunger Vital Sign    Worried About Running Out of Food in the Last Year: Never true    Ran Out of Food in the Last Year: Never true  Transportation Needs: No Transportation Needs (02/27/2024)   PRAPARE - Administrator, Civil Service (Medical): No    Lack of Transportation (Non-Medical): No  Physical Activity: Inactive (02/27/2024)   Exercise Vital Sign    Days of Exercise per Week: 0 days    Minutes of Exercise per Session: 0 min  Stress: Stress Concern Present (02/27/2024)   [employer] of Occupational Health - Occupational Stress Questionnaire    Feeling of Stress : To some extent  Social Connections: Moderately Isolated (02/27/2024)   Social Connection and Isolation Panel [NHANES]    Frequency of Communication with Friends and Family: More than  three times a week    Frequency of Social Gatherings with Friends and Family: More than three times a week    Attends Religious Services: Never    Database administrator or Organizations: No    Attends Engineer, structural: Never    Marital Status: Married    Tobacco Counseling Counseling given: Not Answered    Clinical Intake:  Pre-visit preparation completed: Yes  Pain : No/denies pain     BMI - recorded: 32.17 Nutritional Status: BMI > 30  Obese Nutritional Risks: None Diabetes: Yes CBG done?: Yes (BS 117 yesterday am) CBG resulted in Enter/ Edit results?: No Did pt. bring in CBG monitor from home?: No  Lab Results  Component Value Date   HGBA1C 7.1 (A) 10/26/2023   HGBA1C 7.1 (H) 03/16/2023   HGBA1C  7.1 (A) 01/02/2023     How often do you need to have someone help you when you read instructions, pamphlets, or other written materials from your doctor or pharmacy?: 1 - Never  Interpreter Needed?: No  Comments: lives with husband Information entered by :: B.Nilza Eaker,LPN   Activities of Daily Living     02/27/2024    9:11 AM  In your present state of health, do you have any difficulty performing the following activities:  Hearing? 0  Vision? 0  Difficulty concentrating or making decisions? 0  Walking or climbing stairs? 1  Dressing or bathing? 0  Doing errands, shopping? 0  Preparing Food and eating ? N  Using the Toilet? N  In the past six months, have you accidently leaked urine? Y  Do you have problems with loss of bowel control? N  Managing your Medications? N  Managing your Finances? N  Housekeeping or managing your Housekeeping? N    Patient Care Team: Scherrie Curt, MD as PCP - General Foltanski, Delaney Fearing, [facility] (Inactive) as Pharmacist (Pharmacist)  Indicate any recent Medical Services you may have received from other than [facility] providers in the past year (date may be approximate).     Assessment:   This is a routine wellness  examination for Sharetta.  Hearing/Vision screen Hearing Screening - Comments:: Pt says her hearing is fine Vision Screening - Comments:: Pt says she wears glasses for reading and vision is fine Dr Greta Leatherwood appt   Goals Addressed             This Visit's Progress    DIET - EAT MORE FRUITS AND VEGETABLES   On track    Patient Stated   Not on track    12/11/2019, I will continue doing yoga on Wednesdays for 1 hour.      Patient Stated   On track    02/27/24, I will maintain and continue medications as prescribed.      Patient Stated   Not on track    02/27/24-Keep weight down.       Depression Screen     02/27/2024    9:06 AM 02/23/2023    1:12 PM 03/03/2022    1:51 PM 03/02/2021    2:06 PM 06/08/2020    3:09 PM 12/11/2019    2:50 PM 11/19/2018   11:23 AM  PHQ 2/9 Scores  PHQ - 2 Score 0 0 0 0 0 0 0  PHQ- 9 Score    0  0     Fall Risk     02/27/2024    9:00 AM 10/31/2023    8:58 AM 02/23/2023    1:14 PM 01/02/2023    3:08 PM 03/03/2022    1:53 PM  Fall Risk   Falls in the past year? 0 0 0 0 0  Number falls in past yr: 0 0 0 0 0  Injury with Fall? 0 0 0 0 0  Risk for fall due to : No Fall Risks No Fall Risks No Fall Risks No Fall Risks No Fall Risks  Follow up Education provided;Falls prevention discussed Falls evaluation completed Falls prevention discussed;Falls evaluation completed Falls evaluation completed Falls evaluation completed    MEDICARE RISK AT HOME:  Medicare Risk at Home Any stairs in or around the home?: Yes (couple of stairs but do not use) If so, are there any without handrails?: Yes Home free of loose throw rugs in walkways, pet beds, electrical cords, etc?: Yes Adequate lighting in your  home to reduce risk of falls?: Yes Life alert?: No Use of a cane, walker or w/c?: No Grab bars in the bathroom?: Yes Shower chair or bench in shower?: No Elevated toilet seat or a handicapped toilet?: No  TIMED UP AND GO:  Was the test performed?  No  Cognitive  Function: 6CIT completed    03/02/2021    2:09 PM 12/11/2019    2:52 PM  MMSE - Mini Mental State Exam  Orientation to time 5 5  Orientation to Place 5 5  Registration 3 3  Attention/ Calculation 5 5  Recall 3 3  Language- repeat 1 1        02/27/2024    9:15 AM 02/23/2023    1:15 PM  6CIT Screen  What Year? 0 points 0 points  What month? 0 points 0 points  What time? 0 points 0 points  Count back from 20 0 points 0 points  Months in reverse 0 points 0 points  Repeat phrase 0 points 0 points  Total Score 0 points 0 points    Immunizations Immunization History  Administered Date(s) Administered   Fluad Quad(high Dose 65+) 07/01/2019, 08/01/2022   Influenza Whole 07/31/2008, 07/12/2010   Influenza, High Dose Seasonal PF 08/08/2023   Influenza, Seasonal, Injecte, Preservative Fre 07/08/2015, 07/26/2016, 08/02/2017   Influenza,inj,Quad PF,6+ Mos 07/13/2018   PFIZER(Purple Top)SARS-COV-2 Vaccination 11/29/2019, 12/24/2019, 07/23/2020, 03/05/2021   Pfizer(Comirnaty)Fall Seasonal Vaccine 12 years and older 08/28/2022   Pneumococcal Conjugate-13 08/21/2014   Pneumococcal Polysaccharide-23 09/07/2015, 03/11/2021   Tdap 04/01/2014   Unspecified SARS-COV-2 Vaccination 08/08/2023   Zoster Recombinant(Shingrix) 03/17/2022, 06/23/2022    Screening Tests Health Maintenance  Topic Date Due   Colonoscopy  Never done   DEXA SCAN  Never done   OPHTHALMOLOGY EXAM  12/09/2023   COVID-19 Vaccine (7 - Pfizer risk 2024-25 season) 02/06/2024   Diabetic kidney evaluation - eGFR measurement  03/15/2024   Diabetic kidney evaluation - Urine ACR  03/15/2024   FOOT EXAM  03/26/2024   DTaP/Tdap/Td (2 - Td or Tdap) 04/01/2024   HEMOGLOBIN A1C  04/24/2024   INFLUENZA VACCINE  05/24/2024   Medicare Annual Wellness (AWV)  02/26/2025   MAMMOGRAM  01/24/2026   Pneumonia Vaccine 52+ Years old  Completed   Hepatitis C Screening  Completed   Zoster Vaccines- Shingrix  Completed   HPV VACCINES  Aged  Out   Meningococcal B Vaccine  Aged Out    Health Maintenance  Health Maintenance Due  Topic Date Due   Colonoscopy  Never done   DEXA SCAN  Never done   OPHTHALMOLOGY EXAM  12/09/2023   COVID-19 Vaccine (7 - Pfizer risk 2024-25 season) 02/06/2024   Diabetic kidney evaluation - eGFR measurement  03/15/2024   Diabetic kidney evaluation - Urine ACR  03/15/2024   Health Maintenance Items Addressed: DEXA ordered, Cologuard Ordered Pt says she is will make appt for eye exam with eye provider-[facility]  Additional Screening:  Vision Screening: Recommended annual ophthalmology exams for early detection of glaucoma and other disorders of the eye.  Dental Screening: Recommended annual dental exams for proper oral hygiene  Community Resource Referral / Chronic Care Management: CRR required this visit?  No   CCM required this visit?  Appt scheduled with PCP     Plan:     I have personally reviewed and noted the following in the patient's chart:   Medical and social history Use of alcohol, tobacco or illicit drugs  Current medications and supplements including  opioid prescriptions. Patient is not currently taking opioid prescriptions. Functional ability and status Nutritional status Physical activity Advanced directives List of other physicians Hospitalizations, surgeries, and ER visits in previous 12 months Vitals Screenings to include cognitive, depression, and falls Referrals and appointments  In addition, I have reviewed and discussed with patient certain preventive protocols, quality metrics, and best practice recommendations. A written personalized care plan for preventive services as well as general preventive health recommendations were provided to patient.     Nerissa Bannister, LPN   05/27/6961   After Visit Summary: (MyChart) Due to this being a telephonic visit, the after visit summary with patients personalized plan was offered to patient via MyChart   Notes:  Please refer to Routing Comments.

## 2024-02-28 ENCOUNTER — Other Ambulatory Visit: Payer: Self-pay | Admitting: Family Medicine

## 2024-02-28 DIAGNOSIS — E785 Hyperlipidemia, unspecified: Secondary | ICD-10-CM

## 2024-02-28 DIAGNOSIS — E119 Type 2 diabetes mellitus without complications: Secondary | ICD-10-CM

## 2024-02-28 DIAGNOSIS — E538 Deficiency of other specified B group vitamins: Secondary | ICD-10-CM

## 2024-02-28 DIAGNOSIS — E559 Vitamin D deficiency, unspecified: Secondary | ICD-10-CM

## 2024-02-28 DIAGNOSIS — Z79899 Other long term (current) drug therapy: Secondary | ICD-10-CM

## 2024-02-28 DIAGNOSIS — E038 Other specified hypothyroidism: Secondary | ICD-10-CM

## 2024-03-13 ENCOUNTER — Other Ambulatory Visit: Payer: Self-pay | Admitting: Family Medicine

## 2024-03-26 DIAGNOSIS — R6889 Other general symptoms and signs: Secondary | ICD-10-CM | POA: Diagnosis not present

## 2024-03-28 ENCOUNTER — Other Ambulatory Visit (INDEPENDENT_AMBULATORY_CARE_PROVIDER_SITE_OTHER)

## 2024-03-28 DIAGNOSIS — Z79899 Other long term (current) drug therapy: Secondary | ICD-10-CM | POA: Diagnosis not present

## 2024-03-28 DIAGNOSIS — E538 Deficiency of other specified B group vitamins: Secondary | ICD-10-CM

## 2024-03-28 DIAGNOSIS — E038 Other specified hypothyroidism: Secondary | ICD-10-CM | POA: Diagnosis not present

## 2024-03-28 DIAGNOSIS — E785 Hyperlipidemia, unspecified: Secondary | ICD-10-CM | POA: Diagnosis not present

## 2024-03-28 DIAGNOSIS — E119 Type 2 diabetes mellitus without complications: Secondary | ICD-10-CM

## 2024-03-28 DIAGNOSIS — E559 Vitamin D deficiency, unspecified: Secondary | ICD-10-CM

## 2024-03-28 LAB — BASIC METABOLIC PANEL WITH GFR
BUN: 22 mg/dL (ref 6–23)
CO2: 26 meq/L (ref 19–32)
Calcium: 10.2 mg/dL (ref 8.4–10.5)
Chloride: 102 meq/L (ref 96–112)
Creatinine, Ser: 0.9 mg/dL (ref 0.40–1.20)
GFR: 64.44 mL/min (ref >60.00)
Glucose, Bld: 147 mg/dL — ABNORMAL HIGH (ref 70–99)
Potassium: 4 meq/L (ref 3.5–5.1)
Sodium: 141 meq/L (ref 135–145)

## 2024-03-28 LAB — CBC WITH DIFFERENTIAL/PLATELET
Basophils Absolute: 0.1 10*3/uL (ref 0.0–0.1)
Basophils Relative: 0.8 % (ref 0.0–3.0)
Eosinophils Absolute: 0.2 10*3/uL (ref 0.0–0.7)
Eosinophils Relative: 2 % (ref 0.0–5.0)
HCT: 31.1 % — ABNORMAL LOW (ref 36.0–46.0)
Hemoglobin: 9.8 g/dL — ABNORMAL LOW (ref 12.0–15.0)
Lymphocytes Relative: 29.8 % (ref 12.0–46.0)
Lymphs Abs: 2.4 10*3/uL (ref 0.7–4.0)
MCHC: 31.4 g/dL (ref 30.0–36.0)
MCV: 65.2 fl — ABNORMAL LOW (ref 78.0–100.0)
Monocytes Absolute: 0.5 10*3/uL (ref 0.1–1.0)
Monocytes Relative: 5.9 % (ref 3.0–12.0)
Neutro Abs: 5 10*3/uL (ref 1.4–7.7)
Neutrophils Relative %: 61.5 % (ref 43.0–77.0)
Platelets: 522 10*3/uL — ABNORMAL HIGH (ref 150.0–400.0)
RBC: 4.76 Mil/uL (ref 3.87–5.11)
RDW: 17.7 % — ABNORMAL HIGH (ref 11.5–15.5)
WBC: 8.1 10*3/uL (ref 4.0–10.5)

## 2024-03-28 LAB — LIPID PANEL
Cholesterol: 150 mg/dL (ref 0–200)
HDL: 64.2 mg/dL (ref >39.00)
LDL Cholesterol: 63 mg/dL (ref 0–99)
NonHDL: 85.99
Total CHOL/HDL Ratio: 2
Triglycerides: 113 mg/dL (ref 0.0–149.0)
VLDL: 22.6 mg/dL (ref 0.0–40.0)

## 2024-03-28 LAB — HEPATIC FUNCTION PANEL
ALT: 10 U/L (ref 0–35)
AST: 12 U/L (ref 0–37)
Albumin: 4.2 g/dL (ref 3.5–5.2)
Alkaline Phosphatase: 112 U/L (ref 39–117)
Bilirubin, Direct: 0.1 mg/dL (ref 0.0–0.3)
Total Bilirubin: 0.4 mg/dL (ref 0.2–1.2)
Total Protein: 7.1 g/dL (ref 6.0–8.3)

## 2024-03-28 LAB — HEMOGLOBIN A1C: Hgb A1c MFr Bld: 7.7 % — ABNORMAL HIGH (ref 4.6–6.5)

## 2024-03-28 LAB — VITAMIN B12: Vitamin B-12: >1500 pg/mL — ABNORMAL HIGH (ref 211–911)

## 2024-03-28 LAB — MICROALBUMIN / CREATININE URINE RATIO
Creatinine,U: 128.1 mg/dL
Microalb Creat Ratio: 6.8 mg/g (ref 0.0–30.0)
Microalb, Ur: 0.9 mg/dL (ref 0.0–1.9)

## 2024-03-28 LAB — TSH: TSH: 5.59 u[IU]/mL — ABNORMAL HIGH (ref 0.35–5.50)

## 2024-03-28 LAB — VITAMIN D 25 HYDROXY (VIT D DEFICIENCY, FRACTURES): VITD: 27.93 ng/mL — ABNORMAL LOW (ref 30.00–100.00)

## 2024-03-28 LAB — T4, FREE: Free T4: 1.09 ng/dL (ref 0.60–1.60)

## 2024-03-28 LAB — T3, FREE: T3, Free: 3.3 pg/mL (ref 2.3–4.2)

## 2024-03-29 ENCOUNTER — Ambulatory Visit: Payer: Self-pay | Admitting: Family Medicine

## 2024-03-29 ENCOUNTER — Other Ambulatory Visit: Payer: Self-pay | Admitting: Family Medicine

## 2024-04-02 ENCOUNTER — Other Ambulatory Visit: Payer: Self-pay | Admitting: Family Medicine

## 2024-04-04 ENCOUNTER — Encounter: Payer: Self-pay | Admitting: Family Medicine

## 2024-04-04 ENCOUNTER — Ambulatory Visit (INDEPENDENT_AMBULATORY_CARE_PROVIDER_SITE_OTHER): Admitting: Family Medicine

## 2024-04-04 VITALS — BP 130/60 | HR 92 | Temp 98.9°F | Ht 59.25 in | Wt 164.4 lb

## 2024-04-04 DIAGNOSIS — M8580 Other specified disorders of bone density and structure, unspecified site: Secondary | ICD-10-CM

## 2024-04-04 DIAGNOSIS — Z0000 Encounter for general adult medical examination without abnormal findings: Secondary | ICD-10-CM

## 2024-04-04 DIAGNOSIS — Z780 Asymptomatic menopausal state: Secondary | ICD-10-CM | POA: Diagnosis not present

## 2024-04-04 MED ORDER — MELOXICAM 15 MG PO TABS: 15.0000 mg | ORAL_TABLET | Freq: Every day | ORAL | 3 refills | Status: DC

## 2024-04-04 NOTE — Progress Notes (Signed)
 [place] Behlke T. Haileyann Staiger, MD, CAQ Sports Medicine [facility] [address] [place], [address]  Phone: [phone]  FAX: [phone]  Robin Bass - 73 y.o. female  MRN [id number]  Date of Birth: 1950-11-29  Date: 04/04/2024  PCP: Scherrie Curt, MD  Referral: Scherrie Curt, MD  Chief Complaint  Patient presents with   Annual Exam    Part 2   Patient Care Team: Scherrie Curt, MD as PCP - General Foltanski, Delaney Fearing, RPH (Inactive) as Pharmacist (Pharmacist) Arminda Berth, OD (Optometry) Subjective:   Robin Bass is a 73 y.o. pleasant patient who presents with the following:  Health Maintenance Summary Reviewed and updated, unless pt declines services.  Tobacco History Reviewed. Non-smoker Alcohol: No concerns, no excessive use Exercise Habits: Some activity, rec at least 30 mins 5 times a week STD concerns: none Drug Use: None Lumps or breast concerns: no  Will not get colonoscopy - Hgb 9 - She will probably do Cologuard  Metformin  500 mg a day right now  Colon cancer screening - cologuard is at home Bone density Eye exam - will get Tdap - check with pharmacy Foot exam  Health Maintenance  Topic Date Due   Colonoscopy  Never done   DEXA SCAN  Never done   OPHTHALMOLOGY EXAM  12/09/2023   COVID-19 Vaccine (7 - Pfizer risk 2024-25 season) 02/06/2024   DTaP/Tdap/Td (2 - Td or Tdap) 04/01/2024   FOOT EXAM  03/26/2024   INFLUENZA VACCINE  05/24/2024   HEMOGLOBIN A1C  09/27/2024   Medicare Annual Wellness (AWV)  02/26/2025   Diabetic kidney evaluation - eGFR measurement  03/28/2025   Diabetic kidney evaluation - Urine ACR  03/28/2025   MAMMOGRAM  01/24/2026   Pneumococcal Vaccine: 50+ Years  Completed   Hepatitis C Screening  Completed   Zoster Vaccines- Shingrix  Completed   HPV VACCINES  Aged Out   Meningococcal B Vaccine  Aged Out   Diabetes Mellitus: Tolerating Medications: yes Compliance  with diet: fair, Body mass index is 32.92 kg/m. Exercise: minimal / intermittent Avg blood sugars at home: not checking Foot problems: none Hypoglycemia: none No nausea, vomitting, blurred vision, polyuria.  Lab Results  Component Value Date   HGBA1C 7.7 (H) 03/28/2024   HGBA1C 7.1 (A) 10/26/2023   HGBA1C 7.1 (H) 03/16/2023   Lab Results  Component Value Date   MICROALBUR 0.9 03/28/2024   LDLCALC 63 03/28/2024   CREATININE 0.90 03/28/2024    Wt Readings from Last 3 Encounters:  04/04/24 164 lb 6 oz (74.6 kg)  02/27/24 162 lb (73.5 kg)  11/13/23 162 lb 2 oz (73.5 kg)    HTN: Tolerating all medications without side effects Stable and at goal No CP, no sob. No HA.  BP Readings from Last 3 Encounters:  04/04/24 130/60  12/25/23 125/70  11/13/23 112/62    Basic Metabolic Panel:    Component Value Date/Time   NA 141 03/28/2024 0741   K 4.0 03/28/2024 0741   CL 102 03/28/2024 0741   CO2 26 03/28/2024 0741   BUN 22 03/28/2024 0741   CREATININE 0.90 03/28/2024 0741   GLUCOSE 147 (H) 03/28/2024 0741   CALCIUM  10.2 03/28/2024 0741    Lipids: Doing well, stable. Tolerating meds fine with no SE. Panel reviewed with patient.  Lipids: Lab Results  Component Value Date   CHOL 150 03/28/2024   Lab Results  Component Value Date   HDL 64.20 03/28/2024  Lab Results  Component Value Date   LDLCALC 63 03/28/2024   Lab Results  Component Value Date   TRIG 113.0 03/28/2024   Lab Results  Component Value Date   CHOLHDL 2 03/28/2024    Lab Results  Component Value Date   ALT 10 03/28/2024   AST 12 03/28/2024   ALKPHOS 112 03/28/2024   BILITOT 0.4 03/28/2024     Immunization History  Administered Date(s) Administered   Fluad Quad(high Dose 65+) 07/01/2019, 08/01/2022   Influenza Whole 07/31/2008, 07/12/2010   Influenza, High Dose Seasonal PF 08/08/2023   Influenza, Seasonal, Injecte, Preservative Fre 07/08/2015, 07/26/2016, 08/02/2017   Influenza,inj,Quad  PF,6+ Mos 07/13/2018   PFIZER(Purple Top)SARS-COV-2 Vaccination 11/29/2019, 12/24/2019, 07/23/2020, 03/05/2021   Pfizer(Comirnaty)Fall Seasonal Vaccine 12 years and older 08/28/2022   Pneumococcal Conjugate-13 08/21/2014   Pneumococcal Polysaccharide-23 09/07/2015, 03/11/2021   Tdap 04/01/2014   Unspecified SARS-COV-2 Vaccination 08/08/2023   Zoster Recombinant(Shingrix) 03/17/2022, 06/23/2022   Patient Active Problem List   Diagnosis Date Noted   Diabetes mellitus type 2, controlled, without complications (HCC) 08/21/2014    Priority: High   Hyperlipidemia LDL goal <70 09/07/2015    Priority: Medium    Hypothyroidism 07/31/2008    Priority: Medium    Essential hypertension 07/31/2008    Priority: Medium    Depression 03/31/2012   Overactive bladder 02/05/2011   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   Asthma 07/31/2008   COPD 07/31/2008   GERD 07/31/2008    Past Medical History:  Diagnosis Date   Asthma    Basal cell carcinoma    skin   COPD (chronic obstructive pulmonary disease) (HCC)    Diabetes mellitus type 2, controlled, without complications (HCC) 08/21/2014   GERD (gastroesophageal reflux disease)    History of pituitary tumor    Hypothyroidism    Overactive bladder     Past Surgical History:  Procedure Laterality Date   ABDOMINAL HYSTERECTOMY      Family History  Problem Relation Age of Onset   Hypertension Mother    Heart attack Father    Breast cancer Neg Hx     Social History   Social History Narrative   Not on file    Past Medical History, Surgical History, Social History, Family History, Problem List, Medications, and Allergies have been reviewed and updated if relevant.  Review of Systems: Pertinent positives are listed above.  Otherwise, a full 14 point review of systems has been done in full and it is negative except where it is noted positive.  Objective:   BP 130/60   Pulse 92   Temp 98.9 F (37.2 C) (Temporal)   Ht 4' 11.25 (1.505  m)   Wt 164 lb 6 oz (74.6 kg)   SpO2 94%   BMI 32.92 kg/m  Ideal Body Weight: Weight in (lb) to have BMI = 25: 124.6 No results found.    02/27/2024    9:06 AM 02/23/2023    1:12 PM 03/03/2022    1:51 PM 03/02/2021    2:06 PM 06/08/2020    3:09 PM  Depression screen PHQ 2/9  Decreased Interest 0 0 0 0 0  Down, Depressed, Hopeless 0 0 0 0 0  PHQ - 2 Score 0 0 0 0 0  Altered sleeping    0   Tired, decreased energy    0   Change in appetite    0   Feeling bad or failure about yourself     0   Trouble concentrating  0   Moving slowly or fidgety/restless    0   Suicidal thoughts    0   PHQ-9 Score    0   Difficult doing work/chores    Not difficult at all      GEN: well developed, well nourished, no acute distress Eyes: conjunctiva and lids normal, PERRLA, EOMI ENT: TM clear, nares clear, oral exam WNL Neck: supple, no lymphadenopathy, no thyromegaly, no JVD Pulm: clear to auscultation and percussion, respiratory effort normal CV: regular rate and rhythm, S1-S2, no murmur, rub or gallop, no bruits Chest: no scars, masses, no lumps BREAST: breast exam declined GI: soft, non-tender; no hepatosplenomegaly, masses; active bowel sounds all quadrants GU: GU exam declined Lymph: no cervical, axillary or inguinal adenopathy MSK: gait normal, muscle tone and strength WNL, no joint swelling, effusions, discoloration, crepitus  SKIN: clear, good turgor, color WNL, no rashes, lesions, or ulcerations Neuro: normal mental status, normal strength, sensation, and motion Psych: alert; oriented to person, place and time, normally interactive and not anxious or depressed in appearance.   All labs reviewed with patient. Results for orders placed or performed in visit on 03/28/24  VITAMIN D  25 Hydroxy (Vit-D Deficiency, Fractures)   Collection Time: 03/28/24  7:41 AM  Result Value Ref Range   VITD 27.93 (L) 30.00 - 100.00 ng/mL  Vitamin B12   Collection Time: 03/28/24  7:41 AM  Result Value  Ref Range   Vitamin B-12 >1500 (H) 211 - 911 pg/mL  T4, free   Collection Time: 03/28/24  7:41 AM  Result Value Ref Range   Free T4 1.09 0.60 - 1.60 ng/dL  T3, free   Collection Time: 03/28/24  7:41 AM  Result Value Ref Range   T3, Free 3.3 2.3 - 4.2 pg/mL  TSH   Collection Time: 03/28/24  7:41 AM  Result Value Ref Range   TSH 5.59 (H) 0.35 - 5.50 uIU/mL  Microalbumin / creatinine urine ratio   Collection Time: 03/28/24  7:41 AM  Result Value Ref Range   Microalb, Ur 0.9 0.0 - 1.9 mg/dL   Creatinine,U 161.0 mg/dL   Microalb Creat Ratio 6.8 0.0 - 30.0 mg/g  Lipid panel   Collection Time: 03/28/24  7:41 AM  Result Value Ref Range   Cholesterol 150 0 - 200 mg/dL   Triglycerides 960.4 0.0 - 149.0 mg/dL   HDL 54.09 >81.19 mg/dL   VLDL 14.7 0.0 - 82.9 mg/dL   LDL Cholesterol 63 0 - 99 mg/dL   Total CHOL/HDL Ratio 2    NonHDL 85.99   Hemoglobin A1c   Collection Time: 03/28/24  7:41 AM  Result Value Ref Range   Hgb A1c MFr Bld 7.7 (H) 4.6 - 6.5 %  Hepatic function panel   Collection Time: 03/28/24  7:41 AM  Result Value Ref Range   Total Bilirubin 0.4 0.2 - 1.2 mg/dL   Bilirubin, Direct 0.1 0.0 - 0.3 mg/dL   Alkaline Phosphatase 112 39 - 117 U/L   AST 12 0 - 37 U/L   ALT 10 0 - 35 U/L   Total Protein 7.1 6.0 - 8.3 g/dL   Albumin 4.2 3.5 - 5.2 g/dL  CBC with Differential/Platelet   Collection Time: 03/28/24  7:41 AM  Result Value Ref Range   WBC 8.1 4.0 - 10.5 K/uL   RBC 4.76 3.87 - 5.11 Mil/uL   Hemoglobin 9.8 (L) 12.0 - 15.0 g/dL   HCT 56.2 (L) 13.0 - 86.5 %   MCV 65.2 Repeated  and verified X2. (L) 78.0 - 100.0 fl   MCHC 31.4 30.0 - 36.0 g/dL   RDW 16.1 (H) 09.6 - 04.5 %   Platelets 522.0 (H) 150.0 - 400.0 K/uL   Neutrophils Relative % 61.5 43.0 - 77.0 %   Lymphocytes Relative 29.8 12.0 - 46.0 %   Monocytes Relative 5.9 3.0 - 12.0 %   Eosinophils Relative 2.0 0.0 - 5.0 %   Basophils Relative 0.8 0.0 - 3.0 %   Neutro Abs 5.0 1.4 - 7.7 K/uL   Lymphs Abs 2.4 0.7 -  4.0 K/uL   Monocytes Absolute 0.5 0.1 - 1.0 K/uL   Eosinophils Absolute 0.2 0.0 - 0.7 K/uL   Basophils Absolute 0.1 0.0 - 0.1 K/uL  Basic metabolic panel with GFR   Collection Time: 03/28/24  7:41 AM  Result Value Ref Range   Sodium 141 135 - 145 mEq/L   Potassium 4.0 3.5 - 5.1 mEq/L   Chloride 102 96 - 112 mEq/L   CO2 26 19 - 32 mEq/L   Glucose, Bld 147 (H) 70 - 99 mg/dL   BUN 22 6 - 23 mg/dL   Creatinine, Ser 4.09 0.40 - 1.20 mg/dL   GFR 81.19 >14.78 mL/min   Calcium  10.2 8.4 - 10.5 mg/dL   No results found.  Assessment and Plan:     ICD-10-CM   1. Healthcare maintenance  Z00.00     2. Post-menopausal  Z78.0 DG Bone Density    3. Age-related bone loss  M85.80 DG Bone Density     I am very concerned that the patient has a hemoglobin of 9.  She has never had any colon cancer screening.  I tried to strongly encourage colonoscopy but at this time she does not want to do this.  He does not have any obvious bleeding. she is open to doing Cologuard.  This is better than nothing, and she understands that if her Cologuard came back positive then she definitely needs to get colonoscopy. - Hypochromic anemia, most likely iron deficiency and will have her start some daily iron sulfate.  A1c is also elevated at 7.7.  She does not think she has been doing a very good job with her eating habit.  She will work on this and we can recheck her labs in 3 months.  She is also going to drop her B12 dosage.  I am going to order a DEXA scan.  Health Maintenance Exam: The patient's preventative maintenance and recommended screening tests for an annual wellness exam were reviewed in full today. Brought up to date unless services declined.  Counselled on the importance of diet, exercise, and its role in overall health and mortality. The patient's FH and SH was reviewed, including their home life, tobacco status, and drug and alcohol status.  Follow-up in 1 year for physical exam or additional  follow-up below.  Disposition: Return in about 3 months (around 07/05/2024) for diabetes.  Future Appointments  Date Time Provider Department Center  07/08/2024  2:00 PM Ramsie Ostrander, Jolena Nay, MD [facility]  02/27/2025  8:50 AM [facility] ANNUAL WELLNESS VISIT 1 [facility]    Meds ordered this encounter  Medications   meloxicam  (MOBIC ) 15 MG tablet    Sig: Take 1 tablet (15 mg total) by mouth daily.    Dispense:  30 tablet    Refill:  3   There are no discontinued medications. Orders Placed This Encounter  Procedures   DG Bone Density    Signed,  Jolena Nay  Mayra Specter, MD   Allergies as of 04/04/2024       Reactions   Sulfonamide Derivatives    Ace Inhibitors Cough        Medication List        Accurate as of April 04, 2024 11:59 PM. If you have any questions, ask your nurse or doctor.          albuterol  108 (90 Base) MCG/ACT inhaler Commonly known as: VENTOLIN  HFA Inhale 2 puffs into the lungs every 4 (four) hours as needed for wheezing.   amLODipine  10 MG tablet Commonly known as: NORVASC  TAKE ONE TABLET BY MOUTH ONCE A DAY   atorvastatin  20 MG tablet Commonly known as: LIPITOR TAKE ONE TABLET BY MOUTH ONCE DAILY   clindamycin 1 % lotion Commonly known as: CLEOCIN T Apply topically.   clonazePAM  0.5 MG tablet Commonly known as: KLONOPIN  TAKE ONE TABLET (0.5 MG TOTAL) BY MOUTH TWO TIMES DAILY AS NEEDED FOR ANXIETY.   FLUoxetine  20 MG capsule Commonly known as: PROZAC  TAKE ONE CAPSULE BY MOUTH ONCE DAILY   levothyroxine  50 MCG tablet Commonly known as: SYNTHROID  TAKE ONE TAB BY MOUTH ONCE DAILY. TAKE ON AN EMPTY STOMACH WITH A GLASS OF WATER ATLEAST 30-60 MINUTES BEFORE BREAKFAST   losartan -hydrochlorothiazide  100-25 MG tablet Commonly known as: HYZAAR TAKE ONE TABLET BY MOUTH ONCE DAILY   meloxicam  15 MG tablet Commonly known as: MOBIC  Take 1 tablet (15 mg total) by mouth daily. Started by: Jolena Nay Florencia Zaccaro   metFORMIN  500 MG 24 hr  tablet Commonly known as: GLUCOPHAGE -XR TAKE ONE TABLET (500 MG TOTAL) BY MOUTH DAILY WITH BREAKFAST.   ONE TOUCH ULTRA TEST test strip Generic drug: glucose blood USE TO CHECK BLOOD SUGAR TWICE A DAY AND AS DIRECTED   OneTouch Delica Lancets 33G Misc Use to check blood sugar two times a day   Vitamin D  50 MCG (2000 UT) Caps Take 2,000 Units by mouth daily at 12 noon.

## 2024-04-04 NOTE — Patient Instructions (Signed)
 Vitamin 2,000?  Check how much Vit D you are taking, definitely at least 2,000 units a day - if you are already taking 2,000 units, increase it to 4,000 units  Check your B12 dosage - Definitely no more than 1,000 micrograms a day - If you are taking 1,000 micrograms a day right now, drop it to 500 mcg/day.  Iron Sulfate 325 mg, once a day

## 2024-04-09 DIAGNOSIS — Z1211 Encounter for screening for malignant neoplasm of colon: Secondary | ICD-10-CM | POA: Diagnosis not present

## 2024-04-15 ENCOUNTER — Other Ambulatory Visit: Payer: Self-pay | Admitting: Family Medicine

## 2024-04-17 LAB — COLOGUARD: COLOGUARD: POSITIVE — AB

## 2024-04-18 ENCOUNTER — Ambulatory Visit: Payer: Self-pay | Admitting: Family Medicine

## 2024-04-19 ENCOUNTER — Other Ambulatory Visit: Payer: Self-pay | Admitting: Family Medicine

## 2024-04-19 DIAGNOSIS — Z1211 Encounter for screening for malignant neoplasm of colon: Secondary | ICD-10-CM

## 2024-04-19 DIAGNOSIS — R195 Other fecal abnormalities: Secondary | ICD-10-CM

## 2024-04-19 NOTE — Progress Notes (Signed)
 Consult made

## 2024-04-27 ENCOUNTER — Other Ambulatory Visit: Payer: Self-pay | Admitting: Family Medicine

## 2024-05-06 DIAGNOSIS — E119 Type 2 diabetes mellitus without complications: Secondary | ICD-10-CM | POA: Diagnosis not present

## 2024-05-06 DIAGNOSIS — H52213 Irregular astigmatism, bilateral: Secondary | ICD-10-CM | POA: Diagnosis not present

## 2024-05-06 DIAGNOSIS — Z9842 Cataract extraction status, left eye: Secondary | ICD-10-CM | POA: Diagnosis not present

## 2024-05-06 DIAGNOSIS — R195 Other fecal abnormalities: Secondary | ICD-10-CM | POA: Diagnosis not present

## 2024-05-06 DIAGNOSIS — Z9841 Cataract extraction status, right eye: Secondary | ICD-10-CM | POA: Diagnosis not present

## 2024-05-06 LAB — HM DIABETES EYE EXAM

## 2024-05-07 DIAGNOSIS — L918 Other hypertrophic disorders of the skin: Secondary | ICD-10-CM | POA: Diagnosis not present

## 2024-05-07 DIAGNOSIS — Z85828 Personal history of other malignant neoplasm of skin: Secondary | ICD-10-CM | POA: Diagnosis not present

## 2024-05-07 DIAGNOSIS — L570 Actinic keratosis: Secondary | ICD-10-CM | POA: Diagnosis not present

## 2024-05-07 DIAGNOSIS — L821 Other seborrheic keratosis: Secondary | ICD-10-CM | POA: Diagnosis not present

## 2024-05-07 DIAGNOSIS — L7211 Pilar cyst: Secondary | ICD-10-CM | POA: Diagnosis not present

## 2024-05-07 DIAGNOSIS — D3612 Benign neoplasm of peripheral nerves and autonomic nervous system, upper limb, including shoulder: Secondary | ICD-10-CM | POA: Diagnosis not present

## 2024-05-07 DIAGNOSIS — D1801 Hemangioma of skin and subcutaneous tissue: Secondary | ICD-10-CM | POA: Diagnosis not present

## 2024-05-07 DIAGNOSIS — L820 Inflamed seborrheic keratosis: Secondary | ICD-10-CM | POA: Diagnosis not present

## 2024-05-07 DIAGNOSIS — D2239 Melanocytic nevi of other parts of face: Secondary | ICD-10-CM | POA: Diagnosis not present

## 2024-05-24 ENCOUNTER — Other Ambulatory Visit: Payer: Self-pay | Admitting: Internal Medicine

## 2024-05-24 ENCOUNTER — Ambulatory Visit

## 2024-05-24 DIAGNOSIS — R195 Other fecal abnormalities: Secondary | ICD-10-CM | POA: Diagnosis not present

## 2024-05-24 DIAGNOSIS — D125 Benign neoplasm of sigmoid colon: Secondary | ICD-10-CM | POA: Diagnosis not present

## 2024-05-24 DIAGNOSIS — C184 Malignant neoplasm of transverse colon: Secondary | ICD-10-CM | POA: Diagnosis not present

## 2024-05-24 DIAGNOSIS — K5730 Diverticulosis of large intestine without perforation or abscess without bleeding: Secondary | ICD-10-CM | POA: Diagnosis not present

## 2024-05-24 DIAGNOSIS — Z1211 Encounter for screening for malignant neoplasm of colon: Secondary | ICD-10-CM | POA: Diagnosis not present

## 2024-05-24 DIAGNOSIS — K640 First degree hemorrhoids: Secondary | ICD-10-CM | POA: Diagnosis not present

## 2024-05-24 DIAGNOSIS — C189 Malignant neoplasm of colon, unspecified: Secondary | ICD-10-CM

## 2024-05-28 ENCOUNTER — Ambulatory Visit
Admission: RE | Admit: 2024-05-28 | Discharge: 2024-05-28 | Disposition: A | Discharge status: Home / Self Care | Source: Ambulatory Visit | Attending: Internal Medicine | Admitting: Internal Medicine

## 2024-05-28 DIAGNOSIS — C189 Malignant neoplasm of colon, unspecified: Secondary | ICD-10-CM | POA: Diagnosis not present

## 2024-05-28 DIAGNOSIS — K760 Fatty (change of) liver, not elsewhere classified: Secondary | ICD-10-CM | POA: Diagnosis not present

## 2024-05-28 DIAGNOSIS — K5730 Diverticulosis of large intestine without perforation or abscess without bleeding: Secondary | ICD-10-CM | POA: Diagnosis not present

## 2024-05-28 DIAGNOSIS — C184 Malignant neoplasm of transverse colon: Secondary | ICD-10-CM | POA: Diagnosis not present

## 2024-05-28 DIAGNOSIS — R935 Abnormal findings on diagnostic imaging of other abdominal regions, including retroperitoneum: Secondary | ICD-10-CM | POA: Diagnosis not present

## 2024-05-28 LAB — POCT I-STAT CREATININE: Creatinine, Ser: 1 mg/dL (ref 0.44–1.00)

## 2024-05-28 MED ORDER — IOHEXOL 300 MG/ML  SOLN
100.0000 mL | Freq: Once | INTRAMUSCULAR | Status: AC | PRN
  Administered 2024-05-28: 100 mL via INTRAVENOUS

## 2024-05-29 ENCOUNTER — Ambulatory Visit: Payer: Self-pay | Admitting: Surgery

## 2024-05-29 DIAGNOSIS — C184 Malignant neoplasm of transverse colon: Secondary | ICD-10-CM

## 2024-05-29 NOTE — H&P (Signed)
 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  History of Present Illness Noted on most recent cscope after positive cologuard. Asymptomaic.   Past Medical History:  has no past medical history on file.   Past Surgical History:  has no past surgical history on file.   Family History: family history is not on file.   Social History:  reports that she has never smoked. She has never used smokeless tobacco. No history on file for alcohol use and drug use.   Current Medications: has a current medication list which includes the following prescription(s): albuterol  mdi (proventil , ventolin , proair ) hfa, amlodipine  besylate, ascorbic acid, atorvastatin  calcium , cholecalciferol (vitamin d3), cyanocobalamin , ferrous sulfate, fluoxetine  hcl, levothyroxine  sodium, losartan -hydrochlorothiazide , meloxicam , meloxicam , metformin , and sodium, potassium, and magnesium.   Allergies:  Allergies      Allergies  Allergen Reactions   Sulfa (Sulfonamide Antibiotics) Rash   Ace Inhibitors Cough        ROS:  A 15 point review of systems was performed and pertinent positives and negatives noted in HPI   Objective:    Objective BP 127/69   Pulse 86   Ht 149.9 cm (4' 11)   Wt 74.4 kg (164 lb)   BMI 33.12 kg/m    Constitutional :  No distress, cooperative, alert  Lymphatics/Throat:  Supple with no lymphadenopathy  Respiratory:  Clear to auscultation bilaterally  Cardiovascular:  Regular rate and rhythm  Gastrointestinal: Soft, non-tender, non-distended, no organomegaly.  Musculoskeletal: Steady gait and movement  Skin: Cool and moist  Psychiatric: Normal affect, non-agitated, not confused         LABS:  CEA-   RADS: EXAM:  CT CHEST, ABDOMEN AND PELVIS WITH CONTRAST  05/28/2024 11:12:28 AM   TECHNIQUE:  CT of the chest, abdomen and pelvis was performed with the administration of   intravenous contrast. Multiplanar reformatted images are provided for review.  Automated exposure control, iterative reconstruction, and/or weight based  adjustment of the mA/kV was utilized to reduce the radiation dose to as low as  reasonably achievable.   COMPARISON:  10/31/2023   CLINICAL HISTORY:  Recent colon cancer diagnosis.   FINDINGS:   CHEST:   MEDIASTINUM:  Heart and pericardium are unremarkable. The central airways are clear.   THORACIC LYMPH NODES:  No mediastinal, hilar or axillary lymphadenopathy.   LUNGS AND PLEURA:  No focal consolidation or pulmonary edema. No pleural effusion or pneumothorax.   ABDOMEN AND PELVIS:   LIVER:  Focal fatty infiltration along the falciform ligament. No mass. No intrahepatic  or extrahepatic biliary ductal dilation. The portal veins are patent.   GALLBLADDER AND BILE DUCTS:  Gallbladder is unremarkable. No biliary ductal dilatation.   SPLEEN:  Innumerable subcentimeter hypodensities throughout the spleen, which is not  enlarged.   PANCREAS:  Mild fatty infiltration of the pancreatic head and uncinate process. No  pancreatic ductal dilation.   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   KIDNEYS, URETERS AND BLADDER:  A few small subcentimeter hypodensities noted in both kidneys, too small to  definitively characterize, but likely small cysts. No stones in the kidneys or  ureters. No hydronephrosis. No perinephric or periureteral stranding. Urinary  bladder is unremarkable.   GI AND BOWEL:  Small amounts of ingested material layering in the stomach. There is no bowel  obstruction. Normal appendix. Asymmetric wall thickening of the proximal  transverse  colon with pericolonic inflammation (axial 69). This measures 4.9 cm  in length ( sagittal 71 ). Scattered colonic diverticulosis.   REPRODUCTIVE ORGANS:  Hysterectomy.   PERITONEUM AND RETROPERITONEUM:  No ascites. No free air.    VASCULATURE:  Diffuse aortoiliac atherosclerosis without hemodynamically significant  significant stenosis.   ABDOMINAL AND PELVIS LYMPH NODES:  No lymphadenopathy.   BONES AND SOFT TISSUES:  Multilevel degenerative disc disease throughout the thoracolumbar spine. Mild,  grade 1 anterolisthesis of L3 on L4, likely degenerative. No focal soft tissue  abnormality.   IMPRESSION:  1. Asymmetric wall thickening of the proximal transverse colon with pericolonic  inflammation, consistent with recent colon cancer diagnosis. Given the adjacent  inflammation, this is worrisome for early transmural extension. Follow up  colonoscopy recommended for confirmation.  2. Numerous subcentimeter hypodensities throughout the spleen possibly small  cyst or hemangiomas. Nonemergent multiphase abdominal MRI with iv contrast  recommended for further characterization to exclude underlying neoplasm or  metastatic disease.  3. No intraabdominal or pelvic lymphadenopathy. No distant metastatic disease  within the chest.  4. Scattered colonic diverticulosis without evidence of diverticulitis.   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  The risk of laparoscopic colon resection surgery includes, but not limited to, recurrence, bleeding, chronic pain, post-op infxn, post-op SBO or ileus, hernias, resection of bowel, re-anastamosis, possible ostomy placement and need for re-operation to address said risks. The risks of general anesthetic, if used, includes MI, CVA, sudden death or even reaction to anesthetic medications also discussed. Alternatives include continued observation.  Benefits include possible symptom relief, preventing further decline in health and possible death.   Typical post-op  recovery time of additional days in hospital for observation afterwards also discussed.   Prep ordered.  Will proceed with ERAS protocol as well.  Pending medical clearance and workup for spleen.   The patient verbalized understanding and all questions were answered to the patient's satisfaction.   labs/images/medications/previous chart entries reviewed personally and relevant changes/updates noted above.

## 2024-05-29 NOTE — H&P (View-Only) (Signed)
 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  History of Present Illness Noted on most recent cscope after positive cologuard. Asymptomaic.   Past Medical History:  has no past medical history on file.   Past Surgical History:  has no past surgical history on file.   Family History: family history is not on file.   Social History:  reports that she has never smoked. She has never used smokeless tobacco. No history on file for alcohol use and drug use.   Current Medications: has a current medication list which includes the following prescription(s): albuterol  mdi (proventil , ventolin , proair ) hfa, amlodipine  besylate, ascorbic acid, atorvastatin  calcium , cholecalciferol (vitamin d3), cyanocobalamin , ferrous sulfate, fluoxetine  hcl, levothyroxine  sodium, losartan -hydrochlorothiazide , meloxicam , meloxicam , metformin , and sodium, potassium, and magnesium.   Allergies:  Allergies      Allergies  Allergen Reactions   Sulfa (Sulfonamide Antibiotics) Rash   Ace Inhibitors Cough        ROS:  A 15 point review of systems was performed and pertinent positives and negatives noted in HPI   Objective:    Objective BP 127/69   Pulse 86   Ht 149.9 cm (4' 11)   Wt 74.4 kg (164 lb)   BMI 33.12 kg/m    Constitutional :  No distress, cooperative, alert  Lymphatics/Throat:  Supple with no lymphadenopathy  Respiratory:  Clear to auscultation bilaterally  Cardiovascular:  Regular rate and rhythm  Gastrointestinal: Soft, non-tender, non-distended, no organomegaly.  Musculoskeletal: Steady gait and movement  Skin: Cool and moist  Psychiatric: Normal affect, non-agitated, not confused         LABS:  CEA-   RADS: EXAM:  CT CHEST, ABDOMEN AND PELVIS WITH CONTRAST  05/28/2024 11:12:28 AM   TECHNIQUE:  CT of the chest, abdomen and pelvis was performed with the administration of   intravenous contrast. Multiplanar reformatted images are provided for review.  Automated exposure control, iterative reconstruction, and/or weight based  adjustment of the mA/kV was utilized to reduce the radiation dose to as low as  reasonably achievable.   COMPARISON:  10/31/2023   CLINICAL HISTORY:  Recent colon cancer diagnosis.   FINDINGS:   CHEST:   MEDIASTINUM:  Heart and pericardium are unremarkable. The central airways are clear.   THORACIC LYMPH NODES:  No mediastinal, hilar or axillary lymphadenopathy.   LUNGS AND PLEURA:  No focal consolidation or pulmonary edema. No pleural effusion or pneumothorax.   ABDOMEN AND PELVIS:   LIVER:  Focal fatty infiltration along the falciform ligament. No mass. No intrahepatic  or extrahepatic biliary ductal dilation. The portal veins are patent.   GALLBLADDER AND BILE DUCTS:  Gallbladder is unremarkable. No biliary ductal dilatation.   SPLEEN:  Innumerable subcentimeter hypodensities throughout the spleen, which is not  enlarged.   PANCREAS:  Mild fatty infiltration of the pancreatic head and uncinate process. No  pancreatic ductal dilation.   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   KIDNEYS, URETERS AND BLADDER:  A few small subcentimeter hypodensities noted in both kidneys, too small to  definitively characterize, but likely small cysts. No stones in the kidneys or  ureters. No hydronephrosis. No perinephric or periureteral stranding. Urinary  bladder is unremarkable.   GI AND BOWEL:  Small amounts of ingested material layering in the stomach. There is no bowel  obstruction. Normal appendix. Asymmetric wall thickening of the proximal  transverse  colon with pericolonic inflammation (axial 69). This measures 4.9 cm  in length ( sagittal 71 ). Scattered colonic diverticulosis.   REPRODUCTIVE ORGANS:  Hysterectomy.   PERITONEUM AND RETROPERITONEUM:  No ascites. No free air.    VASCULATURE:  Diffuse aortoiliac atherosclerosis without hemodynamically significant  significant stenosis.   ABDOMINAL AND PELVIS LYMPH NODES:  No lymphadenopathy.   BONES AND SOFT TISSUES:  Multilevel degenerative disc disease throughout the thoracolumbar spine. Mild,  grade 1 anterolisthesis of L3 on L4, likely degenerative. No focal soft tissue  abnormality.   IMPRESSION:  1. Asymmetric wall thickening of the proximal transverse colon with pericolonic  inflammation, consistent with recent colon cancer diagnosis. Given the adjacent  inflammation, this is worrisome for early transmural extension. Follow up  colonoscopy recommended for confirmation.  2. Numerous subcentimeter hypodensities throughout the spleen possibly small  cyst or hemangiomas. Nonemergent multiphase abdominal MRI with iv contrast  recommended for further characterization to exclude underlying neoplasm or  metastatic disease.  3. No intraabdominal or pelvic lymphadenopathy. No distant metastatic disease  within the chest.  4. Scattered colonic diverticulosis without evidence of diverticulitis.   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  The risk of laparoscopic colon resection surgery includes, but not limited to, recurrence, bleeding, chronic pain, post-op infxn, post-op SBO or ileus, hernias, resection of bowel, re-anastamosis, possible ostomy placement and need for re-operation to address said risks. The risks of general anesthetic, if used, includes MI, CVA, sudden death or even reaction to anesthetic medications also discussed. Alternatives include continued observation.  Benefits include possible symptom relief, preventing further decline in health and possible death.   Typical post-op  recovery time of additional days in hospital for observation afterwards also discussed.   Prep ordered.  Will proceed with ERAS protocol as well.  Pending medical clearance and workup for spleen.   The patient verbalized understanding and all questions were answered to the patient's satisfaction.   labs/images/medications/previous chart entries reviewed personally and relevant changes/updates noted above.

## 2024-06-01 ENCOUNTER — Encounter: Payer: Self-pay | Admitting: Urgent Care

## 2024-06-04 ENCOUNTER — Inpatient Hospital Stay: Attending: Oncology | Admitting: Oncology

## 2024-06-04 ENCOUNTER — Ambulatory Visit: Payer: Self-pay | Admitting: Oncology

## 2024-06-04 ENCOUNTER — Inpatient Hospital Stay

## 2024-06-04 ENCOUNTER — Encounter: Payer: Self-pay | Admitting: Oncology

## 2024-06-04 VITALS — BP 134/66 | HR 84 | Temp 98.4°F | Ht 59.75 in | Wt 160.0 lb

## 2024-06-04 DIAGNOSIS — Z791 Long term (current) use of non-steroidal anti-inflammatories (NSAID): Secondary | ICD-10-CM | POA: Diagnosis not present

## 2024-06-04 DIAGNOSIS — C184 Malignant neoplasm of transverse colon: Secondary | ICD-10-CM

## 2024-06-04 DIAGNOSIS — N3281 Overactive bladder: Secondary | ICD-10-CM | POA: Diagnosis not present

## 2024-06-04 DIAGNOSIS — Z79890 Hormone replacement therapy (postmenopausal): Secondary | ICD-10-CM | POA: Diagnosis not present

## 2024-06-04 DIAGNOSIS — Z7984 Long term (current) use of oral hypoglycemic drugs: Secondary | ICD-10-CM | POA: Diagnosis not present

## 2024-06-04 DIAGNOSIS — E119 Type 2 diabetes mellitus without complications: Secondary | ICD-10-CM | POA: Diagnosis not present

## 2024-06-04 DIAGNOSIS — D509 Iron deficiency anemia, unspecified: Secondary | ICD-10-CM | POA: Diagnosis not present

## 2024-06-04 DIAGNOSIS — E876 Hypokalemia: Secondary | ICD-10-CM | POA: Diagnosis not present

## 2024-06-04 DIAGNOSIS — K5730 Diverticulosis of large intestine without perforation or abscess without bleeding: Secondary | ICD-10-CM | POA: Diagnosis not present

## 2024-06-04 DIAGNOSIS — Z882 Allergy status to sulfonamides status: Secondary | ICD-10-CM | POA: Diagnosis not present

## 2024-06-04 DIAGNOSIS — Z79899 Other long term (current) drug therapy: Secondary | ICD-10-CM | POA: Diagnosis not present

## 2024-06-04 DIAGNOSIS — R9389 Abnormal findings on diagnostic imaging of other specified body structures: Secondary | ICD-10-CM | POA: Diagnosis not present

## 2024-06-04 DIAGNOSIS — Z90710 Acquired absence of both cervix and uterus: Secondary | ICD-10-CM | POA: Diagnosis not present

## 2024-06-04 DIAGNOSIS — E039 Hypothyroidism, unspecified: Secondary | ICD-10-CM | POA: Insufficient documentation

## 2024-06-04 HISTORY — DX: Malignant neoplasm of transverse colon: C18.4

## 2024-06-04 HISTORY — DX: Hypokalemia: E87.6

## 2024-06-04 LAB — COMPREHENSIVE METABOLIC PANEL WITH GFR
ALT: 14 U/L (ref 0–44)
AST: 19 U/L (ref 15–41)
Albumin: 3.6 g/dL (ref 3.5–5.0)
Alkaline Phosphatase: 85 U/L (ref 38–126)
Anion gap: 13 (ref 5–15)
BUN: 16 mg/dL (ref 8–23)
CO2: 28 mmol/L (ref 22–32)
Calcium: 9.6 mg/dL (ref 8.9–10.3)
Chloride: 101 mmol/L (ref 98–111)
Creatinine, Ser: 0.86 mg/dL (ref 0.44–1.00)
GFR, Estimated: >60 mL/min (ref >60)
Glucose, Bld: 118 mg/dL — ABNORMAL HIGH (ref 70–99)
Potassium: 3 mmol/L — ABNORMAL LOW (ref 3.5–5.1)
Sodium: 142 mmol/L (ref 135–145)
Total Bilirubin: 0.4 mg/dL (ref 0.0–1.2)
Total Protein: 7.4 g/dL (ref 6.5–8.1)

## 2024-06-04 LAB — CBC WITH DIFFERENTIAL/PLATELET
Abs Immature Granulocytes: 0.03 K/uL (ref 0.00–0.07)
Basophils Absolute: 0 K/uL (ref 0.0–0.1)
Basophils Relative: 0 %
Eosinophils Absolute: 0.2 K/uL (ref 0.0–0.5)
Eosinophils Relative: 2 %
HCT: 35.7 % — ABNORMAL LOW (ref 36.0–46.0)
Hemoglobin: 11 g/dL — ABNORMAL LOW (ref 12.0–15.0)
Immature Granulocytes: 0 %
Lymphocytes Relative: 17 %
Lymphs Abs: 1.4 K/uL (ref 0.7–4.0)
MCH: 22.4 pg — ABNORMAL LOW (ref 26.0–34.0)
MCHC: 30.8 g/dL (ref 30.0–36.0)
MCV: 72.7 fL — ABNORMAL LOW (ref 80.0–100.0)
Monocytes Absolute: 0.5 K/uL (ref 0.1–1.0)
Monocytes Relative: 6 %
Neutro Abs: 6.2 K/uL (ref 1.7–7.7)
Neutrophils Relative %: 75 %
Platelets: 358 K/uL (ref 150–400)
RBC: 4.91 MIL/uL (ref 3.87–5.11)
RDW: 17.8 % — ABNORMAL HIGH (ref 11.5–15.5)
WBC: 8.3 K/uL (ref 4.0–10.5)
nRBC: 0 % (ref 0.0–0.2)

## 2024-06-04 MED ORDER — POTASSIUM CHLORIDE CRYS ER 20 MEQ PO TBCR: 20.0000 meq | EXTENDED_RELEASE_TABLET | Freq: Every day | ORAL | 0 refills | Status: AC

## 2024-06-04 NOTE — Assessment & Plan Note (Addendum)
 Imaging results and pathology results were reviewed and discussed with patient. She has established care with Dr. Tye. CT imaging results were reviewed.  Patient has numerous subcentimeter hypodensities throughout the spleen.  I recommend MRI abdomen with contrast for further recognization to exclude underlying neoplasm or metastatic disease. Discussed with patient that spleen metastasis is unusual however possible. Will obtain MRI for further categorization.  Discussed with Dr. Tye who will postpone her surgery which was originally scheduled on 8/15. Will check CEA, CBC CMP.

## 2024-06-04 NOTE — Progress Notes (Signed)
 Hematology/Oncology Consult Note [phone] [phone]     REFERRING PROVIDER: Watt Mirza, MD    CHIEF COMPLAINTS/PURPOSE OF CONSULTATION:  Transverse colon cancer  ASSESSMENT & PLAN:   Cancer of transverse colon [facility]) Imaging results and pathology results were reviewed and discussed with patient. She has established care with Dr. Tye. CT imaging results were reviewed.  Patient has numerous subcentimeter hypodensities throughout the spleen.  I recommend MRI abdomen with contrast for further recognization to exclude underlying neoplasm or metastatic disease. Discussed with patient that spleen metastasis is unusual however possible. Will obtain MRI for further categorization.  Discussed with Dr. Tye who will postpone her surgery which was originally scheduled on 8/15. Will check CEA, CBC CMP.   Microcytic anemia Likely due to iron deficiency anemia.  Check iron panel. Hemoglobin has improved likely secondary to recent intake of oral iron supplementation. We discussed of future IV Venofer treatments after surgery.   Orders Placed This Encounter  Procedures   MR Abdomen W Wo Contrast    ASAP    Standing Status:   Future    Expected Date:   06/11/2024    Expiration Date:   06/04/2025    If indicated for the ordered procedure, I authorize the administration of contrast media per Radiology protocol:   Yes    What is the patient's sedation requirement?:   No Sedation    Does the patient have a pacemaker or implanted devices?:   No    Preferred imaging location?:   [facility] (table limit - 500lbs)   CBC with Differential/Platelet    Standing Status:   Future    Number of Occurrences:   1    Expected Date:   06/04/2024    Expiration Date:   06/04/2025   Comprehensive metabolic panel with GFR    Standing Status:   Future    Number of Occurrences:   1    Expected Date:   06/04/2024    Expiration Date:   06/04/2025   CEA    Standing Status:    Future    Number of Occurrences:   1    Expiration Date:   06/04/2025   Iron and TIBC    Standing Status:   Future    Expected Date:   06/04/2024    Expiration Date:   09/02/2024   Ferritin    Standing Status:   Future    Expected Date:   06/04/2024    Expiration Date:   09/02/2024   Retic Panel    Standing Status:   Future    Expected Date:   06/04/2024    Expiration Date:   06/04/2025   Follow-up to be determined. All questions were answered. The patient knows to call the clinic with any problems, questions or concerns.  [name], MD, PhD [facility] Hematology Oncology 06/04/2024    HISTORY OF PRESENTING ILLNESS:  Robin Bass 73 y.o. female presents to establish care for transverse colon cancer. I have reviewed her chart and materials related to her cancer extensively and collaborated history with the patient. Summary of oncologic history is as follows: Oncology History  Cancer of transverse colon (HCC)  05/28/2024 Imaging   CT chest abdomen pelvis with contrast showed  1. Asymmetric wall thickening of the proximal transverse colon with pericolonic inflammation, consistent with recent colon cancer diagnosis. Given the adjacent inflammation, this is worrisome for early transmural extension. Follow up colonoscopy recommended for confirmation. 2. Numerous subcentimeter hypodensities throughout the spleen  possibly small cyst or hemangiomas. Nonemergent multiphase abdominal MRI with iv contrast recommended for further characterization to exclude underlying neoplasm or metastatic disease. 3. No intraabdominal or pelvic lymphadenopathy. No distant metastatic disease within the chest. 4. Scattered colonic diverticulosis without evidence of diverticulitis   06/04/2024 Initial Diagnosis   Cancer of transverse colon  Patient was found to have New onset of microcytic anemia. Subsequent workup including Cologuard was positive. 05/31/2024, colonoscopy showed 270 pedunculated polyps in  the sigmoid colon.  Resected and retrieved.  Infiltrative sessile and ulcerated partially obstructing large mass in the proximal transverse colon.  Mass was partially circumferential, no bleeding was present.  Mass was biopsied.  Pathology showed - Proximal transverse colon mass-ulcerated moderately differentiated adenocarcinoma. - Sigmoid polyps -tubular adenoma.  Negative for high-grade dysplasia and malignancy.  Pathology showed      06/04/2024 Cancer Staging   Staging form: Colon and Rectum, AJCC 8th Edition - Clinical stage from 06/04/2024: Stage Unknown (cTX, cN0, cM0) - Signed by Babara Call, MD on 06/04/2024 Stage prefix: Initial diagnosis Total positive nodes: 0   Patient was accompanied by daughter to establish care. She has tried oral iron supplementation which causes her to have constipation.  Currently she is off iron supplementation due to upcoming surgery.  She has established care with Dr. Tye and there is upcoming surgery scheduled on 06/07/2024. Denies any rectal bleeding, abdominal pain, nausea or vomiting. Family history positive for father as cancer.  MEDICAL HISTORY:  Past Medical History:  Diagnosis Date   Asthma    Basal cell carcinoma    skin   COPD (chronic obstructive pulmonary disease) (HCC)    Diabetes mellitus type 2, controlled, without complications (HCC) 08/21/2014   GERD (gastroesophageal reflux disease)    History of pituitary tumor    Hypothyroidism    Overactive bladder     SURGICAL HISTORY: Past Surgical History:  Procedure Laterality Date   ABDOMINAL HYSTERECTOMY      SOCIAL HISTORY: Social History   Socioeconomic History   Marital status: Married    Spouse name: Not on file   Number of children: 1   Years of education: Not on file   Highest education level: Not on file  Occupational History   Occupation: Producer, television/film/video: UNEMPLOYED  Tobacco Use   Smoking status: Never   Smokeless tobacco: Never  Vaping Use   Vaping  status: Never Used  Substance and Sexual Activity   Alcohol use: No   Drug use: No   Sexual activity: Not on file  Other Topics Concern   Not on file  Social History Narrative   Not on file   Social Drivers of Health   Financial Resource Strain: Low Risk  (05/28/2024)   Received from [facility]   Overall Financial Resource Strain (CARDIA)    Difficulty of Paying Living Expenses: Not hard at all  Food Insecurity: No Food Insecurity (06/04/2024)   Hunger Vital Sign    Worried About Running Out of Food in the Last Year: Never true    Ran Out of Food in the Last Year: Never true  Transportation Needs: No Transportation Needs (06/04/2024)   PRAPARE - Administrator, Civil Service (Medical): No    Lack of Transportation (Non-Medical): No  Physical Activity: Inactive (02/27/2024)   Exercise Vital Sign    Days of Exercise per Week: 0 days    Minutes of Exercise per Session: 0 min  Stress: Stress Concern Present (02/27/2024)  [employer] of Occupational Health - Occupational Stress Questionnaire    Feeling of Stress : To some extent  Social Connections: Moderately Isolated (02/27/2024)   Social Connection and Isolation Panel    Frequency of Communication with Friends and Family: More than three times a week    Frequency of Social Gatherings with Friends and Family: More than three times a week    Attends Religious Services: Never    Database administrator or Organizations: No    Attends Banker Meetings: Never    Marital Status: Married  Catering manager Violence: Not At Risk (06/04/2024)   Humiliation, Afraid, Rape, and Kick questionnaire    Fear of Current or Ex-Partner: No    Emotionally Abused: No    Physically Abused: No    Sexually Abused: No    FAMILY HISTORY: Family History  Problem Relation Age of Onset   Hypertension Mother    Heart attack Father    Basal cell carcinoma Father    Cancer Father    Breast cancer Neg Hx      ALLERGIES:  is allergic to sulfonamide derivatives and ace inhibitors.  MEDICATIONS:  Current Outpatient Medications  Medication Sig Dispense Refill   amLODipine  (NORVASC ) 10 MG tablet TAKE ONE TABLET BY MOUTH ONCE A DAY (Patient taking differently: Take 10 mg by mouth at bedtime.) 90 tablet 0   atorvastatin  (LIPITOR) 20 MG tablet TAKE ONE TABLET BY MOUTH ONCE DAILY 100 tablet 2   Cholecalciferol (VITAMIN D ) 50 MCG (2000 UT) CAPS Take 2,000 Units by mouth daily.     clonazePAM  (KLONOPIN ) 0.5 MG tablet TAKE ONE TABLET (0.5 MG TOTAL) BY MOUTH TWO TIMES DAILY AS NEEDED FOR ANXIETY. 30 tablet 1   FLUoxetine  (PROZAC ) 20 MG capsule TAKE ONE CAPSULE BY MOUTH ONCE DAILY 90 capsule 1   levothyroxine  (SYNTHROID ) 50 MCG tablet TAKE ONE TAB BY MOUTH ONCE DAILY. TAKE ON AN EMPTY STOMACH WITH A GLASS OF WATER ATLEAST 30-60 MINUTES BEFORE BREAKFAST 90 tablet 3   losartan -hydrochlorothiazide  (HYZAAR) 100-25 MG tablet TAKE ONE TABLET BY MOUTH ONCE DAILY 90 tablet 0   metFORMIN  (GLUCOPHAGE -XR) 500 MG 24 hr tablet TAKE ONE TABLET (500 MG TOTAL) BY MOUTH DAILY WITH BREAKFAST. 90 tablet 1   ONE TOUCH ULTRA TEST test strip USE TO CHECK BLOOD SUGAR TWICE A DAY AND AS DIRECTED 100 each 6   ONETOUCH DELICA LANCETS 33G MISC Use to check blood sugar two times a day 100 each 11   albuterol  (VENTOLIN  HFA) 108 (90 Base) MCG/ACT inhaler Inhale 2 puffs into the lungs every 4 (four) hours as needed for wheezing. (Patient not taking: Reported on 06/04/2024) 1 each 0   clindamycin (CLEOCIN T) 1 % lotion Apply 1 Application topically daily as needed (irritation). (Patient not taking: Reported on 06/04/2024)     meloxicam  (MOBIC ) 15 MG tablet Take 1 tablet (15 mg total) by mouth daily. (Patient taking differently: Take 15 mg by mouth daily as needed for pain.) 30 tablet 3   No current facility-administered medications for this visit.    Review of Systems  Constitutional:  Positive for fatigue. Negative for appetite change,  chills and fever.  HENT:   Negative for hearing loss and voice change.   Eyes:  Negative for eye problems.  Respiratory:  Negative for chest tightness and cough.   Cardiovascular:  Negative for chest pain.  Gastrointestinal:  Negative for abdominal distention, abdominal pain and blood in stool.  Endocrine: Negative for hot flashes.  Genitourinary:  Negative for difficulty urinating and frequency.   Musculoskeletal:  Negative for arthralgias.  Skin:  Negative for itching and rash.  Neurological:  Negative for extremity weakness.  Hematological:  Negative for adenopathy.  Psychiatric/Behavioral:  Negative for confusion.      PHYSICAL EXAMINATION: ECOG PERFORMANCE STATUS: 1 - Symptomatic but completely ambulatory  Vitals:   06/04/24 1527  BP: 134/66  Pulse: 84  Temp: 98.4 F (36.9 C)  SpO2: 96%   Filed Weights   06/04/24 1527  Weight: 160 lb (72.6 kg)    Physical Exam Constitutional:      General: She is not in acute distress.    Appearance: She is not diaphoretic.  HENT:     Head: Normocephalic and atraumatic.     Mouth/Throat:     Pharynx: No oropharyngeal exudate.  Eyes:     General: No scleral icterus.    Pupils: Pupils are equal, round, and reactive to light.  Cardiovascular:     Rate and Rhythm: Normal rate and regular rhythm.     Heart sounds: No murmur heard. Pulmonary:     Effort: Pulmonary effort is normal. No respiratory distress.     Breath sounds: No rales.  Chest:     Chest wall: No tenderness.  Abdominal:     General: There is no distension.     Palpations: Abdomen is soft.     Tenderness: There is no abdominal tenderness.  Musculoskeletal:        General: Normal range of motion.     Cervical back: Normal range of motion and neck supple.  Skin:    General: Skin is warm and dry.     Findings: No erythema.  Neurological:     Mental Status: She is alert and oriented to person, place, and time.     Cranial Nerves: No cranial nerve deficit.      Motor: No abnormal muscle tone.     Coordination: Coordination normal.  Psychiatric:        Mood and Affect: Affect normal.      LABORATORY DATA:  I have reviewed the data as listed    Latest Ref Rng & Units 06/04/2024    4:10 PM 03/28/2024    7:41 AM 03/16/2023    8:06 AM  CBC  WBC 4.0 - 10.5 K/uL 8.3  8.1  8.3   Hemoglobin 12.0 - 15.0 g/dL 88.9  9.8  87.8   Hematocrit 36.0 - 46.0 % 35.7  31.1  37.5   Platelets 150 - 400 K/uL 358  522.0  393.0       Latest Ref Rng & Units 06/04/2024    4:10 PM 05/28/2024   10:48 AM 03/28/2024    7:41 AM  CMP  Glucose 70 - 99 mg/dL 881   852   BUN 8 - 23 mg/dL 16   22   Creatinine 9.55 - 1.00 mg/dL 9.13  8.99  9.09   Sodium 135 - 145 mmol/L 142   141   Potassium 3.5 - 5.1 mmol/L 3.0   4.0   Chloride 98 - 111 mmol/L 101   102   CO2 22 - 32 mmol/L 28   26   Calcium  8.9 - 10.3 mg/dL 9.6   89.7   Total Protein 6.5 - 8.1 g/dL 7.4   7.1   Total Bilirubin 0.0 - 1.2 mg/dL 0.4   0.4   Alkaline Phos 38 - 126 U/L 85   112   AST 15 -  41 U/L 19   12   ALT 0 - 44 U/L 14   10      RADIOGRAPHIC STUDIES: I have personally reviewed the radiological images as listed and agreed with the findings in the report. CT CHEST ABDOMEN PELVIS W CONTRAST Result Date: 05/28/2024 EXAM: CT CHEST, ABDOMEN AND PELVIS WITH CONTRAST 05/28/2024 11:12:28 AM TECHNIQUE: CT of the chest, abdomen and pelvis was performed with the administration of intravenous contrast. Multiplanar reformatted images are provided for review. Automated exposure control, iterative reconstruction, and/or weight based adjustment of the mA/kV was utilized to reduce the radiation dose to as low as reasonably achievable. COMPARISON: 10/31/2023 CLINICAL HISTORY: Recent colon cancer diagnosis. FINDINGS: CHEST: MEDIASTINUM: Heart and pericardium are unremarkable. The central airways are clear. THORACIC LYMPH NODES: No mediastinal, hilar or axillary lymphadenopathy. LUNGS AND PLEURA: No focal consolidation or pulmonary  edema. No pleural effusion or pneumothorax. ABDOMEN AND PELVIS: LIVER: Focal fatty infiltration along the falciform ligament. No mass. No intrahepatic or extrahepatic biliary ductal dilation. The portal veins are patent. GALLBLADDER AND BILE DUCTS: Gallbladder is unremarkable. No biliary ductal dilatation. SPLEEN: Innumerable subcentimeter hypodensities throughout the spleen, which is not enlarged. PANCREAS: Mild fatty infiltration of the pancreatic head and uncinate process. No pancreatic ductal dilation. 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 KIDNEYS, URETERS AND BLADDER: A few small subcentimeter hypodensities noted in both kidneys, too small to definitively characterize, but likely small cysts. No stones in the kidneys or ureters. No hydronephrosis. No perinephric or periureteral stranding. Urinary bladder is unremarkable. GI AND BOWEL: Small amounts of ingested material layering in the stomach. There is no bowel obstruction. Normal appendix. Asymmetric wall thickening of the proximal transverse colon with pericolonic inflammation (axial 69). This measures 4.9 cm in length ( sagittal 71 ). Scattered colonic diverticulosis. REPRODUCTIVE ORGANS: Hysterectomy. PERITONEUM AND RETROPERITONEUM: No ascites. No free air. VASCULATURE: Diffuse aortoiliac atherosclerosis without hemodynamically significant significant stenosis. ABDOMINAL AND PELVIS LYMPH NODES: No lymphadenopathy. BONES AND SOFT TISSUES: Multilevel degenerative disc disease throughout the thoracolumbar spine. Mild, grade 1 anterolisthesis of L3 on L4, likely degenerative. No focal soft tissue abnormality. IMPRESSION: 1. Asymmetric wall thickening of the proximal transverse colon with pericolonic inflammation, consistent with recent colon cancer diagnosis. Given the adjacent inflammation, this is worrisome for early transmural extension. Follow up colonoscopy recommended for confirmation. 2. Numerous subcentimeter hypodensities  throughout the spleen possibly small cyst or hemangiomas. Nonemergent multiphase abdominal MRI with iv contrast recommended for further characterization to exclude underlying neoplasm or metastatic disease. 3. No intraabdominal or pelvic lymphadenopathy. No distant metastatic disease within the chest. 4. Scattered colonic diverticulosis without evidence of diverticulitis. Electronically signed by: Rogelia Myers MD 05/28/2024 12:30 PM EDT RP Workstation: [id number]

## 2024-06-04 NOTE — Addendum Note (Signed)
 Addended by: BABARA CALL on: 06/04/2024 10:49 PM   Modules accepted: Orders

## 2024-06-04 NOTE — Assessment & Plan Note (Signed)
 Recommend potassium chloride  20 mEq daily for 3 days.

## 2024-06-04 NOTE — Assessment & Plan Note (Signed)
 Likely due to iron deficiency anemia.  Check iron panel. Hemoglobin has improved likely secondary to recent intake of oral iron supplementation. We discussed of future IV Venofer treatments after surgery.

## 2024-06-05 ENCOUNTER — Encounter
Admission: RE | Admit: 2024-06-05 | Discharge: 2024-06-05 | Disposition: A | Discharge status: Home / Self Care | Source: Ambulatory Visit | Attending: Surgery | Admitting: Surgery

## 2024-06-05 ENCOUNTER — Other Ambulatory Visit: Payer: Self-pay

## 2024-06-05 ENCOUNTER — Telehealth: Payer: Self-pay | Admitting: Oncology

## 2024-06-05 DIAGNOSIS — C184 Malignant neoplasm of transverse colon: Secondary | ICD-10-CM

## 2024-06-05 DIAGNOSIS — D509 Iron deficiency anemia, unspecified: Secondary | ICD-10-CM

## 2024-06-05 HISTORY — DX: Anemia, unspecified: D64.9

## 2024-06-05 HISTORY — DX: Depression, unspecified: F32.A

## 2024-06-05 LAB — RETIC PANEL
Immature Retic Fract: 13.1 % (ref 2.3–15.9)
RBC.: 4.84 MIL/uL (ref 3.87–5.11)
Retic Count, Absolute: 57.6 K/uL (ref 19.0–186.0)
Retic Ct Pct: 1.2 % (ref 0.4–3.1)
Reticulocyte Hemoglobin: 25.1 pg — ABNORMAL LOW (ref >27.9)

## 2024-06-05 LAB — IRON AND TIBC
Iron: 26 ug/dL — ABNORMAL LOW (ref 28–170)
Saturation Ratios: 7 % — ABNORMAL LOW (ref 10.4–31.8)
TIBC: 378 ug/dL (ref 250–450)
UIBC: 352 ug/dL

## 2024-06-05 LAB — FERRITIN: Ferritin: 19 ng/mL (ref 11–307)

## 2024-06-05 LAB — CEA: CEA: 6.5 ng/mL — ABNORMAL HIGH (ref 0.0–4.7)

## 2024-06-05 NOTE — Telephone Encounter (Signed)
 Pt is aware that she will be scheduled for an MRI per MD.  MRI is scheduled and I left a vm for pt about date/time and their number if she needed to r/s appt

## 2024-06-05 NOTE — Pre-Procedure Instructions (Signed)
 Patient has stated that the surgery has been cancelled until further notice.

## 2024-06-05 NOTE — Progress Notes (Signed)
 Spoke to pt and informed her that rx for potassium has been sent to pharmacy and reviewed administration instructions. Pt verbalized understanding.

## 2024-06-05 NOTE — Patient Instructions (Addendum)
 Your procedure is scheduled on:  FRIDAY AUGUST 15  Report to the Registration Desk on the 1st floor of the [facility] To find out your arrival time, please call [phone] between 1PM - 3PM on:   THURSDAY AUGUST 14  If your arrival time is 6:00 am, do not arrive before that time as the [facility] entrance doors do not open until 6:00 am.  REMEMBER: Instructions that are not followed completely may result in serious medical risk, up to and including death; or upon the discretion of your surgeon and anesthesiologist your surgery may need to be rescheduled.  Do not eat food after midnight the night before surgery.  No gum chewing or hard candies.  You may however, drink Water up to 2 hours before you are scheduled to arrive for your surgery. Do not drink anything within 2 hours of your scheduled arrival time.  One week prior to surgery:    Stop Anti-inflammatories (NSAIDS) such as Advil, Aleve, Ibuprofen, Motrin, Naproxen, Naprosyn and Aspirin based products such as Excedrin, Goody's Powder, BC Powder. Stop ANY OVER THE COUNTER supplements until after surgery. Cholecalciferol (VITAMIN D )   You may however, continue to take Tylenol if needed for pain up until the day of surgery.  **Follow guidelines for insulin and diabetes medications.** metFORMIN  (GLUCOPHAGE -XR) hold 2 days prior to surgery, last dose TUESDAY AUGUST 12    meloxicam  (MOBIC ) hold until after your surgery  Continue taking all of your other prescription medications up until the day of surgery.  ON THE DAY OF SURGERY ONLY TAKE THESE MEDICATIONS WITH SIPS OF WATER:  FLUoxetine  (PROZAC )  levothyroxine  (SYNTHROID )   Use inhalers on the day of surgery and bring to the hospital. albuterol  (VENTOLIN  HFA)   No Alcohol for 24 hours before or after surgery.  No Smoking including e-cigarettes for 24 hours before surgery.  No chewable tobacco products for at least 6 hours before surgery.  No nicotine patches on the day  of surgery.  Do not use any recreational drugs for at least a week (preferably 2 weeks) before your surgery.  Please be advised that the combination of cocaine and anesthesia may have negative outcomes, up to and including death. If you test positive for cocaine, your surgery will be cancelled.  On the morning of surgery brush your teeth with toothpaste and water, you may rinse your mouth with mouthwash if you wish. Do not swallow any toothpaste or mouthwash.  Use CHG Soap as directed on instruction sheet.  Do not wear jewelry, make-up, hairpins, clips or nail polish.  For welded (permanent) jewelry: bracelets, anklets, waist bands, etc.  Please have this removed prior to surgery.  If it is not removed, there is a chance that hospital personnel will need to cut it off on the day of surgery.  Do not wear lotions, powders, or perfumes.   Do not shave body hair from the neck down 48 hours before surgery.  Contact lenses, hearing aids and dentures may not be worn into surgery.  Do not bring valuables to the hospital. [facility] is not responsible for any missing/lost belongings or valuables.   Notify your doctor if there is any change in your medical condition (cold, fever, infection).  Wear comfortable clothing (specific to your surgery type) to the hospital.  After surgery, you can help prevent lung complications by doing breathing exercises.  Take deep breaths and cough every 1-2 hours.   If you are being admitted to the hospital overnight, leave  your suitcase in the car. After surgery it may be brought to your room.  In case of increased patient census, it may be necessary for you, the patient, to continue your postoperative care in the Same Day Surgery department.  If you are being discharged the day of surgery, you will not be allowed to drive home. You will need a responsible individual to drive you home and stay with you for 24 hours after surgery.   If you are taking  public transportation, you will need to have a responsible individual with you.  Please call the Pre-admissions Testing Dept. at [phone] if you have any questions about these instructions.  Surgery Visitation Policy:  Patients having surgery or a procedure may have two visitors.  Children under the age of 30 must have an adult with them who is not the patient.  Inpatient Visitation:    Visiting hours are 7 a.m. to 8 p.m. Up to four visitors are allowed at one time in a patient room. The visitors may rotate out with other people during the day.  One visitor age 47 or older may stay with the patient overnight and must be in the room by 8 p.m.   Merchandiser, retail to address health-related social needs:  [email or web address]       Preparing for Surgery with CHLORHEXIDINE GLUCONATE (CHG) Soap  Chlorhexidine Gluconate (CHG) Soap  o An antiseptic cleaner that kills germs and bonds with the skin to continue killing germs even after washing  o Used for showering the night before surgery and morning of surgery  Before surgery, you can play an important role by reducing the number of germs on your skin.  CHG (Chlorhexidine gluconate) soap is an antiseptic cleanser which kills germs and bonds with the skin to continue killing germs even after washing.  Please do not use if you have an allergy to CHG or antibacterial soaps. If your skin becomes reddened/irritated stop using the CHG.  1. Shower the NIGHT BEFORE SURGERY and the MORNING OF SURGERY with CHG soap.  2. If you choose to wash your hair, wash your hair first as usual with your normal shampoo.  3. After shampooing, rinse your hair and body thoroughly to remove the shampoo.  4. Use CHG as you would any other liquid soap. You can apply CHG directly to the skin and wash gently with a scrungie or a clean washcloth.  5. Apply the CHG soap to your body only from the neck down. Do not use on open wounds or  open sores. Avoid contact with your eyes, ears, mouth, and genitals (private parts). Wash face and genitals (private parts) with your normal soap.  6. Wash thoroughly, paying special attention to the area where your surgery will be performed.  7. Thoroughly rinse your body with warm water.  8. Do not shower/wash with your normal soap after using and rinsing off the CHG soap.  9. Pat yourself dry with a clean towel.  10. Wear clean pajamas to bed the night before surgery.  11. Place clean sheets on your bed the night of your first shower and do not sleep with pets.  12. Shower again with the CHG soap on the day of surgery prior to arriving at the hospital.  13. Do not apply any deodorants/lotions/powders.  14. Please wear clean clothes to the hospital.

## 2024-06-06 ENCOUNTER — Other Ambulatory Visit: Payer: Self-pay | Admitting: Oncology

## 2024-06-07 ENCOUNTER — Encounter: Admission: RE | Payer: Self-pay | Source: Home / Self Care

## 2024-06-07 ENCOUNTER — Other Ambulatory Visit: Payer: Self-pay | Admitting: Family Medicine

## 2024-06-07 ENCOUNTER — Inpatient Hospital Stay: Admission: RE | Admit: 2024-06-07 | Source: Home / Self Care | Admitting: Surgery

## 2024-06-07 SURGERY — COLECTOMY, PARTIAL, ROBOT-ASSISTED, LAPAROSCOPIC
Anesthesia: General | Site: Abdomen | Laterality: Right

## 2024-06-09 ENCOUNTER — Ambulatory Visit
Admission: RE | Admit: 2024-06-09 | Discharge: 2024-06-09 | Disposition: A | Discharge status: Home / Self Care | Source: Ambulatory Visit | Attending: Oncology

## 2024-06-09 DIAGNOSIS — C184 Malignant neoplasm of transverse colon: Secondary | ICD-10-CM | POA: Insufficient documentation

## 2024-06-09 DIAGNOSIS — M47816 Spondylosis without myelopathy or radiculopathy, lumbar region: Secondary | ICD-10-CM | POA: Diagnosis not present

## 2024-06-09 DIAGNOSIS — R9389 Abnormal findings on diagnostic imaging of other specified body structures: Secondary | ICD-10-CM | POA: Diagnosis not present

## 2024-06-09 DIAGNOSIS — R932 Abnormal findings on diagnostic imaging of liver and biliary tract: Secondary | ICD-10-CM | POA: Diagnosis not present

## 2024-06-09 DIAGNOSIS — C189 Malignant neoplasm of colon, unspecified: Secondary | ICD-10-CM | POA: Diagnosis not present

## 2024-06-09 DIAGNOSIS — R935 Abnormal findings on diagnostic imaging of other abdominal regions, including retroperitoneum: Secondary | ICD-10-CM | POA: Diagnosis not present

## 2024-06-09 MED ORDER — GADOBUTROL 1 MMOL/ML IV SOLN
7.0000 mL | Freq: Once | INTRAVENOUS | Status: AC | PRN
  Administered 2024-06-09: 7 mL via INTRAVENOUS

## 2024-06-13 ENCOUNTER — Encounter
Admission: RE | Admit: 2024-06-13 | Discharge: 2024-06-13 | Disposition: A | Discharge status: Home / Self Care | Source: Ambulatory Visit | Attending: Surgery | Admitting: Surgery

## 2024-06-13 ENCOUNTER — Other Ambulatory Visit: Payer: Self-pay

## 2024-06-13 VITALS — BP 131/58 | HR 80 | Temp 98.6°F | Resp 16 | Ht 59.0 in | Wt 161.4 lb

## 2024-06-13 DIAGNOSIS — E785 Hyperlipidemia, unspecified: Secondary | ICD-10-CM | POA: Insufficient documentation

## 2024-06-13 DIAGNOSIS — C184 Malignant neoplasm of transverse colon: Secondary | ICD-10-CM | POA: Insufficient documentation

## 2024-06-13 DIAGNOSIS — E119 Type 2 diabetes mellitus without complications: Secondary | ICD-10-CM | POA: Insufficient documentation

## 2024-06-13 DIAGNOSIS — R9431 Abnormal electrocardiogram [ECG] [EKG]: Secondary | ICD-10-CM | POA: Insufficient documentation

## 2024-06-13 DIAGNOSIS — I10 Essential (primary) hypertension: Secondary | ICD-10-CM | POA: Diagnosis not present

## 2024-06-13 DIAGNOSIS — Z01818 Encounter for other preprocedural examination: Secondary | ICD-10-CM | POA: Diagnosis not present

## 2024-06-13 DIAGNOSIS — Z01812 Encounter for preprocedural laboratory examination: Secondary | ICD-10-CM

## 2024-06-13 HISTORY — DX: Anxiety disorder, unspecified: F41.9

## 2024-06-13 LAB — TYPE AND SCREEN
ABO/RH(D): O POS
Antibody Screen: NEGATIVE

## 2024-06-13 LAB — BASIC METABOLIC PANEL WITH GFR
Anion gap: 13 (ref 5–15)
BUN: 20 mg/dL (ref 8–23)
CO2: 26 mmol/L (ref 22–32)
Calcium: 10.5 mg/dL — ABNORMAL HIGH (ref 8.9–10.3)
Chloride: 102 mmol/L (ref 98–111)
Creatinine, Ser: 0.81 mg/dL (ref 0.44–1.00)
GFR, Estimated: >60 mL/min (ref >60)
Glucose, Bld: 144 mg/dL — ABNORMAL HIGH (ref 70–99)
Potassium: 3.5 mmol/L (ref 3.5–5.1)
Sodium: 141 mmol/L (ref 135–145)

## 2024-06-13 LAB — CBC
HCT: 34.7 % — ABNORMAL LOW (ref 36.0–46.0)
Hemoglobin: 10.7 g/dL — ABNORMAL LOW (ref 12.0–15.0)
MCH: 22.9 pg — ABNORMAL LOW (ref 26.0–34.0)
MCHC: 30.8 g/dL (ref 30.0–36.0)
MCV: 74.1 fL — ABNORMAL LOW (ref 80.0–100.0)
Platelets: 419 K/uL — ABNORMAL HIGH (ref 150–400)
RBC: 4.68 MIL/uL (ref 3.87–5.11)
RDW: 17.6 % — ABNORMAL HIGH (ref 11.5–15.5)
WBC: 7.5 K/uL (ref 4.0–10.5)
nRBC: 0 % (ref 0.0–0.2)

## 2024-06-13 NOTE — Patient Instructions (Addendum)
 Your procedure is scheduled on: Monday 06/17/24  Report to the Registration Desk on the 1st floor of the [facility]. To find out your arrival time, please call [phone] between 1PM - 3PM on: Friday 06/14/24 If your arrival time is 6:00 am, do not arrive before that time as the [facility] Mall entrance doors do not open until 6:00 am.  REMEMBER: Instructions that are not followed completely may result in serious medical risk, up to and including death; or upon the discretion of your surgeon and anesthesiologist your surgery may need to be rescheduled.  Do not eat food after midnight the night before surgery. Follow surgeon's instructions for prep and take his medications as instructed.  No gum chewing or hard candies.  You may however, drink CLEAR liquids up to 2 hours before you are scheduled to arrive for your surgery. Do not drink anything within 2 hours of your scheduled arrival time.  Clear liquids include: - water    One week prior to surgery: Stop Anti-inflammatories (NSAIDS) such as Advil, Aleve, Ibuprofen, meloxicam  (MOBIC ), Motrin, Naproxen, Naprosyn and Aspirin based products such as Excedrin, Goody's Powder, BC Powder. Stop ANY OVER THE COUNTER supplements until after surgery.  You may however, continue to take Tylenol if needed for pain up until the day of surgery.  Stop metFORMIN  (GLUCOPHAGE -XR) 500 MG 2 days prior to surgery (take last dose Friday 06/14/24)  Continue taking all of your other prescription medications up until the day of surgery.  ON THE DAY OF SURGERY ONLY TAKE THESE MEDICATIONS WITH SIPS OF WATER:  levothyroxine  (SYNTHROID ) 50 MCG  FLUoxetine  (PROZAC ) 20 MG   Use your inhaler the morning of your procedure and bring inhaler with you to the hospital.  Not using inhaler and maybe expired. Bring if still in date.   No Alcohol for 24 hours before or after surgery.  No Smoking including e-cigarettes for 24 hours before surgery.  No chewable tobacco  products for at least 6 hours before surgery.  No nicotine patches on the day of surgery.  Do not use any recreational drugs for at least a week (preferably 2 weeks) before your surgery.  Please be advised that the combination of cocaine and anesthesia may have negative outcomes, up to and including death. If you test positive for cocaine, your surgery will be cancelled.  On the morning of surgery brush your teeth with toothpaste and water, you may rinse your mouth with mouthwash if you wish. Do not swallow any toothpaste or mouthwash.  Use CHG Soap or wipes as directed on instruction sheet.  Do not wear jewelry, make-up, hairpins, clips or nail polish.  For welded (permanent) jewelry: bracelets, anklets, waist bands, etc.  Please have this removed prior to surgery.  If it is not removed, there is a chance that hospital personnel will need to cut it off on the day of surgery.  Do not wear lotions, powders, or perfumes.   Do not shave body hair from the neck down 48 hours before surgery.  Contact lenses, hearing aids and dentures may not be worn into surgery.  Do not bring valuables to the hospital. [facility] is not responsible for any missing/lost belongings or valuables.   Notify your doctor if there is any change in your medical condition (cold, fever, infection).  Wear comfortable clothing (specific to your surgery type) to the hospital.  After surgery, you can help prevent lung complications by doing breathing exercises.  Take deep breaths and cough every 1-2  hours. Your doctor may order a device called an Incentive Spirometer to help you take deep breaths. When coughing or sneezing, hold a pillow firmly against your incision with both hands. This is called "splinting." Doing this helps protect your incision. It also decreases belly discomfort.  If you are being admitted to the hospital overnight, leave your suitcase in the car. After surgery it may be brought to your  room.  In case of increased patient census, it may be necessary for you, the patient, to continue your postoperative care in the Same Day Surgery department.  If you are being discharged the day of surgery, you will not be allowed to drive home. You will need a responsible individual to drive you home and stay with you for 24 hours after surgery.   If you are taking public transportation, you will need to have a responsible individual with you.  Please call the Pre-admissions Testing Dept. at [phone] if you have any questions about these instructions.  Surgery Visitation Policy:  Patients having surgery or a procedure may have two visitors.  Children under the age of 43 must have an adult with them who is not the patient.  Inpatient Visitation:    Visiting hours are 7 a.m. to 8 p.m. Up to four visitors are allowed at one time in a patient room. The visitors may rotate out with other people during the day.  One visitor age 61 or older may stay with the patient overnight and must be in the room by 8 p.m.   Merchandiser, retail to address health-related social needs:  [email or web address]     Preparing for Surgery with CHLORHEXIDINE GLUCONATE (CHG) Soap  Chlorhexidine Gluconate (CHG) Soap  o An antiseptic cleaner that kills germs and bonds with the skin to continue killing germs even after washing  o Used for showering the night before surgery and morning of surgery  Before surgery, you can play an important role by reducing the number of germs on your skin.  CHG (Chlorhexidine gluconate) soap is an antiseptic cleanser which kills germs and bonds with the skin to continue killing germs even after washing.  Please do not use if you have an allergy to CHG or antibacterial soaps. If your skin becomes reddened/irritated stop using the CHG.  1. Shower the NIGHT BEFORE SURGERY and the MORNING OF SURGERY with CHG soap.  2. If you choose to wash your hair,  wash your hair first as usual with your normal shampoo.  3. After shampooing, rinse your hair and body thoroughly to remove the shampoo.  4. Use CHG as you would any other liquid soap. You can apply CHG directly to the skin and wash gently with a scrungie or a clean washcloth.  5. Apply the CHG soap to your body only from the neck down. Do not use on open wounds or open sores. Avoid contact with your eyes, ears, mouth, and genitals (private parts). Wash face and genitals (private parts) with your normal soap.  6. Wash thoroughly, paying special attention to the area where your surgery will be performed.  7. Thoroughly rinse your body with warm water.  8. Do not shower/wash with your normal soap after using and rinsing off the CHG soap.  9. Pat yourself dry with a clean towel.  10. Wear clean pajamas to bed the night before surgery.  12. Place clean sheets on your bed the night of your first shower and do not sleep with pets.  13. Shower again  with the CHG soap on the day of surgery prior to arriving at the hospital.  14. Do not apply any deodorants/lotions/powders.  15. Please wear clean clothes to the hospital.  How to Use an Incentive Spirometer  An incentive spirometer is a tool that measures how well you are filling your lungs with each breath. Learning to take long, deep breaths using this tool can help you keep your lungs clear and active. This may help to reverse or lessen your chance of developing breathing (pulmonary) problems, especially infection. You may be asked to use a spirometer: After a surgery. If you have a lung problem or a history of smoking. After a long period of time when you have been unable to move or be active. If the spirometer includes an indicator to show the highest number that you have reached, your health care provider or respiratory therapist will help you set a goal. Keep a log of your progress as told by your health care provider. What are the  risks? Breathing too quickly may cause dizziness or cause you to pass out. Take your time so you do not get dizzy or light-headed. If you are in pain, you may need to take pain medicine before doing incentive spirometry. It is harder to take a deep breath if you are having pain. How to use your incentive spirometer  Sit up on the edge of your bed or on a chair. Hold the incentive spirometer so that it is in an upright position. Before you use the spirometer, breathe out normally. Place the mouthpiece in your mouth. Make sure your lips are closed tightly around it. Breathe in slowly and as deeply as you can through your mouth, causing the piston or the ball to rise toward the top of the chamber. Hold your breath for 3-5 seconds, or for as long as possible. If the spirometer includes a coach indicator, use this to guide you in breathing. Slow down your breathing if the indicator goes above the marked areas. Remove the mouthpiece from your mouth and breathe out normally. The piston or ball will return to the bottom of the chamber. Rest for a few seconds, then repeat the steps 10 or more times. Take your time and take a few normal breaths between deep breaths so that you do not get dizzy or light-headed. Do this every 1-2 hours when you are awake. If the spirometer includes a goal marker to show the highest number you have reached (best effort), use this as a goal to work toward during each repetition. After each set of 10 deep breaths, cough a few times. This will help to make sure that your lungs are clear. If you have an incision on your chest or abdomen from surgery, place a pillow or a rolled-up towel firmly against the incision when you cough. This can help to reduce pain while taking deep breaths and coughing. General tips When you are able to get out of bed: Walk around often. Continue to take deep breaths and cough in order to clear your lungs. Keep using the incentive spirometer until  your health care provider says it is okay to stop using it. If you have been in the hospital, you may be told to keep using the spirometer at home. Contact a health care provider if: You are having difficulty using the spirometer. You have trouble using the spirometer as often as instructed. Your pain medicine is not giving enough relief for you to use the spirometer as  told. You have a fever. Get help right away if: You develop shortness of breath. You develop a cough with bloody mucus from the lungs. You have fluid or blood coming from an incision site after you cough. Summary An incentive spirometer is a tool that can help you learn to take long, deep breaths to keep your lungs clear and active. You may be asked to use a spirometer after a surgery, if you have a lung problem or a history of smoking, or if you have been inactive for a long period of time. Use your incentive spirometer as instructed every 1-2 hours while you are awake. If you have an incision on your chest or abdomen, place a pillow or a rolled-up towel firmly against your incision when you cough. This will help to reduce pain. Get help right away if you have shortness of breath, you cough up bloody mucus, or blood comes from your incision when you cough. This information is not intended to replace advice given to you by your health care provider. Make sure you discuss any questions you have with your health care provider. Document Revised: 12/30/2019 Document Reviewed: 12/30/2019 Elsevier Patient Education  2023 [employer].

## 2024-06-14 LAB — CEA: CEA: 4.3 ng/mL (ref 0.0–4.7)

## 2024-06-17 ENCOUNTER — Other Ambulatory Visit: Payer: Self-pay

## 2024-06-17 ENCOUNTER — Inpatient Hospital Stay: Payer: Self-pay | Admitting: Urgent Care

## 2024-06-17 ENCOUNTER — Inpatient Hospital Stay
Admission: RE | Admit: 2024-06-17 | Discharge: 2024-06-20 | DRG: 331 | Disposition: A | Discharge status: Home / Self Care | Attending: Surgery | Admitting: Surgery

## 2024-06-17 ENCOUNTER — Encounter: Payer: Self-pay | Admitting: Surgery

## 2024-06-17 ENCOUNTER — Encounter
Admission: RE | Disposition: A | Discharge status: Home / Self Care | Payer: Self-pay | Source: Home / Self Care | Attending: Surgery

## 2024-06-17 DIAGNOSIS — J449 Chronic obstructive pulmonary disease, unspecified: Secondary | ICD-10-CM | POA: Diagnosis present

## 2024-06-17 DIAGNOSIS — Z90710 Acquired absence of both cervix and uterus: Secondary | ICD-10-CM | POA: Diagnosis not present

## 2024-06-17 DIAGNOSIS — I252 Old myocardial infarction: Secondary | ICD-10-CM | POA: Diagnosis not present

## 2024-06-17 DIAGNOSIS — Z882 Allergy status to sulfonamides status: Secondary | ICD-10-CM | POA: Diagnosis not present

## 2024-06-17 DIAGNOSIS — E785 Hyperlipidemia, unspecified: Secondary | ICD-10-CM | POA: Diagnosis present

## 2024-06-17 DIAGNOSIS — I10 Essential (primary) hypertension: Secondary | ICD-10-CM | POA: Diagnosis not present

## 2024-06-17 DIAGNOSIS — E039 Hypothyroidism, unspecified: Secondary | ICD-10-CM | POA: Diagnosis not present

## 2024-06-17 DIAGNOSIS — C184 Malignant neoplasm of transverse colon: Principal | ICD-10-CM | POA: Diagnosis present

## 2024-06-17 DIAGNOSIS — E119 Type 2 diabetes mellitus without complications: Secondary | ICD-10-CM | POA: Diagnosis not present

## 2024-06-17 DIAGNOSIS — K219 Gastro-esophageal reflux disease without esophagitis: Secondary | ICD-10-CM | POA: Diagnosis not present

## 2024-06-17 DIAGNOSIS — D509 Iron deficiency anemia, unspecified: Secondary | ICD-10-CM | POA: Diagnosis present

## 2024-06-17 DIAGNOSIS — R197 Diarrhea, unspecified: Secondary | ICD-10-CM | POA: Diagnosis not present

## 2024-06-17 DIAGNOSIS — C189 Malignant neoplasm of colon, unspecified: Principal | ICD-10-CM | POA: Diagnosis present

## 2024-06-17 LAB — GLUCOSE, CAPILLARY
Glucose-Capillary: 175 mg/dL — ABNORMAL HIGH (ref 70–99)
Glucose-Capillary: 159 mg/dL — ABNORMAL HIGH (ref 70–99)
Glucose-Capillary: 224 mg/dL — ABNORMAL HIGH (ref 70–99)
Glucose-Capillary: 286 mg/dL — ABNORMAL HIGH (ref 70–99)

## 2024-06-17 LAB — CREATININE, SERUM
Creatinine, Ser: 0.96 mg/dL (ref 0.44–1.00)
GFR, Estimated: >60 mL/min (ref >60)

## 2024-06-17 LAB — CBC
HCT: 31.9 % — ABNORMAL LOW (ref 36.0–46.0)
Hemoglobin: 9.9 g/dL — ABNORMAL LOW (ref 12.0–15.0)
MCH: 22.8 pg — ABNORMAL LOW (ref 26.0–34.0)
MCHC: 31 g/dL (ref 30.0–36.0)
MCV: 73.3 fL — ABNORMAL LOW (ref 80.0–100.0)
Platelets: 340 K/uL (ref 150–400)
RBC: 4.35 MIL/uL (ref 3.87–5.11)
RDW: 17.2 % — ABNORMAL HIGH (ref 11.5–15.5)
WBC: 12.7 K/uL — ABNORMAL HIGH (ref 4.0–10.5)
nRBC: 0 % (ref 0.0–0.2)

## 2024-06-17 LAB — ABO/RH: ABO/RH(D): O POS

## 2024-06-17 SURGERY — COLECTOMY, PARTIAL, ROBOT-ASSISTED, LAPAROSCOPIC
Anesthesia: General | Site: Abdomen | Laterality: Right

## 2024-06-17 MED ORDER — KETAMINE HCL 50 MG/5ML IJ SOSY
PREFILLED_SYRINGE | INTRAMUSCULAR | Status: AC
  Filled 2024-06-17: qty 5

## 2024-06-17 MED ORDER — LIDOCAINE HCL (CARDIAC) PF 100 MG/5ML IV SOSY
PREFILLED_SYRINGE | INTRAVENOUS | Status: DC | PRN
  Administered 2024-06-17: 100 mg via INTRAVENOUS

## 2024-06-17 MED ORDER — GLYCOPYRROLATE 0.2 MG/ML IJ SOLN
INTRAMUSCULAR | Status: DC | PRN
  Administered 2024-06-17: .2 mg via INTRAVENOUS

## 2024-06-17 MED ORDER — FENTANYL CITRATE (PF) 100 MCG/2ML IJ SOLN: 25.0000 ug | INTRAMUSCULAR | Status: DC | PRN

## 2024-06-17 MED ORDER — FLUOXETINE HCL 20 MG PO CAPS
20.0000 mg | ORAL_CAPSULE | Freq: Every day | ORAL | Status: DC
  Administered 2024-06-18 – 2024-06-20 (×3): 20 mg via ORAL
  Filled 2024-06-17 (×3): qty 1

## 2024-06-17 MED ORDER — LACTATED RINGERS IV SOLN: INTRAVENOUS | Status: DC | PRN

## 2024-06-17 MED ORDER — LEVOTHYROXINE SODIUM 50 MCG PO TABS
50.0000 ug | ORAL_TABLET | Freq: Every day | ORAL | Status: DC
  Administered 2024-06-18 – 2024-06-20 (×3): 50 ug via ORAL
  Filled 2024-06-17 (×3): qty 1

## 2024-06-17 MED ORDER — ENOXAPARIN SODIUM 40 MG/0.4ML IJ SOSY
40.0000 mg | PREFILLED_SYRINGE | INTRAMUSCULAR | Status: DC
  Administered 2024-06-18 – 2024-06-20 (×3): 40 mg via SUBCUTANEOUS
  Filled 2024-06-17 (×3): qty 0.4

## 2024-06-17 MED ORDER — OXYCODONE HCL 5 MG PO TABS: 5.0000 mg | ORAL_TABLET | ORAL | Status: DC | PRN

## 2024-06-17 MED ORDER — BUPIVACAINE-EPINEPHRINE (PF) 0.5% -1:200000 IJ SOLN
INTRAMUSCULAR | Status: DC | PRN
  Administered 2024-06-17: 30 mL via PERINEURAL

## 2024-06-17 MED ORDER — ROCURONIUM BROMIDE 100 MG/10ML IV SOLN
INTRAVENOUS | Status: DC | PRN
  Administered 2024-06-17 (×2): 20 mg via INTRAVENOUS
  Administered 2024-06-17: 50 mg via INTRAVENOUS
  Administered 2024-06-17: 20 mg via INTRAVENOUS
  Administered 2024-06-17: 30 mg via INTRAVENOUS

## 2024-06-17 MED ORDER — SODIUM CHLORIDE 0.9 % IV SOLN
INTRAVENOUS | Status: AC
  Filled 2024-06-17: qty 2

## 2024-06-17 MED ORDER — BUPIVACAINE-EPINEPHRINE (PF) 0.5% -1:200000 IJ SOLN
INTRAMUSCULAR | Status: AC
  Filled 2024-06-17: qty 30

## 2024-06-17 MED ORDER — DEXMEDETOMIDINE HCL IN NACL 80 MCG/20ML IV SOLN
INTRAVENOUS | Status: AC
  Filled 2024-06-17: qty 20

## 2024-06-17 MED ORDER — FENTANYL CITRATE (PF) 100 MCG/2ML IJ SOLN
INTRAMUSCULAR | Status: DC | PRN
  Administered 2024-06-17 (×2): 50 ug via INTRAVENOUS

## 2024-06-17 MED ORDER — DEXAMETHASONE SODIUM PHOSPHATE 10 MG/ML IJ SOLN
INTRAMUSCULAR | Status: DC | PRN
  Administered 2024-06-17 (×2): 5 mg via INTRAVENOUS

## 2024-06-17 MED ORDER — TRAMADOL HCL 50 MG PO TABS
50.0000 mg | ORAL_TABLET | Freq: Four times a day (QID) | ORAL | Status: DC | PRN
  Administered 2024-06-17 – 2024-06-19 (×2): 50 mg via ORAL
  Filled 2024-06-17 (×2): qty 1

## 2024-06-17 MED ORDER — INSULIN ASPART 100 UNIT/ML IJ SOLN
0.0000 [IU] | Freq: Three times a day (TID) | INTRAMUSCULAR | Status: DC
  Administered 2024-06-18 – 2024-06-20 (×6): 3 [IU] via SUBCUTANEOUS
  Filled 2024-06-17 (×6): qty 1

## 2024-06-17 MED ORDER — AMLODIPINE BESYLATE 10 MG PO TABS
10.0000 mg | ORAL_TABLET | Freq: Every day | ORAL | Status: DC
  Administered 2024-06-18 – 2024-06-19 (×2): 10 mg via ORAL
  Filled 2024-06-17 (×2): qty 1

## 2024-06-17 MED ORDER — PHENYLEPHRINE HCL-NACL 20-0.9 MG/250ML-% IV SOLN
INTRAVENOUS | Status: AC
  Filled 2024-06-17: qty 250

## 2024-06-17 MED ORDER — PHENYLEPHRINE HCL-NACL 20-0.9 MG/250ML-% IV SOLN
INTRAVENOUS | Status: DC | PRN
Start: 2024-06-17 — End: 2024-06-17
  Administered 2024-06-17: 30 ug/min via INTRAVENOUS

## 2024-06-17 MED ORDER — ONDANSETRON 4 MG PO TBDP: 4.0000 mg | ORAL_TABLET | Freq: Four times a day (QID) | ORAL | Status: DC | PRN

## 2024-06-17 MED ORDER — ONDANSETRON HCL 4 MG/2ML IJ SOLN: 4.0000 mg | Freq: Four times a day (QID) | INTRAMUSCULAR | Status: DC | PRN

## 2024-06-17 MED ORDER — INDOCYANINE GREEN 25 MG IV SOLR
INTRAVENOUS | Status: DC | PRN
Start: 2024-06-17 — End: 2024-06-17
  Administered 2024-06-17: 5 mg via INTRAVENOUS

## 2024-06-17 MED ORDER — SODIUM CHLORIDE 0.9 % IV SOLN: INTRAVENOUS | Status: DC

## 2024-06-17 MED ORDER — LACTATED RINGERS IV SOLN
INTRAVENOUS | Status: DC | PRN
Start: 2024-06-17 — End: 2024-06-17

## 2024-06-17 MED ORDER — OXYCODONE HCL 5 MG PO TABS: 5.0000 mg | ORAL_TABLET | Freq: Once | ORAL | Status: DC | PRN

## 2024-06-17 MED ORDER — CELECOXIB 200 MG PO CAPS
200.0000 mg | ORAL_CAPSULE | Freq: Two times a day (BID) | ORAL | Status: DC
  Administered 2024-06-17 – 2024-06-20 (×6): 200 mg via ORAL
  Filled 2024-06-17 (×6): qty 1

## 2024-06-17 MED ORDER — KETAMINE HCL 10 MG/ML IJ SOLN
INTRAMUSCULAR | Status: DC | PRN
  Administered 2024-06-17: 20 mg via INTRAVENOUS

## 2024-06-17 MED ORDER — PHENYLEPHRINE HCL (PRESSORS) 10 MG/ML IV SOLN
INTRAVENOUS | Status: DC | PRN
  Administered 2024-06-17: 80 ug via INTRAVENOUS

## 2024-06-17 MED ORDER — SUGAMMADEX SODIUM 200 MG/2ML IV SOLN
INTRAVENOUS | Status: DC | PRN
  Administered 2024-06-17: 400 mg via INTRAVENOUS

## 2024-06-17 MED ORDER — HYDROMORPHONE HCL 1 MG/ML IJ SOLN: 0.5000 mg | INTRAMUSCULAR | Status: DC | PRN

## 2024-06-17 MED ORDER — ORAL CARE MOUTH RINSE: 15.0000 mL | Freq: Once | OROMUCOSAL | Status: AC

## 2024-06-17 MED ORDER — PROPOFOL 10 MG/ML IV BOLUS
INTRAVENOUS | Status: DC | PRN
  Administered 2024-06-17: 150 mg via INTRAVENOUS

## 2024-06-17 MED ORDER — FENTANYL CITRATE (PF) 100 MCG/2ML IJ SOLN
INTRAMUSCULAR | Status: AC
  Filled 2024-06-17: qty 2

## 2024-06-17 MED ORDER — ALBUMIN HUMAN 5 % IV SOLN
INTRAVENOUS | Status: DC | PRN
Start: 2024-06-17 — End: 2024-06-17

## 2024-06-17 MED ORDER — ATORVASTATIN CALCIUM 20 MG PO TABS
20.0000 mg | ORAL_TABLET | Freq: Every day | ORAL | Status: DC
  Administered 2024-06-19 – 2024-06-20 (×2): 20 mg via ORAL
  Filled 2024-06-17 (×3): qty 1

## 2024-06-17 MED ORDER — SODIUM CHLORIDE 0.9 % IV SOLN
2.0000 g | Freq: Once | INTRAVENOUS | Status: AC
  Administered 2024-06-17: 2 g via INTRAVENOUS

## 2024-06-17 MED ORDER — ONDANSETRON HCL 4 MG/2ML IJ SOLN
INTRAMUSCULAR | Status: AC
  Filled 2024-06-17: qty 2

## 2024-06-17 MED ORDER — CHLORHEXIDINE GLUCONATE 0.12 % MT SOLN
15.0000 mL | Freq: Once | OROMUCOSAL | Status: AC
  Administered 2024-06-17: 15 mL via OROMUCOSAL

## 2024-06-17 MED ORDER — ALBUMIN HUMAN 5 % IV SOLN
INTRAVENOUS | Status: AC
  Filled 2024-06-17: qty 250

## 2024-06-17 MED ORDER — ACETAMINOPHEN 10 MG/ML IV SOLN
INTRAVENOUS | Status: DC | PRN
  Administered 2024-06-17: 1000 mg via INTRAVENOUS

## 2024-06-17 MED ORDER — OXYCODONE HCL 5 MG/5ML PO SOLN: 5.0000 mg | Freq: Once | ORAL | Status: DC | PRN

## 2024-06-17 MED ORDER — ONDANSETRON HCL 4 MG/2ML IJ SOLN
INTRAMUSCULAR | Status: DC | PRN
  Administered 2024-06-17: 4 mg via INTRAVENOUS

## 2024-06-17 MED ORDER — LOSARTAN POTASSIUM 50 MG PO TABS
100.0000 mg | ORAL_TABLET | Freq: Every day | ORAL | Status: DC
  Administered 2024-06-19 – 2024-06-20 (×2): 100 mg via ORAL
  Filled 2024-06-17 (×3): qty 2

## 2024-06-17 MED ORDER — GABAPENTIN 300 MG PO CAPS
300.0000 mg | ORAL_CAPSULE | Freq: Two times a day (BID) | ORAL | Status: DC
  Administered 2024-06-17 – 2024-06-20 (×5): 300 mg via ORAL
  Filled 2024-06-17 (×6): qty 1

## 2024-06-17 MED ORDER — LOSARTAN POTASSIUM-HCTZ 100-25 MG PO TABS: 1.0000 | ORAL_TABLET | Freq: Every day | ORAL | Status: DC

## 2024-06-17 MED ORDER — CLONAZEPAM 0.5 MG PO TABS: 0.5000 mg | ORAL_TABLET | Freq: Two times a day (BID) | ORAL | Status: DC | PRN

## 2024-06-17 MED ORDER — ACETAMINOPHEN 10 MG/ML IV SOLN
INTRAVENOUS | Status: AC
  Filled 2024-06-17: qty 100

## 2024-06-17 MED ORDER — 0.9 % SODIUM CHLORIDE (POUR BTL) OPTIME
TOPICAL | Status: DC | PRN
  Administered 2024-06-17: 500 mL

## 2024-06-17 MED ORDER — HYDROCHLOROTHIAZIDE 25 MG PO TABS
25.0000 mg | ORAL_TABLET | Freq: Every day | ORAL | Status: DC
  Administered 2024-06-18 – 2024-06-20 (×3): 25 mg via ORAL
  Filled 2024-06-17 (×3): qty 1

## 2024-06-17 MED ORDER — ACETAMINOPHEN 325 MG PO TABS: 650.0000 mg | ORAL_TABLET | Freq: Four times a day (QID) | ORAL | Status: DC | PRN

## 2024-06-17 MED ORDER — DEXMEDETOMIDINE HCL IN NACL 80 MCG/20ML IV SOLN
INTRAVENOUS | Status: DC | PRN
  Administered 2024-06-17 (×2): 4 ug via INTRAVENOUS

## 2024-06-17 SURGICAL SUPPLY — 58 items
BASIN KIT SINGLE STR (MISCELLANEOUS) ×1 IMPLANT
BLADE CLIPPER SURG (BLADE) ×1 IMPLANT
CANNULA CAP OBTURATR AIRSEAL 8 (CAP) ×1 IMPLANT
COVER TIP SHEARS 8 DVNC (MISCELLANEOUS) ×1 IMPLANT
DEFOGGER SCOPE WARM SEASHARP (MISCELLANEOUS) ×1 IMPLANT
DERMABOND ADVANCED .7 DNX12 (GAUZE/BANDAGES/DRESSINGS) IMPLANT
DRAPE ARM DVNC X/XI (DISPOSABLE) ×4 IMPLANT
DRAPE COLUMN DVNC XI (DISPOSABLE) ×1 IMPLANT
DRSG OPSITE POSTOP 4X10 (GAUZE/BANDAGES/DRESSINGS) IMPLANT
DRSG OPSITE POSTOP 4X8 (GAUZE/BANDAGES/DRESSINGS) IMPLANT
ELECTRODE REM PT RTRN 9FT ADLT (ELECTROSURGICAL) ×1 IMPLANT
FORCEPS BPLR FENES DVNC XI (FORCEP) ×1 IMPLANT
GLOVE BIOGEL PI IND STRL 7.0 (GLOVE) ×3 IMPLANT
GLOVE SURG SYN 6.5 PF PI (GLOVE) ×5 IMPLANT
GOWN STRL REUS W/ TWL LRG LVL3 (GOWN DISPOSABLE) ×7 IMPLANT
GRASPER SUT TROCAR 14GX15 (MISCELLANEOUS) IMPLANT
GRASPER TIP-UP FEN DVNC XI (INSTRUMENTS) ×1 IMPLANT
HANDLE YANKAUER SUCT BULB TIP (MISCELLANEOUS) ×1 IMPLANT
IRRIGATION STRYKERFLOW (MISCELLANEOUS) IMPLANT
IRRIGATOR SUCT 8 DISP DVNC XI (IRRIGATION / IRRIGATOR) IMPLANT
IV NS 1000ML BAXH (IV SOLUTION) IMPLANT
KIT IMAGING PINPOINTPAQ (MISCELLANEOUS) IMPLANT
KIT PINK PAD W/HEAD ARM REST (MISCELLANEOUS) ×1 IMPLANT
LABEL OR SOLS (LABEL) IMPLANT
MANIFOLD NEPTUNE II (INSTRUMENTS) ×1 IMPLANT
NDL DRIVE SUT CUT DVNC (INSTRUMENTS) ×1 IMPLANT
NDL HYPO 22X1.5 SAFETY MO (MISCELLANEOUS) ×1 IMPLANT
NDL INSUFFLATION 14GA 120MM (NEEDLE) ×1 IMPLANT
NEEDLE DRIVE SUT CUT DVNC (INSTRUMENTS) ×1 IMPLANT
NEEDLE HYPO 22X1.5 SAFETY MO (MISCELLANEOUS) ×1 IMPLANT
NEEDLE INSUFFLATION 14GA 120MM (NEEDLE) ×1 IMPLANT
OBTURATOR OPTICALSTD 8 DVNC (TROCAR) ×1 IMPLANT
PACK COLON CLEAN CLOSURE (MISCELLANEOUS) ×1 IMPLANT
PACK LAP CHOLECYSTECTOMY (MISCELLANEOUS) ×1 IMPLANT
RELOAD STAPLE 60 3.5 BLU DVNC (STAPLE) IMPLANT
RETRACTOR WOUND ALXS 18CM MED (MISCELLANEOUS) IMPLANT
RETRACTOR WOUND ALXS 18CM SML (MISCELLANEOUS) IMPLANT
SCISSORS MNPLR CVD DVNC XI (INSTRUMENTS) ×1 IMPLANT
SEAL UNIV 5-12 XI (MISCELLANEOUS) ×3 IMPLANT
SEALER VESSEL EXT DVNC XI (MISCELLANEOUS) IMPLANT
SET TUBE FILTERED XL AIRSEAL (SET/KITS/TRAYS/PACK) ×1 IMPLANT
SLEEVE Z-THREAD 5X100MM (TROCAR) ×1 IMPLANT
SOLUTION ELECTROSURG ANTI STCK (MISCELLANEOUS) ×1 IMPLANT
SPONGE T-LAP 18X18 ~~LOC~~+RFID (SPONGE) ×1 IMPLANT
STAPLER 60 SUREFORM DVNC (STAPLE) IMPLANT
STAPLER SKIN PROX 35W (STAPLE) IMPLANT
SUT PDS AB 1 CT1 36 (SUTURE) ×2 IMPLANT
SUT SILK 3 0 SH 30 (SUTURE) IMPLANT
SUT STRATA 3-0 23 RB-1.5 (SUTURE) IMPLANT
SUT VIC AB 3-0 SH 27X BRD (SUTURE) IMPLANT
SUT VICRYL 0 UR6 27IN ABS (SUTURE) ×1 IMPLANT
SUTURE MNCRL 4-0 27XMF (SUTURE) ×1 IMPLANT
SYR 30ML LL (SYRINGE) ×2 IMPLANT
SYSTEM LAPSCP GELPORT 120MM (MISCELLANEOUS) IMPLANT
SYSTEM WECK SHIELD CLOSURE (TROCAR) IMPLANT
TRAP FLUID SMOKE EVACUATOR (MISCELLANEOUS) ×1 IMPLANT
TRAY FOLEY MTR SLVR 16FR STAT (SET/KITS/TRAYS/PACK) ×1 IMPLANT
WATER STERILE IRR 500ML POUR (IV SOLUTION) ×1 IMPLANT

## 2024-06-17 NOTE — Transfer of Care (Signed)
 Immediate Anesthesia Transfer of Care Note  Patient: Robin Bass  Procedure(s) Performed: COLECTOMY, PARTIAL, ROBOT-ASSISTED, LAPAROSCOPIC (Right: Abdomen)  Patient Location: PACU  Anesthesia Type:General  Level of Consciousness: awake, alert , and drowsy  Airway & Oxygen Therapy: Patient Spontanous Breathing and Patient connected to nasal cannula oxygen  Post-op Assessment: Report given to RN and Post -op Vital signs reviewed and stable  Post vital signs: Reviewed and stable  Last Vitals:  Vitals Value Taken Time  BP 126/53 06/17/24 17:43  Temp    Pulse 88 06/17/24 17:43  Resp 17 06/17/24 17:43  SpO2 100 % 06/17/24 17:43  Vitals shown include unfiled device data.  Last Pain:  Vitals:   06/17/24 1134  TempSrc: Oral  PainSc: 0-No pain         Complications: No notable events documented.

## 2024-06-17 NOTE — Interval H&P Note (Signed)
 Spleen MRI reviewed. Ok to proceed. All questions addressed

## 2024-06-17 NOTE — Anesthesia Postprocedure Evaluation (Signed)
 Anesthesia Post Note  Patient: Robin Bass  Procedure(s) Performed: COLECTOMY, PARTIAL, ROBOT-ASSISTED, LAPAROSCOPIC (Right: Abdomen)  Patient location during evaluation: PACU Anesthesia Type: General Level of consciousness: awake and alert, oriented and patient cooperative Pain management: pain level controlled Vital Signs Assessment: post-procedure vital signs reviewed and stable Respiratory status: spontaneous breathing, nonlabored ventilation and respiratory function stable Cardiovascular status: blood pressure returned to baseline and stable Postop Assessment: adequate PO intake Anesthetic complications: no   No notable events documented.   Last Vitals:  Vitals:   06/17/24 1815 06/17/24 1817  BP: (!) 118/49 (!) 118/48  Pulse: 87 87  Resp: 12 11  Temp:  36.5 C  SpO2: 95% 96%    Last Pain:  Vitals:   06/17/24 1817  TempSrc:   PainSc: 0-No pain                 Alfonso Ruths

## 2024-06-17 NOTE — Anesthesia Preprocedure Evaluation (Signed)
 Anesthesia Evaluation  Patient identified by MRN, date of birth, ID band Patient awake    Reviewed: Allergy & Precautions, NPO status , Patient's Chart, lab work & pertinent test results  History of Anesthesia Complications Negative for: history of anesthetic complications  Airway Mallampati: III  TM Distance: <3 FB Neck ROM: full    Dental  (+) Chipped, Implants   Pulmonary shortness of breath and with exertion, asthma , COPD   Pulmonary exam normal        Cardiovascular hypertension, (-) angina (-) Past MI Normal cardiovascular exam     Neuro/Psych  PSYCHIATRIC DISORDERS      negative neurological ROS     GI/Hepatic Neg liver ROS,GERD  Controlled,,  Endo/Other  diabetes, Type 2Hypothyroidism    Renal/GU      Musculoskeletal   Abdominal   Peds  Hematology negative hematology ROS (+)   Anesthesia Other Findings Past Medical History: No date: Anemia No date: Anxiety No date: Asthma No date: Basal cell carcinoma     Comment:  a lot of skin cancer 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) No date: COPD (chronic obstructive pulmonary disease) (HCC) No date: Depression 08/21/2014: Diabetes mellitus type 2, controlled, without  complications (HCC) No date: GERD (gastroesophageal reflux disease) No date: History of pituitary tumor 06/04/2024: Hypokalemia No date: Hypothyroidism No date: Overactive bladder 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 [facility])  Past Surgical History: 2005: ABDOMINAL HYSTERECTOMY 2005: MOUTH SURGERY     Comment:  floor of the mouth, tumor removed  BMI    Body Mass Index: 32.60 kg/m      Reproductive/Obstetrics negative OB ROS                              Anesthesia Physical Anesthesia Plan  ASA: 3  Anesthesia Plan: General ETT   Post-op Pain Management:    Induction: Intravenous  PONV Risk Score and Plan: Ondansetron , Dexamethasone , Midazolam and  Treatment may vary due to age or medical condition  Airway Management Planned: Oral ETT  Additional Equipment:   Intra-op Plan:   Post-operative Plan: Extubation in OR  Informed Consent: I have reviewed the patients History and Physical, chart, labs and discussed the procedure including the risks, benefits and alternatives for the proposed anesthesia with the patient or authorized representative who has indicated his/her understanding and acceptance.     Dental Advisory Given  Plan Discussed with: Anesthesiologist, CRNA and Surgeon  Anesthesia Plan Comments: (Patient consented for risks of anesthesia including but not limited to:  - adverse reactions to medications - damage to eyes, teeth, lips or other oral mucosa - nerve damage due to positioning  - sore throat or hoarseness - Damage to heart, brain, nerves, lungs, other parts of body or loss of life  Patient voiced understanding and assent.)        Anesthesia Quick Evaluation

## 2024-06-17 NOTE — Anesthesia Procedure Notes (Signed)
 Procedure Name: Intubation Date/Time: 06/17/2024 2:05 PM  Performed by: Rhodesia Stanger, CRNAPre-anesthesia Checklist: Patient identified, Emergency Drugs available, Suction available and Patient being monitored Patient Re-evaluated:Patient Re-evaluated prior to induction Oxygen Delivery Method: Circle System Utilized Preoxygenation: Pre-oxygenation with 100% oxygen Induction Type: IV induction Ventilation: Mask ventilation without difficulty Laryngoscope Size: Mac and 3 Grade View: Grade II Tube type: Oral Tube size: 6.5 mm Number of attempts: 1 Airway Equipment and Method: Stylet and Oral airway Placement Confirmation: ETT inserted through vocal cords under direct vision, positive ETCO2 and breath sounds checked- equal and bilateral Secured at: 20 cm Tube secured with: Tape Dental Injury: Teeth and Oropharynx as per pre-operative assessment  Comments: Grade 2b view. Anterior airway. ETT placed with ease. Lips, teeth, and tongue unchanged. Head and neck midline.

## 2024-06-18 LAB — GLUCOSE, CAPILLARY
Glucose-Capillary: 138 mg/dL — ABNORMAL HIGH (ref 70–99)
Glucose-Capillary: 128 mg/dL — ABNORMAL HIGH (ref 70–99)
Glucose-Capillary: 164 mg/dL — ABNORMAL HIGH (ref 70–99)
Glucose-Capillary: 139 mg/dL — ABNORMAL HIGH (ref 70–99)

## 2024-06-18 LAB — CBC
HCT: 30.4 % — ABNORMAL LOW (ref 36.0–46.0)
Hemoglobin: 9.4 g/dL — ABNORMAL LOW (ref 12.0–15.0)
MCH: 23.1 pg — ABNORMAL LOW (ref 26.0–34.0)
MCHC: 30.9 g/dL (ref 30.0–36.0)
MCV: 74.7 fL — ABNORMAL LOW (ref 80.0–100.0)
Platelets: 366 K/uL (ref 150–400)
RBC: 4.07 MIL/uL (ref 3.87–5.11)
RDW: 17.1 % — ABNORMAL HIGH (ref 11.5–15.5)
WBC: 10.3 K/uL (ref 4.0–10.5)
nRBC: 0 % (ref 0.0–0.2)

## 2024-06-18 LAB — BASIC METABOLIC PANEL WITH GFR
Anion gap: 6 (ref 5–15)
BUN: 14 mg/dL (ref 8–23)
CO2: 24 mmol/L (ref 22–32)
Calcium: 8.6 mg/dL — ABNORMAL LOW (ref 8.9–10.3)
Chloride: 108 mmol/L (ref 98–111)
Creatinine, Ser: 0.87 mg/dL (ref 0.44–1.00)
GFR, Estimated: >60 mL/min (ref >60)
Glucose, Bld: 197 mg/dL — ABNORMAL HIGH (ref 70–99)
Potassium: 3.5 mmol/L (ref 3.5–5.1)
Sodium: 138 mmol/L (ref 135–145)

## 2024-06-18 MED ORDER — MELATONIN 5 MG PO TABS
2.5000 mg | ORAL_TABLET | Freq: Every day | ORAL | Status: DC
  Administered 2024-06-18 – 2024-06-19 (×2): 2.5 mg via ORAL
  Filled 2024-06-18 (×2): qty 1

## 2024-06-18 NOTE — Op Note (Signed)
 Preoperative diagnosis: proximal transverse colon CA  Postoperative diagnosis: Same  Procedure: Robotic assisted laparoscopic right colectomy.   Anesthesia: GETA   Surgeon: Henriette Pierre Assistant: Barrientos-Solis, PA for closure   Wound Classification: clean contaminated   Specimen: Right colon   Complications: None   Estimated Blood Loss: 80 mL  Indications: Please see H&P for further details.     FIndings: 1.  proximal transverese colon mass with tattoo 2.  Adequate hemostasis.  4.  No gross metastasis noted  Operation performed with curative intent:Yes  Tumor Location:Transverse colon  Extent of colon and vascular resection:Right hemicolectomy - ileocolic, right colic present   Description of procedure: The patient was placed on the operating table in the supine position, both arms tucked. General anesthesia was induced. A time-out was completed verifying correct patient, procedure, site, positioning, and implant(s) and/or special equipment prior to beginning this procedure. The abdomen was prepped and draped in the usual sterile fashion.    Palmer's point located and Veress needle was inserted.  After confirming 2 clicks and a positive saline drop test, gas insufflation was initiated until the abdominal pressure was measured at 15 mmHg.  Afterwards, the Veress needle was removed and a 8 mm port was placed through the same site using Optiview technique after extending the incision with an 11 blade.  After local was infused, 3 additional incision was made 8 cm apart along the left side of the abdominal wall from the initial incision and two 8mm and one 12mm port placed under direct visualization.  No injuries from trocar placements were noted. The table was placed in the reverse Trendelenburg position with the right side elevated.  Xi robotic platform was then brought to the operative field and docked at an angle from the left lower quadrant.  Tip up grasper and hook cautery was  placed in right arm ports.  Fenestrated bipolar in left arm port.   Examination of the abdominal cavity noted no signs of gross metastasis.  Dissection was started by removing the lateral attachments of the right colon along the white line of Toldt, ensuring the right ureter was not involved.  This was carried around the hepatic flexure to the mid portion of the transverse colon.  Omentum was transected from the transverse colon to the same point.  Afterwards, the right colon was grasped and elevated to visualize mesentary. Point was chosen on the transverse colon for staple transection, proximal to the visualized middle colic vessels.   60mm blue load stapler was then used to transect the colon at this point.  Vessel sealer was then used to transect the right colon mesentery towards a previously determined point on the terminal ileum, care taken to ensure as much mesentery was taken for lymph node evaluation, and also visualizing the duodenum and placing it away from area of transection during this portion.  Once the terminal ileum was reached, 60mm blue load stapler was used to transect the terminal ileum.  ICG was then infused and the staple lines were confirmed to have adequate blood flow.   The transected specimen was placed atop the liver.  The terminal ileum was then brought towards the transverse colon and a side-to-side anastomosis was created with the small bowel laying not twisted.  Enterotomies were made in the small bowel and the transverse colon 6 cm from the staple lines, after placing stay suture using 3-0 vicryl. 60 mm blue load stapler placed through these enterotomies and new anastomosis created.  The enterotomy was then  closed with running 3-0 STRATAFIX in a 2 layer fashion.    No bleeding or additional pathology was noted. Robot was then undocked, and the remaining port sites were removed, the abdomen was allowed to collapse.  The 12 mm port site fascia extended, wound protector placed,  and specimen was able to be removed out of the abdomen completely.   Clean closure protocol initiated and extraction site closed using 1 PDS x 2. Deep dermal then closed with 3-0 vicryl interrupted fashion.  All skin incisions then closed with staples.  Wounds then dressed with honeycomb dressing.   The patient tolerated the procedure well, awakened from anesthesia and was taken to the postanesthesia care unit in satisfactory condition.  Foley still in place.  Sponge count and instrument count correct at the end of the procedure.

## 2024-06-18 NOTE — Progress Notes (Signed)
 [facility]- General Surgery  SURGICAL PROGRESS NOTE  Hospital Day(s): 1.   Post op day(s): 1 Day Post-Op.   Interval History:  Patient seen and examined. Reports doing well. She ambulated once yesterday with minimal pain. Has been drinking water, tolerating it well. Denies any nausea or vomiting. Denies any flatulence or bowel movement.   Vital signs in last 24 hours: [min-max] current  Temp:  [97.6 F (36.4 C)-98.6 F (37 C)] 98.6 F (37 C) (08/26 0513) Pulse Rate:  [78-102] 78 (08/26 0513) Resp:  [11-27] 16 (08/26 0513) BP: (107-133)/(44-91) 109/54 (08/26 0513) SpO2:  [94 %-100 %] 95 % (08/26 0513) Weight:  [73.2 kg] 73.2 kg (08/25 1134)     Height: 4' 11 (149.9 cm) Weight: 73.2 kg BMI (Calculated): 32.58   Intake/Output last 2 shifts:  08/25 0701 - 08/26 0700 In: 1750 [I.V.:1300; IV Piggyback:450] Out: 930 [Urine:900; Blood:30]   Physical Exam:  Constitutional: alert, cooperative and no distress  Respiratory: breathing non-labored at rest  Cardiovascular: regular rate and sinus rhythm  Gastrointestinal: soft, non-tender, and non-distended, honeycomb dressings intact, incisions appear clean and dry, no drainage   Labs:     Latest Ref Rng & Units 06/18/2024    4:14 AM 06/17/2024    7:13 PM 06/13/2024    2:41 PM  CBC  WBC 4.0 - 10.5 K/uL 10.3  12.7  7.5   Hemoglobin 12.0 - 15.0 g/dL 9.4  9.9  89.2   Hematocrit 36.0 - 46.0 % 30.4  31.9  34.7   Platelets 150 - 400 K/uL 366  340  419       Latest Ref Rng & Units 06/18/2024    4:14 AM 06/17/2024    7:13 PM 06/13/2024    2:41 PM  CMP  Glucose 70 - 99 mg/dL 802   855   BUN 8 - 23 mg/dL 14   20   Creatinine 9.55 - 1.00 mg/dL 9.12  9.03  9.18   Sodium 135 - 145 mmol/L 138   141   Potassium 3.5 - 5.1 mmol/L 3.5   3.5   Chloride 98 - 111 mmol/L 108   102   CO2 22 - 32 mmol/L 24   26   Calcium  8.9 - 10.3 mg/dL 8.6   89.4     Imaging studies: No new pertinent imaging studies   Assessment/Plan:  73 y.o. female with  proximal transverse colon cancer 1 Day Post-Op s/p robotic assisted laparoscopic right colectomy, complicated by pertinent comorbidities including diabetes mellitus type 2, essential hypertension, hyperlipidemia, essential hypertension, and microcytic anemia.    - No fever, not tachycardic, and no leukocytosis    - Plan to advance to full liquids if patient tolerates clears for breakfast   - Continue to closely monitor for return of GI function  - Placed order to remove foley catheter   - Encouraged to ambulate  - Continue pain management and DVT prophylaxis with Lovenox    -- Ruby Logiudice Barrientos PA-C

## 2024-06-19 LAB — GLUCOSE, CAPILLARY
Glucose-Capillary: 162 mg/dL — ABNORMAL HIGH (ref 70–99)
Glucose-Capillary: 153 mg/dL — ABNORMAL HIGH (ref 70–99)
Glucose-Capillary: 198 mg/dL — ABNORMAL HIGH (ref 70–99)
Glucose-Capillary: 193 mg/dL — ABNORMAL HIGH (ref 70–99)

## 2024-06-19 LAB — CBC
HCT: 32.6 % — ABNORMAL LOW (ref 36.0–46.0)
Hemoglobin: 9.9 g/dL — ABNORMAL LOW (ref 12.0–15.0)
MCH: 22.8 pg — ABNORMAL LOW (ref 26.0–34.0)
MCHC: 30.4 g/dL (ref 30.0–36.0)
MCV: 75.1 fL — ABNORMAL LOW (ref 80.0–100.0)
Platelets: 348 K/uL (ref 150–400)
RBC: 4.34 MIL/uL (ref 3.87–5.11)
RDW: 17.2 % — ABNORMAL HIGH (ref 11.5–15.5)
WBC: 9.3 K/uL (ref 4.0–10.5)
nRBC: 0 % (ref 0.0–0.2)

## 2024-06-19 LAB — BASIC METABOLIC PANEL WITH GFR
Anion gap: 8 (ref 5–15)
BUN: 11 mg/dL (ref 8–23)
CO2: 28 mmol/L (ref 22–32)
Calcium: 9 mg/dL (ref 8.9–10.3)
Chloride: 105 mmol/L (ref 98–111)
Creatinine, Ser: 0.76 mg/dL (ref 0.44–1.00)
GFR, Estimated: >60 mL/min (ref >60)
Glucose, Bld: 115 mg/dL — ABNORMAL HIGH (ref 70–99)
Potassium: 4 mmol/L (ref 3.5–5.1)
Sodium: 141 mmol/L (ref 135–145)

## 2024-06-19 MED ORDER — ALUM & MAG HYDROXIDE-SIMETH 200-200-20 MG/5ML PO SUSP
15.0000 mL | Freq: Four times a day (QID) | ORAL | Status: DC | PRN
  Administered 2024-06-19: 15 mL via ORAL
  Filled 2024-06-19: qty 30

## 2024-06-19 NOTE — Progress Notes (Signed)
 [facility]- General Surgery  SURGICAL PROGRESS NOTE  Hospital Day(s): 2.   Post op day(s): 2 Days Post-Op.   Interval History:  Patient reports doing well. She states she had 2 loose bowel movements yesterday.  Has been tolerating full liquid diet.  Denies any nausea or vomiting. States she walked 3 laps around the nursing station yesterday with minimal abdominal pain. Patient does not have any complaints.   Vital signs in last 24 hours: [min-max] current  Temp:  [98 F (36.7 C)-98.5 F (36.9 C)] 98.2 F (36.8 C) (08/27 0907) Pulse Rate:  [65-81] 80 (08/27 0907) Resp:  [16-18] 18 (08/27 0907) BP: (115-130)/(51-56) 129/54 (08/27 0907) SpO2:  [95 %-100 %] 98 % (08/27 0907)     Height: 4' 11 (149.9 cm) Weight: 73.2 kg BMI (Calculated): 32.58   Intake/Output last 2 shifts:  08/26 0701 - 08/27 0700 In: 520 [P.O.:520] Out: -    Physical Exam:  Constitutional: alert, cooperative and no distress  Respiratory: breathing non-labored at rest  Cardiovascular: regular rate and sinus rhythm  Gastrointestinal: soft, tender near incisions, and non-distended, honeycomb dressing intact with clean and dry incisions   Labs:     Latest Ref Rng & Units 06/19/2024    5:20 AM 06/18/2024    4:14 AM 06/17/2024    7:13 PM  CBC  WBC 4.0 - 10.5 K/uL 9.3  10.3  12.7   Hemoglobin 12.0 - 15.0 g/dL 9.9  9.4  9.9   Hematocrit 36.0 - 46.0 % 32.6  30.4  31.9   Platelets 150 - 400 K/uL 348  366  340       Latest Ref Rng & Units 06/19/2024    5:20 AM 06/18/2024    4:14 AM 06/17/2024    7:13 PM  CMP  Glucose 70 - 99 mg/dL 884  802    BUN 8 - 23 mg/dL 11  14    Creatinine 9.55 - 1.00 mg/dL 9.23  9.12  9.03   Sodium 135 - 145 mmol/L 141  138    Potassium 3.5 - 5.1 mmol/L 4.0  3.5    Chloride 98 - 111 mmol/L 105  108    CO2 22 - 32 mmol/L 28  24    Calcium  8.9 - 10.3 mg/dL 9.0  8.6      Imaging studies: No new pertinent imaging studies   Assessment/Plan:  73 y.o. female with proximal transverse  colon cancer  2 Days Post-Op s/p robotic assisted laparoscopic right colectomy, complicated by pertinent comorbidities including diabetes mellitus type 2, essential hypertension, hyperlipidemia, essential hypertension, and microcytic anemia.    - Stable vital signs, no leukocytosis  - Tolerated full liquids, advance to regular diet this morning.  - Patient is overall recovering well. Plan to discharge tomorrow if patient tolerates regular diet today, continues to have minimal abdominal discomfort and GI function  - Encouraged to continue to ambulate  - Continue pain medication as needed  - Continue DVT prophylaxis with Lovenox   -- Sayda Grable Barrientos PA-C

## 2024-06-19 NOTE — Care Management Important Message (Signed)
 Important Message  Patient Details  Name: Robin Bass MRN: [id number] Date of Birth: 01/05/1951   Important Message Given:  Yes - Medicare IM     Rojelio SHAUNNA Rattler 06/19/2024, 12:50 PM

## 2024-06-20 LAB — GLUCOSE, CAPILLARY
Glucose-Capillary: 189 mg/dL — ABNORMAL HIGH (ref 70–99)
Glucose-Capillary: 184 mg/dL — ABNORMAL HIGH (ref 70–99)

## 2024-06-20 LAB — SURGICAL PATHOLOGY

## 2024-06-20 MED ORDER — HYDROCODONE-ACETAMINOPHEN 5-325 MG PO TABS: 1.0000 | ORAL_TABLET | Freq: Three times a day (TID) | ORAL | 0 refills | Status: AC | PRN

## 2024-06-20 NOTE — Discharge Instructions (Addendum)
  Diet: Resume home heart healthy regular diet.   Activity: No heavy lifting >20 pounds (children, pets, laundry, garbage) or strenuous activity until follow-up, but light activity and walking are encouraged. Do not drive or drink alcohol if taking narcotic pain medications.  Wound care: Remove dressing tomorrow. Once dressing removed, may shower with soapy water and pat dry (do not rub incisions), but no baths or submerging incision underwater until follow-up. (no swimming)   Medications: Resume all home medications. For mild to moderate pain: acetaminophen  (Tylenol ) or ibuprofen (if no kidney disease). Combining Tylenol  with alcohol can substantially increase your risk of causing liver disease. Narcotic pain medications, if prescribed, can be used for severe pain, though may cause nausea, constipation, and drowsiness. Do not combine Tylenol  and Norco within a 6 hour period as Norco contains Tylenol . If you do not need the narcotic pain medication, you do not need to fill the prescription. Can take Imodium as needed for diarrhea.   Call office [phone]) at any time if any questions, worsening pain, fevers/chills, bleeding, drainage from incision site, or other concerns.

## 2024-06-20 NOTE — Plan of Care (Signed)

## 2024-06-20 NOTE — Progress Notes (Signed)
 Reena Richardson Hutchinson to be D/C'd Home per MD order.  Discussed with the patient and all questions fully answered.  VSS, Skin clean, dry and intact without evidence of skin break down, no evidence of skin tears noted. IV catheters discontinued intact. Site without signs and symptoms of complications. Dressing and pressure applied.  An After Visit Summary was printed and given to the patient. Patient prescription sent to her pharmacy.  D/c education completed with patient/family including follow up instructions, medication list, d/c activities limitations if indicated, with other d/c instructions as indicated by MD - patient able to verbalize understanding, all questions fully answered.   Patient instructed to return to ED, call 911, or call MD for any changes in condition.   Patient escorted via WC, and D/C home via private auto.  Lin Glazier L Amori Cooperman 06/20/2024 2:00 PM

## 2024-06-20 NOTE — Discharge Summary (Signed)
 [facility]-General Surgery  SURGICAL DISCHARGE SUMMARY  Patient ID: Robin Bass MRN: [id number] DOB/AGE: November 21, 1950 73 y.o.  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 Surgery went well. Patient tolerated procedure.  Patient had a couple loose bowel movements the following day. She was eventually advanced  to regular diet and is tolerating it well. Denies any nausea or vomiting. Surgical incisions show no signs of infection. Abdominal pain has been minimal. Ambulating with no issue.  Her main concern has been diarrhea, explained that it was normal and expected given her recent surgery. She will follow-up outpatient with Dr. Tye in one week.    Physical Examination:  Constitutional: alert, in no acute distress Pulmonary: CTA bilaterally, normal breath sounds Cardiac: regular rate and rhythm Gastrointestinal: soft, mildly tender near incisions, and non-distended, honeycomb dressing clean, and staples underneath intact    Allergies as of 06/20/2024       Reactions   Sulfonamide Derivatives Dermatitis   Ace Inhibitors Cough        Medication List     TAKE these medications     albuterol  108 (90 Base) MCG/ACT inhaler Commonly known as: VENTOLIN  HFA Inhale 2 puffs into the lungs every 4 (four) hours as needed for wheezing.   amLODipine  10 MG tablet Commonly known as: NORVASC  Take 1 tablet (10 mg total) by mouth at bedtime.   atorvastatin  20 MG tablet Commonly known as: LIPITOR TAKE ONE TABLET BY MOUTH ONCE DAILY   clindamycin 1 % lotion Commonly known as: CLEOCIN T Apply 1 Application topically daily as needed (irritation).   clonazePAM  0.5 MG tablet Commonly known as: KLONOPIN  TAKE ONE TABLET (0.5 MG TOTAL) BY MOUTH TWO TIMES DAILY AS NEEDED FOR ANXIETY. What changed: See the new instructions.   cyanocobalamin  1000 MCG tablet Commonly known as: VITAMIN B12 Take 1,000 mcg by mouth daily.   ferrous sulfate 324 MG Tbec Take 324 mg by mouth.   FLUoxetine  20 MG capsule Commonly known as: PROZAC  TAKE ONE CAPSULE BY MOUTH ONCE DAILY   HYDROcodone -acetaminophen  5-325 MG tablet Commonly known as: NORCO/VICODIN Take 1 tablet by mouth every 8 (eight) hours as needed for up to 3 days.   levothyroxine  50 MCG tablet Commonly known as: SYNTHROID  TAKE ONE TAB BY MOUTH ONCE DAILY. TAKE ON AN EMPTY STOMACH WITH A GLASS OF WATER ATLEAST 30-60 MINUTES BEFORE BREAKFAST   losartan -hydrochlorothiazide  100-25 MG tablet Commonly known as: HYZAAR TAKE ONE TABLET BY MOUTH ONCE DAILY   meloxicam  15 MG tablet Commonly known as: MOBIC  Take 1 tablet (15 mg total) by mouth daily. What changed:  when to take this reasons to take this   metFORMIN  500 MG 24 hr tablet Commonly known as: GLUCOPHAGE -XR TAKE ONE TABLET (500 MG TOTAL) BY MOUTH DAILY WITH BREAKFAST.  ONE TOUCH ULTRA TEST test strip Generic drug: glucose blood USE TO CHECK BLOOD SUGAR TWICE A DAY AND AS DIRECTED   OneTouch Delica Lancets 33G Misc Use to check blood sugar two times a day   potassium chloride  SA 20 MEQ tablet Commonly known as: KLOR-CON  M Take 1 tablet (20 mEq total) by mouth  daily.   vitamin C 1000 MG tablet Take 1,000 mg by mouth daily.   Vitamin D  50 MCG (2000 UT) Caps Take 2,000 Units by mouth daily.   Vitamin D  (Cholecalciferol) 25 MCG (1000 UT) Tabs Take by mouth. Takes 1/2          Follow-up Information     Sakai, Isami, DO Follow up in 6 day(s).   Specialties: General Surgery, Surgery Why: Post-op day 9 right colectomy (remove staples) Contact information: [address] [place] [address] [phone]                  Time spent on discharge management including discussion of hospital course, clinical condition, outpatient instructions, prescriptions, and follow up with the patient and members of the medical team: >30 minutes  Aaren Krog Barrientos PA-C

## 2024-06-21 ENCOUNTER — Telehealth: Payer: Self-pay

## 2024-06-21 NOTE — Transitions of Care (Post Inpatient/ED Visit) (Signed)
 06/21/2024  Name: Robin Bass MRN: [id number] DOB: [birth year]  Today's TOC FU Call Status: Today's TOC FU Call Status:: Successful TOC FU Call Completed TOC FU Call Complete Date: 06/21/24 Patient's Name and Date of Birth confirmed.  Transition Care Management Follow-up Telephone Call Date of Discharge: 06/20/24 Discharge Facility: [facility]) Type of Discharge: Inpatient Admission Primary Inpatient Discharge Diagnosis:: colon cancer How have you been since you were released from the hospital?: Same Any questions or concerns?: No  Items Reviewed: Did you receive and understand the discharge instructions provided?: Yes Medications obtained,verified, and reconciled?: Yes (Medications Reviewed) Any new allergies since your discharge?: No Dietary orders reviewed?: Yes Type of Diet Ordered:: Low salt heart healthy Do you have support at home?: Yes People in Home [RPT]: spouse Name of Support/Comfort Primary Source: Camil Hausmann  Medications Reviewed Today: Medications Reviewed Today     Reviewed by [place] Dube E, RN (Registered Nurse) on 06/21/24 at [phone]  Med List Status: <None>   Medication Order Taking? Sig Documenting Provider Last Dose Status Informant  albuterol  (VENTOLIN  HFA) 108 (90 Base) MCG/ACT inhaler [id number]  Inhale 2 puffs into the lungs every 4 (four) hours as needed for wheezing.  Patient not taking: Reported on 06/21/2024   Randeen Laine LABOR, MD  Active Self  amLODipine  (NORVASC ) 10 MG tablet [id number] Yes Take 1 tablet (10 mg total) by mouth at bedtime. Copland, Jacques, MD  Active Self  Ascorbic Acid (VITAMIN C) 1000 MG tablet [id number]  Take 1,000 mg by mouth daily.  Patient not taking: Reported on 06/21/2024   [provider]  Active Self  atorvastatin  (LIPITOR) 20 MG tablet [id number] Yes TAKE ONE TABLET BY MOUTH ONCE DAILY Copland, Spencer, MD  Active Self  Cholecalciferol (VITAMIN D ) 50 MCG (2000 UT) CAPS [id number]  Take  2,000 Units by mouth daily.  Patient not taking: Reported on 06/21/2024   [provider]  Active Self           Med Note SOILA LYLE BROCKS   Fri May 31, 2024  4:04 PM) ON HOLD  clindamycin (CLEOCIN T) 1 % lotion [id number]  Apply 1 Application topically daily as needed (irritation).  Patient not taking: Reported on 06/21/2024   [provider]  Active Self  clonazePAM  (KLONOPIN ) 0.5 MG tablet [id number] Yes TAKE ONE TABLET (0.5 MG TOTAL) BY MOUTH TWO TIMES DAILY AS NEEDED FOR ANXIETY. Copland, Jacques, MD  Active Self  cyanocobalamin  (VITAMIN B12) 1000 MCG tablet [id number]  Take 1,000 mcg by mouth daily.  Patient not taking: Reported on 06/21/2024   [provider]  Active Self  ferrous sulfate 324 MG TBEC [id number]  Take 324 mg by mouth.  Patient not taking: Reported on 06/21/2024   [provider]  Active Self  FLUoxetine  (PROZAC ) 20 MG capsule [id number] Yes TAKE ONE CAPSULE BY MOUTH ONCE DAILY Copland, Spencer, MD  Active Self  HYDROcodone -acetaminophen  (NORCO/VICODIN) 5-325 MG tablet [id number] Yes Take 1 tablet by mouth every 8 (eight) hours as needed for up to 3 days. [address], [place], PA-C  Active   levothyroxine  (SYNTHROID ) 50 MCG tablet [id number] Yes TAKE ONE TAB BY MOUTH ONCE DAILY. TAKE ON AN EMPTY STOMACH WITH A GLASS OF WATER ATLEAST 30-60 MINUTES BEFORE BREAKFAST Copland, Spencer, MD  Active Self  losartan -hydrochlorothiazide  (HYZAAR) 100-25 MG tablet [id number] Yes TAKE ONE TABLET BY MOUTH ONCE DAILY Copland, Spencer, MD  Active Self  meloxicam  (MOBIC ) 15 MG tablet [id number] Yes Take 1 tablet (15  mg total) by mouth daily. Copland, Jacques, MD  Active Self  metFORMIN  (GLUCOPHAGE -XR) 500 MG 24 hr tablet [id number] Yes TAKE ONE TABLET (500 MG TOTAL) BY MOUTH DAILY WITH BREAKFAST. Watt Jacques, MD  Active Self  ONE TOUCH ULTRA TEST test strip [id number] Yes USE TO CHECK BLOOD SUGAR TWICE A DAY AND AS DIRECTED Copland, Spencer, MD  Active Self   [facility] DELICA LANCETS 33G MISC [id number] Yes Use to check blood sugar two times a day Copland, Spencer, MD  Active Self  potassium chloride  SA (KLOR-CON  M) 20 MEQ tablet [id number]  Take 1 tablet (20 mEq total) by mouth daily.  Patient not taking: Reported on 06/21/2024   Babara Call, MD  Active Self  Vitamin D , Cholecalciferol, 25 MCG (1000 UT) TABS [id number]  Take by mouth. Takes 1/2  Patient not taking: Reported on 06/21/2024   [provider]  Active Self            Home Care and Equipment/Supplies: Were Home Health Services Ordered?: No Any new equipment or medical supplies ordered?: No  Functional Questionnaire: Do you need assistance with bathing/showering or dressing?: No Do you need assistance with meal preparation?: No Do you need assistance with eating?: No Do you have difficulty maintaining continence: No Do you need assistance with getting out of bed/getting out of a chair/moving?: No Do you have difficulty managing or taking your medications?: No  Follow up appointments reviewed: PCP Follow-up appointment confirmed?: Yes Date of PCP follow-up appointment?: 07/08/24 Follow-up Provider: Dr. Jacques Copland Do you need transportation to your follow-up appointment?: No Do you understand care options if your condition(s) worsen?: Yes-patient verbalized understanding  SDOH Interventions Today    Flowsheet Row Most Recent Value  SDOH Interventions   Food Insecurity Interventions Intervention Not Indicated  Housing Interventions Intervention Not Indicated  Transportation Interventions Intervention Not Indicated  Utilities Interventions Intervention Not Indicated   Discussed and offered 30 day TOC program.  Patient declined.  The patient has been provided with contact information for the care management team and has been advised to call with any health -related questions or concerns.  The patient verbalized understanding with current plan of care.  The patient is  directed to their insurance card regarding availability of benefits coverage.    Arvin Seip RN, BSN, CCM [employer], Population Health Case Manager Phone: [phone]

## 2024-06-21 NOTE — Patient Instructions (Signed)
 Visit Information  Thank you for taking time to visit with me today. Please don't hesitate to contact me if I can be of assistance to you   Patient instructions:  No heavy lifting >20 pounds (children, pets, laundry, garbage) or strenuous activity until follow-up, but light activity and walking are encouraged. Do not drive or drink alcohol if taking narcotic pain medications. Wound care: Remove dressing tomorrow. Once dressing removed, may shower with soapy water Keep follow up visits with provider Take medications as prescribed Notify provider for any ongoing/ new symptoms Call 911 for severe symptoms such as shortness of breath/ chest pain  Patient verbalizes understanding of instructions and care plan provided today and agrees to view in MyChart. Active MyChart status and patient understanding of how to access instructions and care plan via MyChart confirmed with patient.     The patient has been provided with contact information for the care management team and has been advised to call with any health related questions or concerns.   Please call the care guide team at [phone] if you need to cancel or reschedule your appointment.   Please call the Suicide and Crisis Lifeline: 988 call the [place]  National Suicide Prevention Lifeline: 484-216-2674 or TTY: 854 296 4805 TTY 571-311-0841) to talk to a trained counselor call 1-800-273-TALK (toll free, 24 hour hotline) go to [facility] [address] [place] [phone]) if you are experiencing a Mental Health or Behavioral Health Crisis or need someone to talk to.  Arvin Seip RN, BSN, CCM [employer], Population Health Case Manager Phone: [phone]

## 2024-06-26 ENCOUNTER — Other Ambulatory Visit: Payer: Self-pay

## 2024-06-26 ENCOUNTER — Encounter: Payer: Self-pay | Admitting: Oncology

## 2024-06-26 ENCOUNTER — Inpatient Hospital Stay: Admitting: Oncology

## 2024-06-26 ENCOUNTER — Inpatient Hospital Stay: Attending: Oncology | Admitting: Hospice and Palliative Medicine

## 2024-06-26 VITALS — BP 123/59 | HR 84 | Temp 99.4°F | Resp 16 | Wt 157.2 lb

## 2024-06-26 DIAGNOSIS — E876 Hypokalemia: Secondary | ICD-10-CM | POA: Diagnosis not present

## 2024-06-26 DIAGNOSIS — Z809 Family history of malignant neoplasm, unspecified: Secondary | ICD-10-CM | POA: Insufficient documentation

## 2024-06-26 DIAGNOSIS — Z79899 Other long term (current) drug therapy: Secondary | ICD-10-CM | POA: Diagnosis not present

## 2024-06-26 DIAGNOSIS — D509 Iron deficiency anemia, unspecified: Secondary | ICD-10-CM | POA: Diagnosis not present

## 2024-06-26 DIAGNOSIS — C184 Malignant neoplasm of transverse colon: Secondary | ICD-10-CM | POA: Insufficient documentation

## 2024-06-26 NOTE — Assessment & Plan Note (Signed)
 Pathology results were reviewed and discussed with patient and daughter. 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  No role of adjuvant chemotherapy. Recommend patient to get Signatera testing done in a month.  Follow-up in 4 months.

## 2024-06-26 NOTE — Progress Notes (Signed)
 Hematology/Oncology Consult Note [phone] [phone]     REFERRING PROVIDER: Watt Mirza, MD    CHIEF COMPLAINTS/PURPOSE OF CONSULTATION:  Transverse colon cancer  ASSESSMENT & PLAN:   Cancer Staging  Cancer of transverse colon [facility]) Staging form: Colon and Rectum, AJCC 8th Edition - Clinical stage from 06/04/2024: Stage Unknown (cTX, cN0, cM0) - Signed by Babara Call, MD on 06/04/2024 - Pathologic stage from 06/26/2024: Stage IIA (pT3, pN0, cM0) - Signed by Babara Call, MD on 06/26/2024    Cancer of transverse colon [facility]) Pathology results were reviewed and discussed with patient and daughter. 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  No role of adjuvant chemotherapy. Recommend patient to get Signatera testing done in a month.  Follow-up in 4 months.  Hypokalemia Resolved.  Family history of cancer Discussed about option of genetic testing.  She is undecided    Orders Placed This Encounter  Procedures   Signatera    Select as applicable. If patient is on or planning to receive immunotherapy, select drug: Not on Immunotherapy If Other or Multiple, Write down drug name:  Do not Delete Below This Line   ==========Department Information========== ID: [id number] Department:[facility] - A DEPT OF [facility] [address] [place] [address] Dept: [phone] Dept Fax: [phone]    Standing Status:   Standing    Number of Occurrences:   7    Expiration Date:   06/26/2025    Surveillance Program draw frequency::   Every 3 Months    Surveillance Program draw count::   8    Cancer type::   Colon    Stage of diagnosis::   II    By placing this electronic order I confirm the testing ordered herein is medically necessary and this patient has been informed of the details of  the genetic test(s) ordered, including the risks, benefits, and alternatives, and has consented to testing.:   Yes    What type of billing?:   Bill Insurance   CBC with Differential (Cancer Center Only)    Standing Status:   Future    Expected Date:   10/26/2024    Expiration Date:   01/24/2025   CMP (Cancer Center only)    Standing Status:   Future    Expected Date:   10/26/2024    Expiration Date:   01/24/2025   CEA    Standing Status:   Future    Expected Date:   10/26/2024    Expiration Date:   01/24/2025   Genetic Screening Order    Standing Status:   Future    Expected Date:   10/26/2024    Expiration Date:   01/24/2025   Genetic Screening Order    Standing Status:   Future    Expected Date:   07/26/2024    Expiration Date:   06/26/2025   Follow-up per LOS All questions were answered. The patient knows to call the clinic with any problems, questions or concerns.  Call Babara, MD, PhD [facility] Hematology Oncology 06/26/2024    HISTORY OF PRESENTING ILLNESS:  Robin Bass 73 y.o. female presents to establish care for transverse colon cancer. I have reviewed her chart and materials related to her cancer extensively and collaborated history with the patient. Summary of oncologic history is as follows: Oncology History  Cancer of transverse colon (  HCC)  05/28/2024 Imaging   CT chest abdomen pelvis with contrast showed  1. Asymmetric wall thickening of the proximal transverse colon with pericolonic inflammation, consistent with recent colon cancer diagnosis. Given the adjacent inflammation, this is worrisome for early transmural extension. Follow up colonoscopy recommended for confirmation. 2. Numerous subcentimeter hypodensities throughout the spleen possibly small cyst or hemangiomas. Nonemergent multiphase abdominal MRI with iv contrast recommended for further characterization to exclude underlying neoplasm or metastatic disease. 3. No intraabdominal or pelvic lymphadenopathy. No  distant metastatic disease within the chest. 4. Scattered colonic diverticulosis without evidence of diverticulitis   06/04/2024 Initial Diagnosis   Cancer of transverse colon  Patient was found to have New onset of microcytic anemia. Subsequent workup including Cologuard was positive. 05/31/2024, colonoscopy showed 270 pedunculated polyps in the sigmoid colon.  Resected and retrieved.  Infiltrative sessile and ulcerated partially obstructing large mass in the proximal transverse colon.  Mass was partially circumferential, no bleeding was present.  Mass was biopsied.  Pathology showed - Proximal transverse colon mass-ulcerated moderately differentiated adenocarcinoma. - Sigmoid polyps -tubular adenoma.  Negative for high-grade dysplasia and malignancy.  Pathology showed      06/04/2024 Cancer Staging   Staging form: Colon and Rectum, AJCC 8th Edition - Clinical stage from 06/04/2024: Stage Unknown (cTX, cN0, cM0) - Signed by Babara Call, MD on 06/04/2024 Stage prefix: Initial diagnosis Total positive nodes: 0   06/17/2024 Surgery   Status post colon cancer resection.  Pathology showed  Colon, segmental resection for tumor, right - INVASIVE ADENOCARCINOMA, MODERATELY DIFFERENTIATED, 6.4 CM, INVADING PERICOLONIC ADIPOSE TISSUE (SEE SYNOPTIC REPORT). - MARGINS NEGATIVE FOR CARCINOMA OR DYSPLASIA. - THREE TUBULAR ADENOMAS AND ONE HYPERPLASTIC POLYP. - NINETEEN LYMPH NODES, ALL NEGATIVE FOR CARCINOMA (0/19). - APPENDIX WITH FIBROUS OBLITERATION OF THE LUMEN AND ENDOMETRIOSIS.  pMMR  TUMOR Tumor Site: Transverse colon, proximal Histologic Type: Adenocarcinoma Histologic Grade: G2, moderately differentiated Tumor Size: Greatest dimension: 6.4 cm Multiple Primary Sites: Not applicable (no additional primary sites present) Tumor Extent: Invades through muscularis propria into the pericolic or perirectal tissue Sub-mucosal Invasion: Not applicable (not a pT1 tumor) Macroscopic Tumor  Perforation: Not identified Lymphatic and/or Vascular Invasion: Not identified Perineural Invasion: Not identified Tumor Budding Score: Intermediate (5-9) Number of tumor buds (per hotspot field): 7 Treatment Effect: No known presurgical therapy  MARGINS Margin Status for Invasive Carcinoma: All margins negative for invasive carcinoma Margin Status for Non-Invasive Tumor: All margins negative for high-grade dysplasia/intramucosal carcinoma and low-grade dysplasia REGIONAL LYMPH NODES Regional Lymph Node Status: Regional lymph nodes present All regional lymph nodes negative for tumor - Number of lymph nodes examined: 19 Tumor Deposits: Not identified DISTANT METASTASES Distant Site(s) Involved, if applicable: Not applicable PATHOLOGIC STAGE CLASSIFICATION (pTNM, AJCC 8th Edition): Modified Classification: Not applicable pT3 - T suffix: Not applicable pN0 pM not applicable ADDITIONAL FINDINGS Adenomas     06/26/2024 Cancer Staging   Staging form: Colon and Rectum, AJCC 8th Edition - Pathologic stage from 06/26/2024: Stage IIA (pT3, pN0, cM0) - Signed by Babara Call, MD on 06/26/2024 Stage prefix: Initial diagnosis Total positive nodes: 0   Patient was accompanied by daughter  Status post colon surgery.  She reports feeling well today.  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    SURGICAL HISTORY: Past Surgical History:  Procedure Laterality Date   ABDOMINAL HYSTERECTOMY  2005   MOUTH SURGERY  2005   floor of  the mouth, tumor removed    SOCIAL HISTORY: Social History   Socioeconomic History   Marital status: Married    Spouse name: Not on file   Number of children: 1   Years of education: Not on file   Highest education level: Not on file  Occupational History   Occupation: Producer, television/film/video: UNEMPLOYED  Tobacco Use   Smoking status: Never   Smokeless tobacco: Never  Vaping Use   Vaping status: Never Used  Substance and Sexual Activity   Alcohol use: No   Drug use: No   Sexual activity: Not on file  Other Topics Concern   Not on file  Social History Narrative   Not on file   Social Drivers of Health   Financial Resource Strain: Low Risk  (05/28/2024)   [facility]   Overall Financial Resource Strain (CARDIA)    Difficulty of Paying Living Expenses: Not hard at all  Food Insecurity: No Food Insecurity (06/21/2024)   Hunger Vital Sign    Worried About Running Out of Food in the Last Year: Never true    Ran Out of Food in the Last Year: Never true  Transportation Needs: No Transportation Needs (06/21/2024)   PRAPARE - Administrator, Civil Service (Medical): No    Lack of Transportation (Non-Medical): No  Physical Activity: Inactive (02/27/2024)   Exercise Vital Sign    Days of Exercise per Week: 0 days    Minutes of Exercise per Session: 0 min  Stress: Stress Concern Present (02/27/2024)   [employer] of Occupational Health - Occupational Stress Questionnaire    Feeling of Stress : To some extent  Social Connections: Moderately Isolated (06/17/2024)   Social Connection and Isolation Panel    Frequency of Communication with Friends and Family: More than three times a week    Frequency of Social Gatherings with Friends and Family: More than three times a week    Attends Religious Services: Never    Database administrator or Organizations: No    Attends Banker Meetings: Never    Marital Status: Married  Careers information officer Violence: Not At Risk (06/21/2024)   Humiliation, Afraid, Rape, and Kick questionnaire    Fear of Current or Ex-Partner: No    Emotionally Abused: No    Physically Abused: No    Sexually Abused: No    FAMILY HISTORY: Family History  Problem Relation Age of Onset   Hypertension Mother    Heart attack Father    Basal cell carcinoma Father    Cancer Father    Breast cancer Neg Hx     ALLERGIES:  is allergic to sulfonamide derivatives and ace inhibitors.  MEDICATIONS:  Current Outpatient Medications  Medication Sig Dispense Refill   amLODipine  (NORVASC ) 10 MG tablet Take 1 tablet (10 mg total) by mouth at bedtime. 90 tablet 1   atorvastatin  (LIPITOR) 20 MG tablet TAKE ONE TABLET BY MOUTH ONCE DAILY 100 tablet 2   clonazePAM  (KLONOPIN ) 0.5 MG tablet TAKE  ONE TABLET (0.5 MG TOTAL) BY MOUTH TWO TIMES DAILY AS NEEDED FOR ANXIETY. 30 tablet 1   FLUoxetine  (PROZAC ) 20 MG capsule TAKE ONE CAPSULE BY MOUTH ONCE DAILY 90 capsule 1   levothyroxine  (SYNTHROID ) 50 MCG tablet TAKE ONE TAB BY MOUTH ONCE DAILY. TAKE ON AN EMPTY STOMACH WITH A GLASS OF WATER ATLEAST 30-60 MINUTES BEFORE BREAKFAST 90 tablet 3   losartan -hydrochlorothiazide  (HYZAAR) 100-25 MG tablet TAKE ONE TABLET BY MOUTH ONCE DAILY 90 tablet 1   meloxicam  (MOBIC ) 15 MG tablet Take 1 tablet (15 mg total) by mouth daily. 30 tablet 3   metFORMIN  (GLUCOPHAGE -XR) 500 MG 24 hr tablet TAKE ONE TABLET (500 MG TOTAL) BY MOUTH DAILY WITH BREAKFAST. 90 tablet 1   ONE TOUCH ULTRA TEST test strip USE TO CHECK BLOOD SUGAR TWICE A DAY AND AS DIRECTED 100 each 6   ONETOUCH DELICA LANCETS 33G MISC Use to check blood sugar two times a day 100 each 11   albuterol  (VENTOLIN  HFA) 108 (90 Base) MCG/ACT inhaler Inhale 2 puffs into the lungs every 4 (four) hours as needed for wheezing. (Patient not taking: Reported on 06/26/2024) 1 each 0   Ascorbic Acid (VITAMIN C) 1000 MG tablet Take 1,000 mg by mouth daily. (Patient not taking: Reported on 06/26/2024)      Cholecalciferol (VITAMIN D ) 50 MCG (2000 UT) CAPS Take 2,000 Units by mouth daily. (Patient not taking: Reported on 06/26/2024)     clindamycin (CLEOCIN T) 1 % lotion Apply 1 Application topically daily as needed (irritation). (Patient not taking: Reported on 06/26/2024)     cyanocobalamin  (VITAMIN B12) 1000 MCG tablet Take 1,000 mcg by mouth daily. (Patient not taking: Reported on 06/26/2024)     ferrous sulfate 324 MG TBEC Take 324 mg by mouth. (Patient not taking: Reported on 06/26/2024)     potassium chloride  SA (KLOR-CON  M) 20 MEQ tablet Take 1 tablet (20 mEq total) by mouth daily. (Patient not taking: Reported on 06/26/2024) 3 tablet 0   Vitamin D , Cholecalciferol, 25 MCG (1000 UT) TABS Take by mouth. Takes 1/2 (Patient not taking: Reported on 06/26/2024)     No current facility-administered medications for this visit.    Review of Systems  Constitutional:  Positive for fatigue. Negative for appetite change, chills and fever.  HENT:   Negative for hearing loss and voice change.   Eyes:  Negative for eye problems.  Respiratory:  Negative for chest tightness and cough.   Cardiovascular:  Negative for chest pain.  Gastrointestinal:  Negative for abdominal distention, abdominal pain and blood in stool.  Endocrine: Negative for hot flashes.  Genitourinary:  Negative for difficulty urinating and frequency.   Musculoskeletal:  Negative for arthralgias.  Skin:  Negative for itching and rash.  Neurological:  Negative for extremity weakness.  Hematological:  Negative for adenopathy.  Psychiatric/Behavioral:  Negative for confusion.      PHYSICAL EXAMINATION: ECOG PERFORMANCE STATUS: 1 - Symptomatic but completely ambulatory  Vitals:   06/26/24 1259  BP: (!) 123/59  Pulse: 84  Resp: 16  Temp: 99.4 F (37.4 C)  SpO2: 97%   Filed Weights   06/26/24 1259  Weight: 157 lb 3.2 oz (71.3 kg)    Physical Exam Constitutional:      General: She is not in acute distress.    Appearance: She is  not diaphoretic.  HENT:     Head: Normocephalic and atraumatic.     Mouth/Throat:     Pharynx: No oropharyngeal exudate.  Eyes:  General: No scleral icterus.    Pupils: Pupils are equal, round, and reactive to light.  Cardiovascular:     Rate and Rhythm: Normal rate and regular rhythm.     Heart sounds: No murmur heard. Pulmonary:     Effort: Pulmonary effort is normal. No respiratory distress.     Breath sounds: No rales.  Chest:     Chest wall: No tenderness.  Abdominal:     General: There is no distension.     Palpations: Abdomen is soft.     Tenderness: There is no abdominal tenderness.  Musculoskeletal:        General: Normal range of motion.     Cervical back: Normal range of motion and neck supple.  Skin:    General: Skin is warm and dry.     Findings: No erythema.  Neurological:     Mental Status: She is alert and oriented to person, place, and time.     Cranial Nerves: No cranial nerve deficit.     Motor: No abnormal muscle tone.     Coordination: Coordination normal.  Psychiatric:        Mood and Affect: Affect normal.      LABORATORY DATA:  I have reviewed the data as listed    Latest Ref Rng & Units 06/19/2024    5:20 AM 06/18/2024    4:14 AM 06/17/2024    7:13 PM  CBC  WBC 4.0 - 10.5 K/uL 9.3  10.3  12.7   Hemoglobin 12.0 - 15.0 g/dL 9.9  9.4  9.9   Hematocrit 36.0 - 46.0 % 32.6  30.4  31.9   Platelets 150 - 400 K/uL 348  366  340       Latest Ref Rng & Units 06/19/2024    5:20 AM 06/18/2024    4:14 AM 06/17/2024    7:13 PM  CMP  Glucose 70 - 99 mg/dL 884  802    BUN 8 - 23 mg/dL 11  14    Creatinine 9.55 - 1.00 mg/dL 9.23  9.12  9.03   Sodium 135 - 145 mmol/L 141  138    Potassium 3.5 - 5.1 mmol/L 4.0  3.5    Chloride 98 - 111 mmol/L 105  108    CO2 22 - 32 mmol/L 28  24    Calcium  8.9 - 10.3 mg/dL 9.0  8.6       RADIOGRAPHIC STUDIES: I have personally reviewed the radiological images as listed and agreed with the findings in the  report. MR Abdomen W Wo Contrast Result Date: 06/11/2024 CLINICAL DATA:  Colon cancer, abnormal spleen EXAM: MRI ABDOMEN WITHOUT AND WITH CONTRAST TECHNIQUE: Multiplanar multisequence MR imaging of the abdomen was performed both before and after the administration of intravenous contrast. CONTRAST:  7mL GADAVIST  GADOBUTROL  1 MMOL/ML IV SOLN COMPARISON:  05/28/2024 FINDINGS: Lower chest: Borderline cardiomegaly. Hepatobiliary: 4 mm cyst of the right hepatic lobe on image 25 series 3. No worrisome hepatic lesions. Gallbladder unremarkable. Pancreas:  Unremarkable Spleen: Scattered subcentimeter T2 hyperintense lesions throughout the spleen which appear did demonstrate progressive and delayed enhancement. No splenomegaly to suggest lymphoproliferative process. Possibilities include hemangiomas, Littoral cell angioma, splenic peliosis, or less likely infiltrative infectious process such as histoplasmosis. Sarcoidosis, lymphoma, metastatic disease are less likely differential diagnostic considerations. 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 Tiny simple cyst of the right kidney upper pole warrants no further imaging workup. Stomach/Bowel: Abnormal wall thickening in the proximal transverse colon, image  27 series 3, compatible with malignancy. Vascular/Lymphatic:  Unremarkable Other:  No supplemental non-categorized findings. Musculoskeletal: Mild lumbar spondylosis and degenerative disc disease. IMPRESSION: 1. Abnormal wall thickening in the proximal transverse colon compatible with malignancy. 2. Scattered subcentimeter T2 hyperintense lesions throughout the spleen which appear to demonstrate progressive and delayed enhancement. Possibilities include hemangiomas, Littoral cell angioma, splenic peliosis, or less likely infiltrative infectious process such as histoplasmosis. Sarcoidosis, lymphoma, metastatic disease are less likely differential diagnostic considerations. 3. 4.0 by 3.0 cm left  adrenal myelolipoma. 4. Borderline cardiomegaly. 5. Mild lumbar spondylosis and degenerative disc disease. Electronically Signed   By: Ryan Salvage M.D.   On: 06/11/2024 11:06   CT CHEST ABDOMEN PELVIS W CONTRAST Result Date: 05/28/2024 EXAM: CT CHEST, ABDOMEN AND PELVIS WITH CONTRAST 05/28/2024 11:12:28 AM TECHNIQUE: CT of the chest, abdomen and pelvis was performed with the administration of intravenous contrast. Multiplanar reformatted images are provided for review. Automated exposure control, iterative reconstruction, and/or weight based adjustment of the mA/kV was utilized to reduce the radiation dose to as low as reasonably achievable. COMPARISON: 10/31/2023 CLINICAL HISTORY: Recent colon cancer diagnosis. FINDINGS: CHEST: MEDIASTINUM: Heart and pericardium are unremarkable. The central airways are clear. THORACIC LYMPH NODES: No mediastinal, hilar or axillary lymphadenopathy. LUNGS AND PLEURA: No focal consolidation or pulmonary edema. No pleural effusion or pneumothorax. ABDOMEN AND PELVIS: LIVER: Focal fatty infiltration along the falciform ligament. No mass. No intrahepatic or extrahepatic biliary ductal dilation. The portal veins are patent. GALLBLADDER AND BILE DUCTS: Gallbladder is unremarkable. No biliary ductal dilatation. SPLEEN: Innumerable subcentimeter hypodensities throughout the spleen, which is not enlarged. PANCREAS: Mild fatty infiltration of the pancreatic head and uncinate process. No pancreatic ductal dilation. 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 KIDNEYS, URETERS AND BLADDER: A few small subcentimeter hypodensities noted in both kidneys, too small to definitively characterize, but likely small cysts. No stones in the kidneys or ureters. No hydronephrosis. No perinephric or periureteral stranding. Urinary bladder is unremarkable. GI AND BOWEL: Small amounts of ingested material layering in the stomach. There is no bowel obstruction. Normal appendix.  Asymmetric wall thickening of the proximal transverse colon with pericolonic inflammation (axial 69). This measures 4.9 cm in length ( sagittal 71 ). Scattered colonic diverticulosis. REPRODUCTIVE ORGANS: Hysterectomy. PERITONEUM AND RETROPERITONEUM: No ascites. No free air. VASCULATURE: Diffuse aortoiliac atherosclerosis without hemodynamically significant significant stenosis. ABDOMINAL AND PELVIS LYMPH NODES: No lymphadenopathy. BONES AND SOFT TISSUES: Multilevel degenerative disc disease throughout the thoracolumbar spine. Mild, grade 1 anterolisthesis of L3 on L4, likely degenerative. No focal soft tissue abnormality. IMPRESSION: 1. Asymmetric wall thickening of the proximal transverse colon with pericolonic inflammation, consistent with recent colon cancer diagnosis. Given the adjacent inflammation, this is worrisome for early transmural extension. Follow up colonoscopy recommended for confirmation. 2. Numerous subcentimeter hypodensities throughout the spleen possibly small cyst or hemangiomas. Nonemergent multiphase abdominal MRI with iv contrast recommended for further characterization to exclude underlying neoplasm or metastatic disease. 3. No intraabdominal or pelvic lymphadenopathy. No distant metastatic disease within the chest. 4. Scattered colonic diverticulosis without evidence of diverticulitis. Electronically signed by: Rogelia Myers MD 05/28/2024 12:30 PM EDT RP Workstation: [id number]

## 2024-06-26 NOTE — Progress Notes (Signed)
 Multidisciplinary Oncology Council Documentation  Robin Bass was presented by our [facility] on 06/26/2024, which included representatives from:  Palliative Care Dietitian  Physical/Occupational Therapist Nurse Navigator Genetics Social work Survivorship RN Financial Navigator Research RN   Ora currently presents with history of colon cancer  We reviewed previous medical and familial history, history of present illness, and recent lab results along with all available histopathologic and imaging studies. The MOC considered available treatment options and made the following recommendations/referrals:  Nutrition, SW  The MOC is a meeting of clinicians from various specialty areas who evaluate and discuss patients for whom a multidisciplinary approach is being considered. Final determinations in the plan of care are those of the provider(s).   Today's extended care, comprehensive team conference, Pennie was not present for the discussion and was not examined.

## 2024-06-26 NOTE — Assessment & Plan Note (Signed)
 Resolved

## 2024-06-26 NOTE — Assessment & Plan Note (Signed)
 Discussed about option of genetic testing.  She is undecided

## 2024-07-03 ENCOUNTER — Telehealth: Payer: Self-pay

## 2024-07-03 NOTE — Telephone Encounter (Signed)
 Clinical Social Work was referred by [facility] for assessment of psychosocial needs.  CSW attempted to contact patient by phone.  There was no answer.  Will attempt at another time.

## 2024-07-07 NOTE — Progress Notes (Signed)
 Robin Bass T. Rylea Selway, MD, CAQ Sports Medicine [facility] at [facility] [address] [place], [address]  Phone: [phone]  FAX: [phone]  Robin Bass - 73 y.o. female  MRN [id number]  Date of Birth: 10-03-1951  Date: 07/08/2024  PCP: Watt Mirza, MD  Referral: Watt Mirza, MD  Chief Complaint  Patient presents with   Medical Management of Chronic Issues   Subjective:   Robin Bass is a 73 y.o. very pleasant female patient with Body mass index is 31.1 kg/m. who presents with the following:  Discussed the use of AI scribe software for clinical note transcription with the patient, who gave verbal consent to proceed.  Patient is here for diabetes follow-up.  In the interval time since I have seen the patient she has been diagnosed with a transverse colon cancer.  She has had a partial colectomy.  Diabetes Mellitus: Tolerating Medications: yes Compliance with diet: fair, Body mass index is 31.1 kg/m. Exercise: minimal / intermittent Avg blood sugars at home: not checking Foot problems: none Hypoglycemia: none No nausea, vomitting, blurred vision, polyuria.  Lab Results  Component Value Date   HGBA1C 7.7 (A) 07/08/2024   HGBA1C 7.7 (H) 03/28/2024   HGBA1C 7.1 (A) 10/26/2023   Lab Results  Component Value Date   MICROALBUR 0.9 03/28/2024   LDLCALC 63 03/28/2024   CREATININE 0.76 06/19/2024    Wt Readings from Last 3 Encounters:  07/08/24 154 lb (69.9 kg)  06/26/24 157 lb 3.2 oz (71.3 kg)  06/17/24 161 lb 6.4 oz (73.2 kg)    History of Present Illness Robin Bass is a 73 year old female with colon cancer who presents for follow-up after partial colectomy and primary follow-up regarding diabetes.  Robin Bass underwent a partial colectomy on June 17, 2024, for colon cancer. The surgery was successful, and she does not have a colostomy as the surgical team was able to reconnect her colon.  Post-surgery, her oncologist recommended blood work every six months and a CT scan to monitor for any recurrence. She did not require chemotherapy following the surgery.  She mentions having lesions on her spleen, which were investigated with an MRI. The results indicated issues with her spleen, but it was not cancerous -they radiology feels like this is less likely. The lesions were identified as possible hemangiomas or angiomas, with differential diagnoses including histoplasmosis or sarcoidosis. Her oncologist plans to monitor these findings with regular blood work.  She is experiencing significant fatigue and low energy levels. She was previously taking oral iron but stopped all vitamins and supplements before her surgery. She wants to resume oral iron to manage her anemia and improve her energy levels.  She has a history of type 2 diabetes and reports that her blood sugar levels have been elevated, with a recent reading of 7.7%. She was unable to take metformin  consistently around the time of her surgery, missing approximately six doses. She is currently taking metformin  again and notes that her blood sugar levels have been higher due to stress related to her cancer diagnosis. She has lost weight, now weighing 154 pounds, down from around 200 pounds.  She also mentions a history of low potassium, for which she was given potassium supplements. She recalls her potassium levels being slightly low about a month ago.  She notes some hardness at the incision site, which she does not believe to be a hernia.    Review of Systems is noted in  the HPI, as appropriate  Objective:   BP (!) 118/54   Pulse 94   Temp 97.7 F (36.5 C) (Temporal)   Ht 4' 11 (1.499 m)   Wt 154 lb (69.9 kg)   SpO2 95%   BMI 31.10 kg/m   GEN: No acute distress; alert,appropriate. PULM: Breathing comfortably in no respiratory distress CV: RRR, no m/g/r  PSYCH: Normally interactive.   Physical Exam MEASUREMENTS:  Weight- 154. ABDOMEN: Surgical scar on abdomen, one large and three small, appears normal, no hernia. There is some firmness in 1 location about 2 cm across adjacent to the scar  Laboratory and Imaging Data:  Assessment and Plan:     ICD-10-CM   1. Diabetes mellitus treated with oral medication (HCC)  E11.9 POCT glycosylated hemoglobin (Hb A1C)   Z79.84     2. Cancer of transverse colon (HCC)  C18.4     3. Essential hypertension  I10      Assessment & Plan Status post partial colectomy for colon cancer, under surveillance Status post partial colectomy for colon cancer. No chemotherapy required. Under surveillance with biannual blood work and CT scans to monitor for recurrence. She plans to follow the surveillance plan as recommended by her oncologist. - Continue biannual blood work and CT scans for cancer surveillance.  Iron deficiency anemia Iron deficiency anemia with hemoglobin at 9.9 g/dL. Recent oral iron improved hemoglobin levels. Awaiting hematology's response regarding resumption of oral iron post-surgery. She prefers oral iron supplementation over IV infusions. - Contact hematology to confirm if oral iron can be resumed post-surgery.  Their notes mention IV iron.  Type 2 diabetes mellitus Type 2 diabetes mellitus with recent HbA1c of 7.7%. Blood sugar management disrupted due to surgery and stress. Currently on metformin , previously missed doses due to surgical preparation. She has resumed metformin  and is advised to increase the dose to improve glycemic control. - Increase metformin  to two tablets daily. - Send prescription for increased metformin  quantity to pharmacy.  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 Less likely to be cancerous.   Follow-Up Follow-up plan discussed for ongoing management and surveillance. She is scheduled for a follow-up appointment  in six months to reassess her condition and treatment plan. - Schedule follow-up appointment in six months.  Medication Management during today's office visit: Meds ordered this encounter  Medications   metFORMIN  (GLUCOPHAGE -XR) 500 MG 24 hr tablet    Sig: Take 2 tablets (1,000 mg total) by mouth daily with breakfast.    Dispense:  180 tablet    Refill:  1   Medications Discontinued During This Encounter  Medication Reason   metFORMIN  (GLUCOPHAGE -XR) 500 MG 24 hr tablet     Orders placed today for conditions managed today: Orders Placed This Encounter  Procedures   POCT glycosylated hemoglobin (Hb A1C)    Disposition: Return in about 6 months (around 01/05/2025) for diabetes.  Dragon [employer] One speech-to-text software was used for transcription in this dictation.  Possible transcriptional errors can occur using Animal nutritionist.   Signed,  Jacques DASEN. Lathon Adan, MD   Outpatient Encounter Medications as of 07/08/2024  Medication Sig   albuterol  (VENTOLIN  HFA) 108 (90 Base) MCG/ACT inhaler Inhale 2 puffs into the lungs every 4 (four) hours as needed for wheezing.   amLODipine  (NORVASC ) 10 MG tablet Take 1 tablet (10 mg total) by mouth at bedtime.   Ascorbic Acid (VITAMIN C) 1000 MG tablet Take 1,000 mg by  mouth daily.   atorvastatin  (LIPITOR) 20 MG tablet TAKE ONE TABLET BY MOUTH ONCE DAILY   Cholecalciferol (VITAMIN D ) 50 MCG (2000 UT) CAPS Take 2,000 Units by mouth daily.   clindamycin (CLEOCIN T) 1 % lotion Apply 1 Application topically daily as needed (irritation).   clonazePAM  (KLONOPIN ) 0.5 MG tablet TAKE ONE TABLET (0.5 MG TOTAL) BY MOUTH TWO TIMES DAILY AS NEEDED FOR ANXIETY.   cyanocobalamin  (VITAMIN B12) 1000 MCG tablet Take 1,000 mcg by mouth daily.   ferrous sulfate 324 MG TBEC Take 324 mg by mouth.   FLUoxetine  (PROZAC ) 20 MG capsule TAKE ONE CAPSULE BY MOUTH ONCE DAILY   levothyroxine  (SYNTHROID ) 50 MCG tablet TAKE ONE TAB BY MOUTH ONCE DAILY. TAKE ON AN EMPTY  STOMACH WITH A GLASS OF WATER ATLEAST 30-60 MINUTES BEFORE BREAKFAST   losartan -hydrochlorothiazide  (HYZAAR) 100-25 MG tablet TAKE ONE TABLET BY MOUTH ONCE DAILY   meloxicam  (MOBIC ) 15 MG tablet Take 1 tablet (15 mg total) by mouth daily.   ONE TOUCH ULTRA TEST test strip USE TO CHECK BLOOD SUGAR TWICE A DAY AND AS DIRECTED   ONETOUCH DELICA LANCETS 33G MISC Use to check blood sugar two times a day   potassium chloride  SA (KLOR-CON  M) 20 MEQ tablet Take 1 tablet (20 mEq total) by mouth daily.   Vitamin D , Cholecalciferol, 25 MCG (1000 UT) TABS Take by mouth. Takes 1/2   [DISCONTINUED] metFORMIN  (GLUCOPHAGE -XR) 500 MG 24 hr tablet TAKE ONE TABLET (500 MG TOTAL) BY MOUTH DAILY WITH BREAKFAST.   metFORMIN  (GLUCOPHAGE -XR) 500 MG 24 hr tablet Take 2 tablets (1,000 mg total) by mouth daily with breakfast.   No facility-administered encounter medications on file as of 07/08/2024.

## 2024-07-08 ENCOUNTER — Encounter: Payer: Self-pay | Admitting: Family Medicine

## 2024-07-08 ENCOUNTER — Ambulatory Visit (INDEPENDENT_AMBULATORY_CARE_PROVIDER_SITE_OTHER): Admitting: Family Medicine

## 2024-07-08 VITALS — BP 118/54 | HR 94 | Temp 97.7°F | Ht 59.0 in | Wt 154.0 lb

## 2024-07-08 DIAGNOSIS — I10 Essential (primary) hypertension: Secondary | ICD-10-CM

## 2024-07-08 DIAGNOSIS — Z7984 Long term (current) use of oral hypoglycemic drugs: Secondary | ICD-10-CM | POA: Diagnosis not present

## 2024-07-08 DIAGNOSIS — C184 Malignant neoplasm of transverse colon: Secondary | ICD-10-CM

## 2024-07-08 DIAGNOSIS — Z85038 Personal history of other malignant neoplasm of large intestine: Secondary | ICD-10-CM | POA: Diagnosis not present

## 2024-07-08 DIAGNOSIS — E119 Type 2 diabetes mellitus without complications: Secondary | ICD-10-CM | POA: Diagnosis not present

## 2024-07-08 LAB — POCT GLYCOSYLATED HEMOGLOBIN (HGB A1C): Hemoglobin A1C: 7.7 % — AB (ref 4.0–5.6)

## 2024-07-08 MED ORDER — METFORMIN HCL ER 500 MG PO TB24: 1000.0000 mg | ORAL_TABLET | Freq: Every day | ORAL | 1 refills | Status: AC

## 2024-07-09 ENCOUNTER — Telehealth: Payer: Self-pay | Admitting: Family Medicine

## 2024-07-09 NOTE — Telephone Encounter (Signed)
 Can you let her know that I heard back from Heme/Onc, and she is totally fine to start oral iron.  Iron Sulfate 325 mg a day

## 2024-07-09 NOTE — Telephone Encounter (Signed)
 Robin Bass notified as instructed by telephone.  Patient states understanding.

## 2024-07-12 ENCOUNTER — Encounter: Payer: Self-pay | Admitting: Family Medicine

## 2024-07-18 ENCOUNTER — Other Ambulatory Visit: Payer: Self-pay | Admitting: Family Medicine

## 2024-07-18 NOTE — Telephone Encounter (Signed)
 Last office visit 07/08/24 for DM.  Last refilled 04/04/2024 for #30 with 3 refills.  Next Appt: 01/06/2025 for 6 month follow up.

## 2024-07-24 ENCOUNTER — Inpatient Hospital Stay: Attending: Oncology

## 2024-07-24 NOTE — Progress Notes (Signed)
 [facility] Clinical Social Work  Clinical Social Work was referred by [facility] for assessment of psychosocial needs.  Clinical Social Worker contacted caregiver by phone to offer support and assess for needs.     Interventions: CSW spoke with patient's husband, Deward, because patient was not available.  Informed him of reason for the call.  He reported that patient had some physical discomfort.  CSW encouraged him to contact her medical team as needed and said he understood.      Follow Up Plan:  CSW will follow-up with patient by phone     Robin CHRISTELLA Au, LCSW  Clinical Social Worker [facility]

## 2024-07-25 ENCOUNTER — Inpatient Hospital Stay

## 2024-07-25 NOTE — Progress Notes (Signed)
 [facility] Clinical Social Work  Clinical Social Work was referred by [facility] for assessment of psychosocial needs.  Clinical Social Worker contacted patient by phone to offer support and assess for needs.     Interventions: Provided patient with information about CSW role, including, counseling, support groups and advance directives.  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  She expressed understanding of her follow up surveillance with Dr. Babara and has a good support system.       Follow Up Plan:  Patient will contact CSW with any support or resource needs    Macario CHRISTELLA Au, LCSW  Clinical Social Worker [facility]

## 2024-07-26 ENCOUNTER — Inpatient Hospital Stay

## 2024-07-26 DIAGNOSIS — C184 Malignant neoplasm of transverse colon: Secondary | ICD-10-CM

## 2024-07-26 LAB — GENETIC SCREENING ORDER

## 2024-07-30 ENCOUNTER — Inpatient Hospital Stay

## 2024-07-30 NOTE — Progress Notes (Signed)
 Nutrition Assessment:  MOC referral  73 year old female with colon cancer and iron deficiency anemia.  Past medical history of COPD, DM, GERD, depression.  No adjuvant chemotherapy.  Surveillance.    Met with patient in clinic.  Patient s/p robotic assisted right hemicolectomy on 8/25.  Reports that her appetite is returning following surgery.  Typical day is sausage biscuit and either doughnut or cinnamon roll or fruit.  Does not eat lunch but may have hand full of ritz crackers.  Dinner is chicken with macaroni and cheese or potato salad, sometimes steak, potato and salad.  Mainly drinks water and diet drinks.  Has loose stool and takes imodium 2-3 times a week.  Fe tablets cause patient to have diarrhea.      Medications: K CL, Vitamin C, Vit D, Fe Sulfate,   Labs: reviewed  Anthropometrics:   Height: 44ft 11 inches Weight: 154 lb today 180 lb about 1 year ago 165 lb prior to surgery (working to lose weight)  BMI: 31  6% weight loss in the last 6 weeks, concerning   NUTRITION DIAGNOSIS: Unintentional weight loss related to surgery as evidenced by 6% weight loss in 6 weeks and decreased intake    INTERVENTION:  Discussed trial of psyllium fiber (metamucil) to help with diarrhea Discussed importance of nutrition following intestinal surgery. Handout provided Contact information provided    NEXT VISIT: no follow-up RD available as needed  Robin Bass B. Dasie SOLON, CSO, LDN Registered Dietitian [phone]

## 2024-07-31 ENCOUNTER — Other Ambulatory Visit: Payer: Self-pay

## 2024-08-22 LAB — SIGNATERA
SIGNATERA MTM READOUT: 0 MTM/ml
SIGNATERA TEST RESULT: NEGATIVE

## 2024-10-18 ENCOUNTER — Other Ambulatory Visit: Payer: Self-pay | Admitting: Family Medicine

## 2024-10-28 ENCOUNTER — Inpatient Hospital Stay: Admitting: Oncology

## 2024-10-28 ENCOUNTER — Encounter: Payer: Self-pay | Admitting: Oncology

## 2024-10-28 ENCOUNTER — Inpatient Hospital Stay: Attending: Oncology

## 2024-10-28 ENCOUNTER — Ambulatory Visit: Payer: Self-pay | Admitting: Oncology

## 2024-10-28 VITALS — BP 121/65 | HR 61 | Temp 97.6°F | Resp 18 | Wt 154.1 lb

## 2024-10-28 DIAGNOSIS — Z809 Family history of malignant neoplasm, unspecified: Secondary | ICD-10-CM

## 2024-10-28 DIAGNOSIS — D509 Iron deficiency anemia, unspecified: Secondary | ICD-10-CM | POA: Diagnosis not present

## 2024-10-28 DIAGNOSIS — C184 Malignant neoplasm of transverse colon: Secondary | ICD-10-CM

## 2024-10-28 DIAGNOSIS — E876 Hypokalemia: Secondary | ICD-10-CM

## 2024-10-28 LAB — CMP (CANCER CENTER ONLY)
ALT: 7 U/L (ref 0–44)
AST: 19 U/L (ref 15–41)
Albumin: 4.4 g/dL (ref 3.5–5.0)
Alkaline Phosphatase: 115 U/L (ref 38–126)
Anion gap: 11 (ref 5–15)
BUN: 20 mg/dL (ref 8–23)
CO2: 28 mmol/L (ref 22–32)
Calcium: 10.3 mg/dL (ref 8.9–10.3)
Chloride: 97 mmol/L — ABNORMAL LOW (ref 98–111)
Creatinine: 1.01 mg/dL — ABNORMAL HIGH (ref 0.44–1.00)
GFR, Estimated: 59 mL/min — ABNORMAL LOW
Glucose, Bld: 100 mg/dL — ABNORMAL HIGH (ref 70–99)
Potassium: 3.9 mmol/L (ref 3.5–5.1)
Sodium: 136 mmol/L (ref 135–145)
Total Bilirubin: 0.3 mg/dL (ref 0.0–1.2)
Total Protein: 7.9 g/dL (ref 6.5–8.1)

## 2024-10-28 LAB — CBC WITH DIFFERENTIAL (CANCER CENTER ONLY)
Abs Immature Granulocytes: 0.02 K/uL (ref 0.00–0.07)
Basophils Absolute: 0.1 K/uL (ref 0.0–0.1)
Basophils Relative: 1 %
Eosinophils Absolute: 0.2 K/uL (ref 0.0–0.5)
Eosinophils Relative: 2 %
HCT: 39.1 % (ref 36.0–46.0)
Hemoglobin: 12.5 g/dL (ref 12.0–15.0)
Immature Granulocytes: 0 %
Lymphocytes Relative: 33 %
Lymphs Abs: 2.3 K/uL (ref 0.7–4.0)
MCH: 24.1 pg — ABNORMAL LOW (ref 26.0–34.0)
MCHC: 32 g/dL (ref 30.0–36.0)
MCV: 75.5 fL — ABNORMAL LOW (ref 80.0–100.0)
Monocytes Absolute: 0.5 K/uL (ref 0.1–1.0)
Monocytes Relative: 7 %
Neutro Abs: 4 K/uL (ref 1.7–7.7)
Neutrophils Relative %: 57 %
Platelet Count: 317 K/uL (ref 150–400)
RBC: 5.18 MIL/uL — ABNORMAL HIGH (ref 3.87–5.11)
RDW: 13.7 % (ref 11.5–15.5)
WBC Count: 7 K/uL (ref 4.0–10.5)
nRBC: 0 % (ref 0.0–0.2)

## 2024-10-28 LAB — IRON AND TIBC
Iron: 41 ug/dL (ref 28–170)
Saturation Ratios: 10 % — ABNORMAL LOW (ref 10.4–31.8)
TIBC: 402 ug/dL (ref 250–450)
UIBC: 361 ug/dL

## 2024-10-28 LAB — FERRITIN: Ferritin: 28 ng/mL (ref 11–307)

## 2024-10-28 NOTE — Assessment & Plan Note (Addendum)
 Pathology results were reviewed and discussed with patient and daughter. 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  No role of adjuvant chemotherapy. Recommend Signatera testing Q3 months.   Obtain CT chest abdomen pelvis Q6 months

## 2024-10-28 NOTE — Progress Notes (Signed)
 Robin Bass                                          MRN: [id number]   10/28/2024   The [facility] Quality Team Specialist reviewed this patient medical record for the purposes of chart review for care gap closure. The following were reviewed: abstraction for care gap closure-glycemic status assessment.    [facility] Quality Team

## 2024-10-28 NOTE — Progress Notes (Signed)
 Pt here for follow up. Reports that she has not taken her vitamins because no one has told her to restart them.

## 2024-10-28 NOTE — Progress Notes (Signed)
 " Hematology/Oncology Progress note [phone] [phone]        REFERRING PROVIDER: Watt Mirza, MD    CHIEF COMPLAINTS/PURPOSE OF CONSULTATION:  Transverse colon cancer  ASSESSMENT & PLAN:   Cancer Staging  Cancer of transverse colon [facility]) Staging form: Colon and Rectum, AJCC 8th Edition - Clinical stage from 06/04/2024: Stage Unknown (cTX, cN0, cM0) - Signed by Babara Call, MD on 06/04/2024 - Pathologic stage from 06/26/2024: Stage IIA (pT3, pN0, cM0) - Signed by Babara Call, MD on 06/26/2024    Cancer of transverse colon [facility]) Pathology results were reviewed and discussed with patient and daughter. 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  No role of adjuvant chemotherapy. Recommend Signatera testing Q3 months.   Obtain CT chest abdomen pelvis Q6 months  Iron deficiency anemia Lab Results  Component Value Date   HGB 12.5 10/28/2024   TIBC 402 10/28/2024   IRONPCTSAT 10 (L) 10/28/2024   FERRITIN 28 10/28/2024    Hemoglobin has normalized. Still mildly decreased iron saturation.  Recommend iron contained multi vitamin 1 tab daily.  Chronic microcytosis, check hemoglobinopathy evaluation   Orders Placed This Encounter  Procedures   CT CHEST ABDOMEN PELVIS W CONTRAST    Standing Status:   Future    Expected Date:   01/26/2025    Expiration Date:   10/28/2025    If indicated for the ordered procedure, I authorize the administration of contrast media per Radiology protocol:   Yes    Does the patient have a contrast media/X-ray dye allergy?:   No    Preferred imaging location?:   [facility]    If indicated for the ordered procedure, I authorize the administration of oral contrast media per Radiology protocol:   Yes   CMP (Cancer Center only)    Standing Status:   Future    Expected Date:   01/26/2025    Expiration Date:   04/26/2025   CBC with Differential  (Cancer Center Only)    Standing Status:   Future    Expected Date:   01/26/2025    Expiration Date:   04/26/2025   Iron and TIBC    Standing Status:   Future    Expected Date:   01/26/2025    Expiration Date:   04/26/2025   Ferritin    Standing Status:   Future    Expected Date:   01/26/2025    Expiration Date:   04/26/2025   Retic Panel    Standing Status:   Future    Expected Date:   01/26/2025    Expiration Date:   04/26/2025   CEA    Standing Status:   Future    Expected Date:   01/26/2025    Expiration Date:   04/26/2025   Genetic Screening Order    Signatera    Standing Status:   Future    Expected Date:   01/26/2025    Expiration Date:   04/26/2025   Follow-up 3 months All questions were answered. The patient knows to call the clinic with any problems, questions or concerns.  Call Babara, MD, PhD [facility] Hematology Oncology 10/28/2024    HISTORY OF PRESENTING ILLNESS:  Robin Bass 74 y.o. female presents to establish care for transverse colon cancer. I have reviewed her chart and materials related to her cancer extensively and collaborated history with the patient. Summary of oncologic history is as follows: Oncology History  Cancer of transverse colon (HCC)  05/28/2024 Imaging   CT chest abdomen pelvis with contrast showed  1. Asymmetric wall thickening of the proximal transverse colon with pericolonic inflammation, consistent with recent colon cancer diagnosis. Given the adjacent inflammation, this is worrisome for early transmural extension. Follow up colonoscopy recommended for confirmation. 2. Numerous subcentimeter hypodensities throughout the spleen possibly small cyst or hemangiomas. Nonemergent multiphase abdominal MRI with iv contrast recommended for further characterization to exclude underlying neoplasm or metastatic disease. 3. No intraabdominal or pelvic lymphadenopathy. No distant metastatic disease within the chest. 4. Scattered colonic diverticulosis without  evidence of diverticulitis   06/04/2024 Initial Diagnosis   Cancer of transverse colon  Patient was found to have New onset of microcytic anemia. Subsequent workup including Cologuard was positive. 05/31/2024, colonoscopy showed 270 pedunculated polyps in the sigmoid colon.  Resected and retrieved.  Infiltrative sessile and ulcerated partially obstructing large mass in the proximal transverse colon.  Mass was partially circumferential, no bleeding was present.  Mass was biopsied.  Pathology showed - Proximal transverse colon mass-ulcerated moderately differentiated adenocarcinoma. - Sigmoid polyps -tubular adenoma.  Negative for high-grade dysplasia and malignancy.  Pathology showed      06/04/2024 Cancer Staging   Staging form: Colon and Rectum, AJCC 8th Edition - Clinical stage from 06/04/2024: Stage Unknown (cTX, cN0, cM0) - Signed by Babara Call, MD on 06/04/2024 Stage prefix: Initial diagnosis Total positive nodes: 0   06/17/2024 Surgery   Status post colon cancer resection.  Pathology showed  Colon, segmental resection for tumor, right - INVASIVE ADENOCARCINOMA, MODERATELY DIFFERENTIATED, 6.4 CM, INVADING PERICOLONIC ADIPOSE TISSUE (SEE SYNOPTIC REPORT). - MARGINS NEGATIVE FOR CARCINOMA OR DYSPLASIA. - THREE TUBULAR ADENOMAS AND ONE HYPERPLASTIC POLYP. - NINETEEN LYMPH NODES, ALL NEGATIVE FOR CARCINOMA (0/19). - APPENDIX WITH FIBROUS OBLITERATION OF THE LUMEN AND ENDOMETRIOSIS.  pMMR  TUMOR Tumor Site: Transverse colon, proximal Histologic Type: Adenocarcinoma Histologic Grade: G2, moderately differentiated Tumor Size: Greatest dimension: 6.4 cm Multiple Primary Sites: Not applicable (no additional primary sites present) Tumor Extent: Invades through muscularis propria into the pericolic or perirectal tissue Sub-mucosal Invasion: Not applicable (not a pT1 tumor) Macroscopic Tumor Perforation: Not identified Lymphatic and/or Vascular Invasion: Not identified Perineural  Invasion: Not identified Tumor Budding Score: Intermediate (5-9) Number of tumor buds (per hotspot field): 7 Treatment Effect: No known presurgical therapy  MARGINS Margin Status for Invasive Carcinoma: All margins negative for invasive carcinoma Margin Status for Non-Invasive Tumor: All margins negative for high-grade dysplasia/intramucosal carcinoma and low-grade dysplasia REGIONAL LYMPH NODES Regional Lymph Node Status: Regional lymph nodes present All regional lymph nodes negative for tumor - Number of lymph nodes examined: 19 Tumor Deposits: Not identified DISTANT METASTASES Distant Site(s) Involved, if applicable: Not applicable PATHOLOGIC STAGE CLASSIFICATION (pTNM, AJCC 8th Edition): Modified Classification: Not applicable pT3 - T suffix: Not applicable pN0 pM not applicable ADDITIONAL FINDINGS Adenomas     06/26/2024 Cancer Staging   Staging form: Colon and Rectum, AJCC 8th Edition - Pathologic stage from 06/26/2024: Stage IIA (pT3, pN0, cM0) - Signed by Babara Call, MD on 06/26/2024 Stage prefix: Initial diagnosis Total positive nodes: 0   Patient was accompanied by daughter  Status post colon surgery.  She reports feeling well today. No nausea vomiting.  Fatigue has improved. No blood in stool or abdominal pain.   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    SURGICAL HISTORY: Past Surgical History:  Procedure Laterality Date   ABDOMINAL HYSTERECTOMY  2005   MOUTH SURGERY  2005   floor of the mouth,  tumor removed    SOCIAL HISTORY: Social History   Socioeconomic History   Marital status: Married    Spouse name: Not on file   Number of children: 1   Years of education: Not on file   Highest education level: Not on file  Occupational History   Occupation: Producer, Television/film/video: UNEMPLOYED  Tobacco Use   Smoking status: Never   Smokeless tobacco: Never  Vaping Use   Vaping status: Never Used  Substance and Sexual Activity   Alcohol use: No   Drug use: No   Sexual activity: Not on file  Other Topics Concern   Not on file  Social History Narrative   Not on file   Social Drivers of Health   Tobacco Use: Low Risk (10/28/2024)   Patient History    Smoking Tobacco Use: Never    Smokeless Tobacco Use: Never    Passive Exposure: Not on file  Financial Resource Strain: Low Risk  (09/11/2024)   Received from [facility]   Overall Financial Resource Strain (CARDIA)    Difficulty of Paying Living Expenses: Not hard at all  Food Insecurity: No Food Insecurity (09/11/2024)   Received from [facility]   Epic    Within the past 12 months, you worried that your food would run out before you got the money to buy more.: Never true    Within the past 12 months, the food you bought just didn't last and you didn't have money to get more.: Never true  Transportation Needs: No Transportation Needs (09/11/2024)   Received from [facility] - Transportation    In the past 12 months, has lack of transportation kept you from medical appointments or from getting medications?: No    Lack of Transportation (Non-Medical): No  Physical Activity: Inactive (02/27/2024)   Exercise Vital Sign    Days of Exercise per Week: 0 days    Minutes of Exercise per Session: 0 min  Stress: Stress Concern Present (02/27/2024)   [employer] - Occupational Stress Questionnaire    Feeling of Stress : To some extent  Social  Connections: Moderately Isolated (06/17/2024)   Social Connection and Isolation Panel    Frequency of Communication with Friends and Family: More than three times a week    Frequency of Social Gatherings with Friends and Family: More than three times a week    Attends Religious Services: Never    Database Administrator or Organizations: No    Attends Banker Meetings: Never    Marital Status: Married  Catering Manager Violence: Not At Risk (06/21/2024)   Epic    Fear of Current or Ex-Partner: No    Emotionally Abused: No    Physically Abused: No    Sexually Abused: No  Depression (PHQ2-9): Low Risk (07/08/2024)   Depression (PHQ2-9)  PHQ-2 Score: 3  Alcohol Screen: Low Risk (02/27/2024)   Alcohol Screen    Last Alcohol Screening Score (AUDIT): 0  Housing: Low Risk  (09/11/2024)   Received from [facility]   Epic    In the last 12 months, was there a time when you were not able to pay the mortgage or rent on time?: No    In the past 12 months, how many times have you moved where you were living?: 0    At any time in the past 12 months, were you homeless or living in a shelter (including now)?: No  Utilities: Not At Risk (09/11/2024)   Received from [facility]   Epic    In the past 12 months has the electric, gas, oil, or water company threatened to shut off services in your home?: No  Health Literacy: Adequate Health Literacy (02/27/2024)   B1300 Health Literacy    Frequency of need for help with medical instructions: Never    FAMILY HISTORY: Family History  Problem Relation Age of Onset   Hypertension Mother    Heart attack Father    Basal cell carcinoma Father    Cancer Father    Breast cancer Neg Hx     ALLERGIES:  is allergic to sulfonamide derivatives and ace inhibitors.  MEDICATIONS:  Current Outpatient Medications  Medication Sig Dispense Refill   albuterol  (VENTOLIN  HFA) 108 (90 Base) MCG/ACT inhaler Inhale 2 puffs  into the lungs every 4 (four) hours as needed for wheezing. 1 each 0   amLODipine  (NORVASC ) 10 MG tablet Take 1 tablet (10 mg total) by mouth at bedtime. 90 tablet 1   Ascorbic Acid (VITAMIN C) 1000 MG tablet Take 1,000 mg by mouth daily.     atorvastatin  (LIPITOR) 20 MG tablet TAKE ONE TABLET BY MOUTH ONCE DAILY 100 tablet 2   clindamycin (CLEOCIN T) 1 % lotion Apply 1 Application topically daily as needed (irritation).     clonazePAM  (KLONOPIN ) 0.5 MG tablet TAKE ONE TABLET (0.5 MG TOTAL) BY MOUTH TWO TIMES DAILY AS NEEDED FOR ANXIETY. 30 tablet 1   ferrous sulfate 324 MG TBEC Take 324 mg by mouth.     FLUoxetine  (PROZAC ) 20 MG capsule TAKE ONE CAPSULE BY MOUTH ONCE DAILY 90 capsule 0   levothyroxine  (SYNTHROID ) 50 MCG tablet TAKE ONE TAB BY MOUTH ONCE DAILY. TAKE ON AN EMPTY STOMACH WITH A GLASS OF WATER ATLEAST 30-60 MINUTES BEFORE BREAKFAST 90 tablet 3   losartan -hydrochlorothiazide  (HYZAAR) 100-25 MG tablet TAKE ONE TABLET BY MOUTH ONCE DAILY 90 tablet 1   meloxicam  (MOBIC ) 15 MG tablet TAKE ONE TABLET (15 MG TOTAL) BY MOUTH DAILY. 30 tablet 3   metFORMIN  (GLUCOPHAGE -XR) 500 MG 24 hr tablet Take 2 tablets (1,000 mg total) by mouth daily with breakfast. 180 tablet 1   ONE TOUCH ULTRA TEST test strip USE TO CHECK BLOOD SUGAR TWICE A DAY AND AS DIRECTED 100 each 6   ONETOUCH DELICA LANCETS 33G MISC Use to check blood sugar two times a day 100 each 11   potassium chloride  SA (KLOR-CON  M) 20 MEQ tablet Take 1 tablet (20 mEq total) by mouth daily. 3 tablet 0   Cholecalciferol (VITAMIN D ) 50 MCG (2000 UT) CAPS Take 2,000 Units by mouth daily. (Patient not taking: Reported on 10/28/2024)     cyanocobalamin  (VITAMIN B12) 1000 MCG tablet Take 1,000 mcg by mouth daily. (Patient not taking: Reported on 10/28/2024)     Vitamin D , Cholecalciferol,  25 MCG (1000 UT) TABS Take by mouth. Takes 1/2 (Patient not taking: Reported on 10/28/2024)     No current facility-administered medications for this visit.     Review of Systems  Constitutional:  Negative for appetite change, chills, fatigue and fever.  HENT:   Negative for hearing loss and voice change.   Eyes:  Negative for eye problems.  Respiratory:  Negative for chest tightness and cough.   Cardiovascular:  Negative for chest pain.  Gastrointestinal:  Negative for abdominal distention, abdominal pain and blood in stool.  Endocrine: Negative for hot flashes.  Genitourinary:  Negative for difficulty urinating and frequency.   Musculoskeletal:  Negative for arthralgias.  Skin:  Negative for itching and rash.  Neurological:  Negative for extremity weakness.  Hematological:  Negative for adenopathy.  Psychiatric/Behavioral:  Negative for confusion.      PHYSICAL EXAMINATION: ECOG PERFORMANCE STATUS: 1 - Symptomatic but completely ambulatory  Vitals:   10/28/24 1454  BP: 121/65  Pulse: 61  Resp: 18  Temp: 97.6 F (36.4 C)   Filed Weights   10/28/24 1454  Weight: 154 lb 1.6 oz (69.9 kg)    Physical Exam Constitutional:      General: She is not in acute distress.    Appearance: She is not diaphoretic.  HENT:     Head: Normocephalic and atraumatic.  Eyes:     General: No scleral icterus. Cardiovascular:     Rate and Rhythm: Normal rate and regular rhythm.  Pulmonary:     Effort: Pulmonary effort is normal. No respiratory distress.     Breath sounds: Normal breath sounds.  Abdominal:     General: There is no distension.     Palpations: Abdomen is soft.     Tenderness: There is no abdominal tenderness.  Musculoskeletal:        General: Normal range of motion.     Cervical back: Normal range of motion and neck supple.  Skin:    General: Skin is warm and dry.     Findings: No erythema.  Neurological:     Mental Status: She is alert and oriented to person, place, and time. Mental status is at baseline.     Motor: No abnormal muscle tone.  Psychiatric:        Mood and Affect: Affect normal.      LABORATORY DATA:   I have reviewed the data as listed    Latest Ref Rng & Units 10/28/2024    2:22 PM 06/19/2024    5:20 AM 06/18/2024    4:14 AM  CBC  WBC 4.0 - 10.5 K/uL 7.0  9.3  10.3   Hemoglobin 12.0 - 15.0 g/dL 87.4  9.9  9.4   Hematocrit 36.0 - 46.0 % 39.1  32.6  30.4   Platelets 150 - 400 K/uL 317  348  366       Latest Ref Rng & Units 10/28/2024    2:22 PM 06/19/2024    5:20 AM 06/18/2024    4:14 AM  CMP  Glucose 70 - 99 mg/dL 899  884  802   BUN 8 - 23 mg/dL 20  11  14    Creatinine 0.44 - 1.00 mg/dL 8.98  9.23  9.12   Sodium 135 - 145 mmol/L 136  141  138   Potassium 3.5 - 5.1 mmol/L 3.9  4.0  3.5   Chloride 98 - 111 mmol/L 97  105  108   CO2 22 - 32 mmol/L 28  28  24   Calcium  8.9 - 10.3 mg/dL 89.6  9.0  8.6   Total Protein 6.5 - 8.1 g/dL 7.9     Total Bilirubin 0.0 - 1.2 mg/dL 0.3     Alkaline Phos 38 - 126 U/L 115     AST 15 - 41 U/L 19     ALT 0 - 44 U/L 7        RADIOGRAPHIC STUDIES: I have personally reviewed the radiological images as listed and agreed with the findings in the report. No results found.   "

## 2024-10-28 NOTE — Assessment & Plan Note (Addendum)
 Lab Results  Component Value Date   HGB 12.5 10/28/2024   TIBC 402 10/28/2024   IRONPCTSAT 10 (L) 10/28/2024   FERRITIN 28 10/28/2024    Hemoglobin has normalized. Still mildly decreased iron saturation.  Recommend iron contained multi vitamin 1 tab daily.  Chronic microcytosis, check hemoglobinopathy evaluation

## 2024-10-29 LAB — GENETIC SCREENING ORDER

## 2024-10-29 LAB — CEA: CEA: 0.7 ng/mL (ref 0.0–4.7)

## 2024-10-29 NOTE — Progress Notes (Signed)
 Call made to pt, no answer. Detailed message left. Call back number left for pt to return call if there are any questions.

## 2024-11-04 LAB — SIGNATERA
SIGNATERA MTM READOUT: 0 MTM/ml
SIGNATERA TEST RESULT: NEGATIVE

## 2024-11-20 ENCOUNTER — Other Ambulatory Visit: Payer: Self-pay | Admitting: Family Medicine

## 2024-11-20 NOTE — Telephone Encounter (Signed)
 Last office visit 07/08/2024 for DM.  Last refilled 07/18/2024 for #30 with 3 refills. Next Appt: 01/06/25 for FM>

## 2024-11-28 ENCOUNTER — Other Ambulatory Visit: Payer: Self-pay | Admitting: Family Medicine

## 2025-01-06 ENCOUNTER — Ambulatory Visit: Admitting: Family Medicine

## 2025-01-24 ENCOUNTER — Other Ambulatory Visit

## 2025-02-03 ENCOUNTER — Inpatient Hospital Stay: Admitting: Oncology

## 2025-02-24 ENCOUNTER — Ambulatory Visit

## 2025-02-27 ENCOUNTER — Ambulatory Visit
